# Patient Record
Sex: Female | Born: 1945
Health system: Southern US, Community
[De-identification: ages and names within clinical notes are randomized; demographics above are authoritative.]

## PROBLEM LIST (undated history)

## (undated) DIAGNOSIS — I700 Atherosclerosis of aorta: Secondary | ICD-10-CM

## (undated) DIAGNOSIS — M810 Age-related osteoporosis without current pathological fracture: Secondary | ICD-10-CM

## (undated) DIAGNOSIS — J439 Emphysema, unspecified: Secondary | ICD-10-CM

## (undated) DIAGNOSIS — T7840XA Allergy, unspecified, initial encounter: Secondary | ICD-10-CM

## (undated) DIAGNOSIS — Z8619 Personal history of other infectious and parasitic diseases: Secondary | ICD-10-CM

## (undated) HISTORY — DX: Emphysema, unspecified: J43.9

## (undated) HISTORY — PX: AUGMENTATION MAMMAPLASTY: SUR837

## (undated) HISTORY — DX: Age-related osteoporosis without current pathological fracture: M81.0

## (undated) HISTORY — DX: Allergy, unspecified, initial encounter: T78.40XA

## (undated) HISTORY — PX: BREAST BIOPSY: SHX20

## (undated) HISTORY — PX: ABDOMINAL HYSTERECTOMY: SHX81

## (undated) HISTORY — DX: Personal history of other infectious and parasitic diseases: Z86.19

## (undated) HISTORY — PX: COSMETIC SURGERY: SHX468

## (undated) HISTORY — DX: Atherosclerosis of aorta: I70.0

## (undated) HISTORY — PX: TONSILLECTOMY: SUR1361

---

## 1973-05-10 HISTORY — PX: BREAST ENHANCEMENT SURGERY: SHX7

## 1997-12-20 ENCOUNTER — Emergency Department (HOSPITAL_COMMUNITY): Admission: EM | Admit: 1997-12-20 | Discharge: 1997-12-20 | Payer: Self-pay | Admitting: Emergency Medicine

## 2000-03-08 ENCOUNTER — Other Ambulatory Visit: Admission: RE | Admit: 2000-03-08 | Discharge: 2000-03-08 | Payer: Self-pay | Admitting: Obstetrics and Gynecology

## 2001-03-20 ENCOUNTER — Other Ambulatory Visit: Admission: RE | Admit: 2001-03-20 | Discharge: 2001-03-20 | Payer: Self-pay | Admitting: Obstetrics and Gynecology

## 2001-03-31 ENCOUNTER — Encounter: Admission: RE | Admit: 2001-03-31 | Discharge: 2001-03-31 | Payer: Self-pay | Admitting: Obstetrics and Gynecology

## 2001-03-31 ENCOUNTER — Encounter: Payer: Self-pay | Admitting: Obstetrics and Gynecology

## 2001-09-13 ENCOUNTER — Encounter: Admission: RE | Admit: 2001-09-13 | Discharge: 2001-09-13 | Payer: Self-pay | Admitting: Obstetrics and Gynecology

## 2001-09-13 ENCOUNTER — Encounter: Payer: Self-pay | Admitting: Obstetrics and Gynecology

## 2002-04-09 ENCOUNTER — Other Ambulatory Visit: Admission: RE | Admit: 2002-04-09 | Discharge: 2002-04-09 | Payer: Self-pay | Admitting: Obstetrics and Gynecology

## 2004-06-24 ENCOUNTER — Other Ambulatory Visit: Admission: RE | Admit: 2004-06-24 | Discharge: 2004-06-24 | Payer: Self-pay | Admitting: Obstetrics and Gynecology

## 2005-08-12 ENCOUNTER — Other Ambulatory Visit: Admission: RE | Admit: 2005-08-12 | Discharge: 2005-08-12 | Payer: Self-pay | Admitting: Obstetrics and Gynecology

## 2008-11-16 ENCOUNTER — Emergency Department (HOSPITAL_BASED_OUTPATIENT_CLINIC_OR_DEPARTMENT_OTHER): Admission: EM | Admit: 2008-11-16 | Discharge: 2008-11-16 | Payer: Self-pay | Admitting: Emergency Medicine

## 2008-12-25 ENCOUNTER — Ambulatory Visit: Payer: Self-pay | Admitting: Internal Medicine

## 2008-12-25 DIAGNOSIS — L259 Unspecified contact dermatitis, unspecified cause: Secondary | ICD-10-CM | POA: Insufficient documentation

## 2008-12-25 DIAGNOSIS — Z87891 Personal history of nicotine dependence: Secondary | ICD-10-CM | POA: Insufficient documentation

## 2008-12-25 DIAGNOSIS — F17200 Nicotine dependence, unspecified, uncomplicated: Secondary | ICD-10-CM | POA: Insufficient documentation

## 2008-12-25 DIAGNOSIS — F3289 Other specified depressive episodes: Secondary | ICD-10-CM | POA: Insufficient documentation

## 2008-12-25 DIAGNOSIS — F329 Major depressive disorder, single episode, unspecified: Secondary | ICD-10-CM | POA: Insufficient documentation

## 2008-12-25 DIAGNOSIS — M25429 Effusion, unspecified elbow: Secondary | ICD-10-CM | POA: Insufficient documentation

## 2008-12-31 ENCOUNTER — Encounter (INDEPENDENT_AMBULATORY_CARE_PROVIDER_SITE_OTHER): Payer: Self-pay | Admitting: *Deleted

## 2009-01-06 ENCOUNTER — Encounter (INDEPENDENT_AMBULATORY_CARE_PROVIDER_SITE_OTHER): Payer: Self-pay | Admitting: *Deleted

## 2009-01-30 ENCOUNTER — Ambulatory Visit: Payer: Self-pay | Admitting: Internal Medicine

## 2009-01-31 ENCOUNTER — Telehealth (INDEPENDENT_AMBULATORY_CARE_PROVIDER_SITE_OTHER): Payer: Self-pay | Admitting: *Deleted

## 2009-07-09 ENCOUNTER — Ambulatory Visit: Payer: Self-pay | Admitting: Internal Medicine

## 2009-07-10 LAB — CONVERTED CEMR LAB: Hgb A1c MFr Bld: 5.9 % (ref 4.6–6.5)

## 2010-06-07 LAB — CONVERTED CEMR LAB
ALT: 20 units/L (ref 0–35)
AST: 19 units/L (ref 0–37)
Albumin: 4 g/dL (ref 3.5–5.2)
Alkaline Phosphatase: 95 units/L (ref 39–117)
BUN: 13 mg/dL (ref 6–23)
Basophils Absolute: 0 10*3/uL (ref 0.0–0.1)
Basophils Relative: 0 % (ref 0.0–3.0)
Bilirubin Urine: NEGATIVE
Bilirubin, Direct: 0 mg/dL (ref 0.0–0.3)
Blood in Urine, dipstick: NEGATIVE
CO2: 32 meq/L (ref 19–32)
Calcium: 9.4 mg/dL (ref 8.4–10.5)
Chloride: 106 meq/L (ref 96–112)
Cholesterol: 201 mg/dL — ABNORMAL HIGH (ref 0–200)
Creatinine, Ser: 0.7 mg/dL (ref 0.4–1.2)
Direct LDL: 109.5 mg/dL
Eosinophils Absolute: 0 10*3/uL (ref 0.0–0.7)
Eosinophils Relative: 0.3 % (ref 0.0–5.0)
GFR calc non Af Amer: 90.18 mL/min (ref >60)
Glucose, Bld: 109 mg/dL — ABNORMAL HIGH (ref 70–99)
Glucose, Urine, Semiquant: NEGATIVE
HCT: 45.9 % (ref 36.0–46.0)
HDL: 72.9 mg/dL (ref >39.00)
Hemoglobin: 15.4 g/dL — ABNORMAL HIGH (ref 12.0–15.0)
Hgb A1c MFr Bld: 6.1 % (ref 4.6–6.5)
Ketones, urine, test strip: NEGATIVE
Lymphocytes Relative: 12.5 % (ref 12.0–46.0)
Lymphs Abs: 1.5 10*3/uL (ref 0.7–4.0)
MCHC: 33.6 g/dL (ref 30.0–36.0)
MCV: 92.1 fL (ref 78.0–100.0)
Monocytes Absolute: 0.3 10*3/uL (ref 0.1–1.0)
Monocytes Relative: 2.8 % — ABNORMAL LOW (ref 3.0–12.0)
Neutro Abs: 10.1 10*3/uL — ABNORMAL HIGH (ref 1.4–7.7)
Neutrophils Relative %: 84.4 % — ABNORMAL HIGH (ref 43.0–77.0)
Nitrite: NEGATIVE
Platelets: 311 10*3/uL (ref 150.0–400.0)
Potassium: 5.1 meq/L (ref 3.5–5.1)
Protein, U semiquant: NEGATIVE
RBC: 4.98 M/uL (ref 3.87–5.11)
RDW: 14 % (ref 11.5–14.6)
Sodium: 143 meq/L (ref 135–145)
Specific Gravity, Urine: <1.005
TSH: 0.87 microintl units/mL (ref 0.35–5.50)
Total Bilirubin: 0.9 mg/dL (ref 0.3–1.2)
Total CHOL/HDL Ratio: 3
Total Protein: 7.6 g/dL (ref 6.0–8.3)
Triglycerides: 68 mg/dL (ref 0.0–149.0)
Urobilinogen, UA: 0.2
VLDL: 13.6 mg/dL (ref 0.0–40.0)
WBC Urine, dipstick: NEGATIVE
WBC: 11.9 10*3/uL — ABNORMAL HIGH (ref 4.5–10.5)
pH: 6

## 2010-06-09 NOTE — Assessment & Plan Note (Signed)
Summary: NEW PT/WANTS A CPX/UHC/KDC   Vital Signs:  Patient profile:   65 year old female Height:      67.25 inches Weight:      179.2 pounds BMI:     27.96 Temp:     97.9 degrees F oral Pulse rate:   72 / minute Resp:     14 per minute BP sitting:   112 / 74  (left arm) Cuff size:   large  Vitals Entered By: Shonna Chock (December 25, 2008 10:56 AM)  Comments REVIEWED MED LIST, PATIENT AGREED DOSE AND INSTRUCTION CORRECT    History of Present Illness: Recovering from Contact Dermatitis .  Preventive Screening-Counseling & Management  Alcohol-Tobacco     Smoking Status: current  Caffeine-Diet-Exercise     Does Patient Exercise: yes  Allergies (verified): No Known Drug Allergies  Past History:  Past Medical History: Contact Dermatitis to Lever 2000 & Latex, complicated by Staph infection (no MRSA) Depression  Past Surgical History: G2 P2; Cosmetic Surgery : Blephoroplasty, Nasal Reconstruction, Mammoplasy; Hysterectomy for fibroids Tonsillectomy  Family History: Father:DECEASED ,CAD, MI @ 70 Mother: DECEASED-UTERINE CANCER AGE 85 Siblings: 5 SISTERS (LIVING) SON: DECEASED AGE 48-MI @ 42 DAUGHTER: LIVING (ASTHMA)  Social History: Occupation:[employer]  Current Smoker:2 ppd Alcohol use-yes:occa Regular exercise-yes: push mowing Smoking Status:  current Does Patient Exercise:  yes  Review of Systems General:  Complains of sleep disorder and sweats; denies chills and fever; Weight  loss of 27# with diet. 2 benadryl at bedtime . Eyes:  Denies blurring, double vision, and vision loss-both eyes. ENT:  Denies difficulty swallowing, hoarseness, and nasal congestion. CV:  Complains of palpitations; denies chest pain or discomfort, leg cramps with exertion, shortness of breath with exertion, swelling of feet, and swelling of hands; Occa irregular beat. Resp:  Complains of cough and sputum productive; denies coughing up blood, shortness of breath, and wheezing; Scant sputum  every am. GI:  Denies abdominal pain, bloody stools, constipation, dark tarry stools, diarrhea, and indigestion; "No colonoscopy ; I just haven't scheduled one" (SOC). GU:  Denies discharge, dysuria, hematuria, and incontinence. MS:  Complains of joint redness and joint swelling; denies joint pain, low back pain, mid back pain, and thoracic pain; L elbow red & swollen X 2-3 weeks. Derm:  Complains of changes in color of skin and rash; Contact Dermatitis X 3 months from soap(Lever 2000 & latex); treated by Dr Dorita Sciara office. Neuro:  Denies headaches, numbness, and tingling. Psych:  Complains of anxiety and panic attacks; denies easily angered, easily tearful, and irritability. Endo:  Complains of heat intolerance; denies cold intolerance, excessive hunger, excessive thirst, and excessive urination. Heme:  Denies abnormal bruising and bleeding. Allergy:  Denies itching eyes, seasonal allergies, and sneezing.  Physical Exam  General:  in no acute distress; alert,appropriate and cooperative throughout examination Head:  Normocephalic and atraumatic without obvious abnormalities. No apparent alopecia . Eyes:  No corneal or conjunctival inflammation noted. Perrla. Funduscopic exam benign, without hemorrhages, exudates or papilledema.  Ears:  External ear exam shows no significant lesions or deformities.  Otoscopic examination reveals clear canals, tympanic membranes are intact bilaterally without bulging, retraction, inflammation or discharge. Hearing is grossly normal bilaterally. Nose:  External nasal examination shows no deformity or inflammation. Nasal mucosa are pink and moist without lesions or exudates. Mouth:  Oral mucosa and oropharynx without lesions or exudates.  Upper plate ; lower partial Neck:  No deformities, masses, or tenderness noted. Lungs:  Normal respiratory effort, chest expands symmetrically. Lungs  are clear to auscultation, no crackles or wheezes. BS decreased Heart:  Normal  rate and regular rhythm. S1 and S2 normal without gallop, murmur, click, rub. S4 Abdomen:  Bowel sounds positive,abdomen soft and non-tender without masses, organomegaly or hernias noted. Genitalia:  Carol Curtis,NP( Dr Arelia Sneddon) Msk:  No deformity or scoliosis noted of thoracic or lumbar spine.   Pulses:  R and L carotid,radial,dorsalis pedis and posterior tibial pulses are full and equal bilaterally Extremities:  No clubbing, cyanosis. Small effusion L elbow Neurologic:  alert & oriented X3 and DTRs symmetrical and normal.   Skin:  Erythema & hyperpigmentation Cervical Nodes:  No lymphadenopathy noted Axillary Nodes:  No palpable lymphadenopathy Psych:  memory intact for recent and remote, normally interactive, good eye contact, not anxious appearing, and not depressed appearing.     Impression & Recommendations:  Problem # 1:  HEALTH MAINTENANCE EXAM (ICD-V70.0)  Orders: UA Dipstick w/o Micro (manual) (91478) EKG w/ Interpretation (93000) Venipuncture (29562) TLB-Lipid Panel (80061-LIPID) TLB-BMP (Basic Metabolic Panel-BMET) (80048-METABOL) TLB-CBC Platelet - w/Differential (85025-CBCD) TLB-Hepatic/Liver Function Pnl (80076-HEPATIC) TLB-TSH (Thyroid Stimulating Hormone) (84443-TSH) T-2 View CXR (71020TC)  Problem # 2:  CONTACT DERMATITIS (ICD-692.9)  as per Dr Terri Piedra Her updated medication list for this problem includes:    Prednisone 5 Mg Tabs (Prednisone) .Marland Kitchen... 2 by mouth once daily (5 days left)    Triamcinolone Acetonide 0.1 % Crea (Triamcinolone acetonide) .Marland Kitchen... 1/2 with vasline and apply to affected area  Orders: Venipuncture (13086)  Problem # 3:  EFFUSION OF UPPER ARM JOINT (ICD-719.02)  Orders: T- * Misc. Laboratory test [phone])  Problem # 4:  CIGARETTE SMOKER (ICD-305.1)  Orders: T-2 View CXR (71020TC)  Problem # 5:  DEPRESSION (ICD-311)  Her updated medication list for this problem includes:    Hydroxyzine Hcl 10 Mg Tabs (Hydroxyzine hcl) .Marland Kitchen... 1 by  mouth at bedtime (itching)    Citalopram Hydrobromide 20 Mg Tabs (Citalopram hydrobromide) .Marland Kitchen... 1 once daily  Complete Medication List: 1)  Prednisone 5 Mg Tabs (Prednisone) .... 2 by mouth once daily (5 days left) 2)  Hydroxyzine Hcl 10 Mg Tabs (Hydroxyzine hcl) .Marland Kitchen.. 1 by mouth at bedtime (itching) 3)  Doxycycline Hyclate 100 Mg Tabs (Doxycycline hyclate) .Marland Kitchen.. 1 by mouth once daily (temp med) 4)  Triamcinolone Acetonide 0.1 % Crea (Triamcinolone acetonide) .... 1/2 with vasline and apply to affected area 5)  Citalopram Hydrobromide 20 Mg Tabs (Citalopram hydrobromide) .Marland Kitchen.. 1 once daily  Patient Instructions: 1)  Please report Warning Signs  related to elbow as discussed  2)  Schedule a colonoscopy as per [facility] as discussed  to help detect colon cancer. Prescriptions: CITALOPRAM HYDROBROMIDE 20 MG TABS (CITALOPRAM HYDROBROMIDE) 1 once daily  #90 x 1   Entered and Authorized by:   Marga Melnick MD   Signed by:   Marga Melnick MD on 12/25/2008   Method used:   Print then Give to Patient   RxID:   [id number]   Laboratory Results   Urine Tests    Routine Urinalysis   Color: lt. yellow Appearance: Clear Glucose: negative   (Normal Range: Negative) Bilirubin: negative   (Normal Range: Negative) Ketone: negative   (Normal Range: Negative) Spec. Gravity: <1.005   (Normal Range: 1.003-1.035) Blood: negative   (Normal Range: Negative) pH: 6.0   (Normal Range: 5.0-8.0) Protein: negative   (Normal Range: Negative) Urobilinogen: 0.2   (Normal Range: 0-1) Nitrite: negative   (Normal Range: Negative) Leukocyte Esterace: negative   (Normal Range: Negative)  Immunization History:  Tetanus/Td Immunization History:    Tetanus/Td:  tdap (11/07/2008)   Appended Document: NEW PT/WANTS A CPX/UHC/KDC After Betadine & alcohol cleaning & local Lidocaine  1% w/o; 1.5 cc dark yellow fluid aspirated from L elbow w/o complication. Sent for C&S.

## 2010-06-09 NOTE — Letter (Signed)
Summary: Results Follow up Letter  [facility] [place]  [address] [place], [place] [address]   Phone: [phone]  Fax: [phone]    01/06/2009 MRN: [id number]  Kelly Henderson [address] [place], [place]  [address]  Dear Ms. Mierzwa,  The following are the results of your recent test(s):  Test         Result    Pap Smear:        Normal _____  Not Normal _____ Comments: ______________________________________________________ Cholesterol: LDL(Bad cholesterol):         Your goal is less than:         HDL (Good cholesterol):       Your goal is more than: Comments:  ______________________________________________________ Mammogram:        Normal _____  Not Normal _____ Comments:  ___________________________________________________________________ Hemoccult:        Normal _____  Not normal _______ Comments:    _____________________________________________________________________ Other Tests: Please see culture results from 12/25/2008    We routinely do not discuss normal results over the telephone.  If you desire a copy of the results, or you have any questions about this information we can discuss them at your next office visit.   Sincerely,

## 2010-06-09 NOTE — Letter (Signed)
Summary: Results Follow up Letter  [facility] [place]  [address] [place], [place] [address]   Phone: [phone]  Fax: [phone]    12/31/2008 MRN: [id number]  Kelly Henderson [address] [place], [place]  [address]  Dear [name],  The following are the results of your recent test(s):  Test         Result    Pap Smear:        Normal _____  Not Normal _____ Comments: ______________________________________________________ Cholesterol: LDL(Bad cholesterol):         Your goal is less than:         HDL (Good cholesterol):       Your goal is more than: Comments:  ______________________________________________________ Mammogram:        Normal _____  Not Normal _____ Comments:  ___________________________________________________________________ Hemoccult:        Normal _____  Not normal _______ Comments:    _____________________________________________________________________ Other Tests: Please see attached labs done on 12/25/2008    We routinely do not discuss normal results over the telephone.  If you desire a copy of the results, or you have any questions about this information we can discuss them at your next office visit.   Sincerely, Chrae Malloy  December 31, 2008 9:27 AM

## 2010-06-09 NOTE — Progress Notes (Signed)
Summary: chest  results  Phone Note Outgoing Call   Summary of Call: left message to call  office......Marland KitchenFelecia Deloach CMA  January 31, 2009 3:13 PM   Chronic Obstructive Pulmonary Disease(emphysema, bronchitis) findings. Smoking cessation would result in slowing progression of COPD(emphysema is normal aging lung process which smoking accelerates & makes more severe). Please see me if you wish to discuss all options. Hopp   Follow-up for Phone Call        pt aware...............Marland KitchenFelecia Deloach CMA  January 31, 2009 3:17 PM

## 2013-04-10 ENCOUNTER — Emergency Department (HOSPITAL_BASED_OUTPATIENT_CLINIC_OR_DEPARTMENT_OTHER)
Admission: EM | Admit: 2013-04-10 | Discharge: 2013-04-10 | Disposition: A | Discharge status: Home / Self Care | Payer: Medicare Other | Attending: Emergency Medicine | Admitting: Emergency Medicine

## 2013-04-10 ENCOUNTER — Encounter (HOSPITAL_BASED_OUTPATIENT_CLINIC_OR_DEPARTMENT_OTHER): Payer: Self-pay | Admitting: Emergency Medicine

## 2013-04-10 DIAGNOSIS — Z79899 Other long term (current) drug therapy: Secondary | ICD-10-CM | POA: Insufficient documentation

## 2013-04-10 DIAGNOSIS — Y929 Unspecified place or not applicable: Secondary | ICD-10-CM | POA: Insufficient documentation

## 2013-04-10 DIAGNOSIS — S90569A Insect bite (nonvenomous), unspecified ankle, initial encounter: Secondary | ICD-10-CM | POA: Insufficient documentation

## 2013-04-10 DIAGNOSIS — Y9389 Activity, other specified: Secondary | ICD-10-CM | POA: Insufficient documentation

## 2013-04-10 DIAGNOSIS — F17200 Nicotine dependence, unspecified, uncomplicated: Secondary | ICD-10-CM | POA: Insufficient documentation

## 2013-04-10 DIAGNOSIS — W57XXXA Bitten or stung by nonvenomous insect and other nonvenomous arthropods, initial encounter: Secondary | ICD-10-CM

## 2013-04-10 MED ORDER — DOXYCYCLINE HYCLATE 100 MG PO CAPS: 100.0000 mg | ORAL_CAPSULE | Freq: Two times a day (BID) | ORAL | Status: DC

## 2013-04-10 NOTE — ED Provider Notes (Signed)
CSN: [id number]     Arrival date & time 04/10/13  1917 History   First MD Initiated Contact with Patient 04/10/13 1954     Chief Complaint  Patient presents with  . Tick Removal   (Consider location/radiation/quality/duration/timing/severity/associated sxs/prior Treatment) HPI Comments: Pt reports she found a tick on right leg.  Unable to remove at home.   Red area around tick  The history is provided by the patient. No language interpreter was used.    History reviewed. No pertinent past medical history. Past Surgical History  Procedure Laterality Date  . Abdominal hysterectomy    . Tonsillectomy     No family history on file. History  Substance Use Topics  . Smoking status: Current Every Day Smoker -- 2.00 packs/day  . Smokeless tobacco: Not on file  . Alcohol Use: No   OB History   Grav Para Term Preterm Abortions TAB SAB Ect Mult Living                 Review of Systems  Skin: Positive for color change.  All other systems reviewed and are negative.    Allergies  Morphine and related  Home Medications   Current Outpatient Rx  Name  Route  Sig  Dispense  Refill  . diphenhydrAMINE (SOMINEX) 25 MG tablet   Oral   Take 50 mg by mouth at bedtime.         Marland Kitchen doxycycline (VIBRAMYCIN) 100 MG capsule   Oral   Take 1 capsule (100 mg total) by mouth 2 (two) times daily.   20 capsule   0    BP 105/89  Pulse 96  Temp(Src) 98.1 F (36.7 C) (Oral)  Resp 20  Ht 5\' 7"  (1.702 m)  Wt 186 lb (84.369 kg)  BMI 29.12 kg/m2  SpO2 98% Physical Exam  Nursing note and vitals reviewed. Constitutional: She is oriented to person, place, and time. She appears well-developed and well-nourished.  Musculoskeletal:  Tick right anterior leg  Neurological: She is alert and oriented to person, place, and time. She has normal reflexes.  Skin: There is erythema.  Psychiatric: She has a normal mood and affect.    ED Course  Procedures (including critical care time) Labs  Review Labs Reviewed - No data to display Imaging Review No results found.  EKG Interpretation   None       MDM   1. Tick bite    I removed tick with forcep,  Small tick broke into pieces,  I used ophthalmoscope to maginfy to look for embedded pieces,  None seen.   Pt has 1/2 dollar size area of redness.   Pt placed on doxycycline.   Pt advised to return if any problems.    Lonia Skinner [place], PA-C 04/10/13 2128

## 2013-04-10 NOTE — ED Notes (Signed)
Found tick on right leg just above knee while showering tonight. Completely embedded head.

## 2013-04-10 NOTE — Discharge Instructions (Signed)
Tick Bite Information  Ticks are insects that attach themselves to the skin and draw blood for food. There are various types of ticks. Common types include wood ticks and deer ticks. Most ticks live in shrubs and grassy areas. Ticks can climb onto your body when you make contact with leaves or grass where the tick is waiting. The most common places on the body for ticks to attach themselves are the scalp, neck, armpits, waist, and groin.  Most tick bites are harmless, but sometimes ticks carry germs that cause diseases. These germs can be spread to a person during the tick's feeding process. The chance of a disease spreading through a tick bite depends on:    The type of tick.   Time of year.    How long the tick is attached.    Geographic location.   HOW CAN YOU PREVENT TICK BITES?  Take these steps to help prevent tick bites when you are outdoors:   Wear protective clothing. Long sleeves and long pants are best.    Wear white clothes so you can see ticks more easily.   Tuck your pant legs into your socks.    If walking on a trail, stay in the middle of the trail to avoid brushing against bushes.   Avoid walking through areas with long grass.   Put insect repellent on all exposed skin and along boot tops, pant legs, and sleeve cuffs.    Check clothing, hair, and skin repeatedly and before going inside.    Brush off any ticks that are not attached.   Take a shower or bath as soon as possible after being outdoors.   WHAT IS THE PROPER WAY TO REMOVE A TICK?  Ticks should be removed as soon as possible to help prevent diseases caused by tick bites.  1. If latex gloves are available, put them on before trying to remove a tick.   2. Using fine-point tweezers, grasp the tick as close to the skin as possible. You may also use curved forceps or a tick removal tool. Grasp the tick as close to its head as possible. Avoid grasping the tick on its body.  3. Pull gently with steady upward pressure until  the tick lets go. Do not twist the tick or jerk it suddenly. This may break off the tick's head or mouth parts.   4. Do not squeeze or crush the tick's body. This could force disease-carrying fluids from the tick into your body.   5. After the tick is removed, wash the bite area and your hands with soap and water or other disinfectant such as alcohol.  6. Apply a small amount of antiseptic cream or ointment to the bite site.   7. Wash and disinfect any instruments that were used.   Do not try to remove a tick by applying a hot match, petroleum jelly, or fingernail polish to the tick. These methods do not work and may increase the chances of disease being spread from the tick bite.   WHEN SHOULD YOU SEEK MEDICAL CARE?  Contact your health care provider if you are unable to remove a tick from your skin or if a part of the tick breaks off and is stuck in the skin.   After a tick bite, you need to be aware of signs and symptoms that could be related to diseases spread by ticks. Contact your health care provider if you develop any of the following in the days or weeks after   the tick bite:   Unexplained fever.   Rash. A circular rash that appears days or weeks after the tick bite may indicate the possibility of Lyme disease. The rash may resemble a target with a bull's-eye and may occur at a different part of your body than the tick bite.   Redness and swelling in the area of the tick bite.    Tender, swollen lymph glands.    Diarrhea.    Weight loss.    Cough.    Fatigue.    Muscle, joint, or bone pain.    Abdominal pain.    Headache.    Lethargy or a change in your level of consciousness.   Difficulty walking or moving your legs.    Numbness in the legs.    Paralysis.   Shortness of breath.    Confusion.    Repeated vomiting.   Document Released: 04/23/2000 Document Revised: 12/27/2012 Document Reviewed: 10/04/2012  [employer] Patient Information 2014 [employer].

## 2013-04-11 NOTE — ED Provider Notes (Signed)
Medical screening examination/treatment/procedure(s) were performed by non-physician practitioner and as supervising physician I was immediately available for consultation/collaboration.  EKG Interpretation   None        Shon Baton, MD 04/11/13 [phone]

## 2015-04-12 ENCOUNTER — Emergency Department (HOSPITAL_COMMUNITY)
Admission: EM | Admit: 2015-04-12 | Discharge: 2015-04-12 | Disposition: A | Discharge status: Home / Self Care | Payer: Medicare (Managed Care) | Source: Home / Self Care | Attending: Family Medicine | Admitting: Family Medicine

## 2015-04-12 ENCOUNTER — Encounter (HOSPITAL_COMMUNITY): Payer: Self-pay | Admitting: Emergency Medicine

## 2015-04-12 DIAGNOSIS — H1033 Unspecified acute conjunctivitis, bilateral: Secondary | ICD-10-CM

## 2015-04-12 MED ORDER — POLYMYXIN B-TRIMETHOPRIM 10000-0.1 UNIT/ML-% OP SOLN: 1.0000 [drp] | OPHTHALMIC | Status: DC

## 2015-04-12 NOTE — ED Notes (Signed)
Reports both eyes red, watery.  Started yesterday and today includes both eyes

## 2015-04-12 NOTE — ED Provider Notes (Signed)
CSN: [id number]     Arrival date & time 04/12/15  1303 History   First MD Initiated Contact with Patient 04/12/15 1401     Chief Complaint  Patient presents with  . Eye Problem   (Consider location/radiation/quality/duration/timing/severity/associated sxs/prior Treatment) HPI Comments: 69 year old female complaining of bilateral eye redness and itching with watery discharge in [place] day. She states this morning her eyes were added shut. There is minor swelling of the lower eyelids. Currently there is no drainage. No purulence. Denies other URI or allergy symptoms.   No past medical history on file. Past Surgical History  Procedure Laterality Date  . Abdominal hysterectomy    . Tonsillectomy     No family history on file. Social History  Substance Use Topics  . Smoking status: Current Every Day Smoker -- 2.00 packs/day  . Smokeless tobacco: Not on file  . Alcohol Use: No   OB History    No data available     Review of Systems  Constitutional: Negative for fever, chills, activity change and fatigue.  HENT: Negative for congestion, ear pain, rhinorrhea, sneezing and sore throat.   Eyes: Positive for discharge, redness and itching. Negative for photophobia, pain and visual disturbance.  Respiratory: Negative.     Allergies  Morphine and related  Home Medications   Prior to Admission medications   Medication Sig Start Date End Date Taking? Authorizing Provider  diphenhydrAMINE (SOMINEX) 25 MG tablet Take 50 mg by mouth at bedtime.    Historical Provider, MD  doxycycline (VIBRAMYCIN) 100 MG capsule Take 1 capsule (100 mg total) by mouth 2 (two) times daily. 04/10/13   Fransico Meadow, PA-C  trimethoprim-polymyxin b (POLYTRIM) ophthalmic solution Place 1 drop into both eyes every 4 (four) hours. 04/12/15   Janne Napoleon, NP   Meds Ordered and Administered this Visit  Medications - No data to display  BP 128/82 mmHg  Pulse 75  Temp(Src) 97.9 F (36.6 C) (Oral)  SpO2 96% No  data found.   Physical Exam  Constitutional: She is oriented to person, place, and time. She appears well-developed and well-nourished.  HENT:  Mouth/Throat: No oropharyngeal exudate.  Minor erythema of the posterior pharynx. No exudates or swelling.  Eyes: EOM are normal. Pupils are equal, round, and reactive to light.  Bilateral lower conjunctival erythema and minor swelling. No swelling of the upper lids. No other. Orbital edema. Sclera mildly injected. No purulent drainage or other drainage noted at this time.  Neck: Normal range of motion. Neck supple.  Cardiovascular: Normal rate.   Pulmonary/Chest: Effort normal. No respiratory distress.  Lymphadenopathy:    She has no cervical adenopathy.  Neurological: She is alert and oriented to person, place, and time. She exhibits normal muscle tone.  Skin: Skin is warm and dry.  Psychiatric: She has a normal mood and affect.  Nursing note and vitals reviewed.   ED Course  Procedures (including critical care time)  Labs Review Labs Reviewed - No data to display  Imaging Review No results found.   Visual Acuity Review  Right Eye Distance:   Left Eye Distance:   Bilateral Distance:    Right Eye Near:   Left Eye Near:    Bilateral Near:         MDM   1. Conjunctivitis, acute, bilateral    Likely viral Use Zaditor eye drops twice a day as needed Only use the antibacterial eye drops for worsening, white draining pus, increased swelling .     Shanon Brow  Donnell Beauchamp, NP 04/12/15 1414

## 2015-04-12 NOTE — Discharge Instructions (Signed)
Bacterial Conjunctivitis Likely viral Use Zaditor eye drops twice a day as needed Only use the antibacterial eye drops for worsening, white draining pus, increased swelling . Bacterial conjunctivitis, commonly called pink eye, is an inflammation of the clear membrane that covers the white part of the eye (conjunctiva). The inflammation can also happen on the underside of the eyelids. The blood vessels in the conjunctiva become inflamed, causing the eye to become red or pink. Bacterial conjunctivitis may spread easily from one eye to another and from person to person (contagious).  CAUSES  Bacterial conjunctivitis is caused by bacteria. The bacteria may come from your own skin, your upper respiratory tract, or from someone else with bacterial conjunctivitis. SYMPTOMS  The normally white color of the eye or the underside of the eyelid is usually pink or red. The pink eye is usually associated with irritation, tearing, and some sensitivity to light. Bacterial conjunctivitis is often associated with a thick, yellowish discharge from the eye. The discharge may turn into a crust on the eyelids overnight, which causes your eyelids to stick together. If a discharge is present, there may also be some blurred vision in the affected eye. DIAGNOSIS  Bacterial conjunctivitis is diagnosed by your caregiver through an eye exam and the symptoms that you report. Your caregiver looks for changes in the surface tissues of your eyes, which may point to the specific type of conjunctivitis. A sample of any discharge may be collected on a cotton-tip swab if you have a severe case of conjunctivitis, if your cornea is affected, or if you keep getting repeat infections that do not respond to treatment. The sample will be sent to a lab to see if the inflammation is caused by a bacterial infection and to see if the infection will respond to antibiotic medicines. TREATMENT  1. Bacterial conjunctivitis is treated with antibiotics.  Antibiotic eyedrops are most often used. However, antibiotic ointments are also available. Antibiotics pills are sometimes used. Artificial tears or eye washes may ease discomfort. HOME CARE INSTRUCTIONS  1. To ease discomfort, apply a cool, clean washcloth to your eye for 10-20 minutes, 3-4 times a day. 2. Gently wipe away any drainage from your eye with a warm, wet washcloth or a cotton ball. 3. Wash your hands often with soap and water. Use paper towels to dry your hands. 4. Do not share towels or washcloths. This may spread the infection. 5. Change or wash your pillowcase every day. 6. You should not use eye makeup until the infection is gone. 7. Do not operate machinery or drive if your vision is blurred. 8. Stop using contact lenses. Ask your caregiver how to sterilize or replace your contacts before using them again. This depends on the type of contact lenses that you use. 9. When applying medicine to the infected eye, do not touch the edge of your eyelid with the eyedrop bottle or ointment tube. SEEK IMMEDIATE MEDICAL CARE IF:   Your infection has not improved within 3 days after beginning treatment.  You had yellow discharge from your eye and it returns.  You have increased eye pain.  Your eye redness is spreading.  Your vision becomes blurred.  You have a fever or persistent symptoms for more than 2-3 days.  You have a fever and your symptoms suddenly get worse.  You have facial pain, redness, or swelling. MAKE SURE YOU:   Understand these instructions.  Will watch your condition.  Will get help right away if you are not doing  well or get worse.   This information is not intended to replace advice given to you by your health care provider. Make sure you discuss any questions you have with your health care provider.   Document Released: 04/26/2005 Document Revised: 05/17/2014 Document Reviewed: 09/27/2011 Elsevier Interactive Patient Education 2016 [employer].  How to Use Eye Drops and Eye Ointments HOW TO APPLY EYE DROPS Follow these steps when applying eye drops: 2. Wash your hands. 3. Tilt your head back. 4. Put a finger under your eye and use it to gently pull your lower lid downward. Keep that finger in place. 5. Using your other hand, hold the dropper between your thumb and index finger. 6. Position the dropper just over the edge of the lower lid. Hold it as close to your eye as you can without touching the dropper to your eye. 7. Steady your hand. One way to do this is to lean your index finger against your brow. 8. Look up. 9. Slowly and gently squeeze one drop of medicine into your eye. 10. Close your eye. 11. Place a finger between your lower eyelid and your nose. Press gently for 2 minutes. This increases the amount of time that the medicine is exposed to the eye. It also reduces side effects that can develop if the drop gets into the bloodstream through the nose. HOW TO APPLY EYE OINTMENTS Follow these steps when applying eye ointments: 10. Wash your hands. 11. Put a finger under your eye and use it to gently pull your lower lid downward. Keep that finger in place. 12. Using your other hand, place the tip of the tube between your thumb and index finger with the remaining fingers braced against your cheek or nose. 13. Hold the tube just over the edge of your lower lid without touching the tube to your lid or eyeball. 14. Look up. 15. Line the inner part of your lower lid with ointment. 16. Gently pull up on your upper lid and look down. This will force the ointment to spread over the surface of the eye. 17. Release the upper lid. 18. If you can, close your eyes for 1-2 minutes. Do not rub your eyes. If you applied the ointment correctly, your vision will be blurry for a few minutes. This is normal. ADDITIONAL INFORMATION  Make sure to use the eye drops or ointment as told by your health care provider.  If you have been told  to use both eye drops and an eye ointment, apply the eye drops first, then wait 3-4 minutes before you apply the ointment.  Try not to touch the tip of the dropper or tube to your eye. A dropper or tube that has touched the eye can become contaminated.   This information is not intended to replace advice given to you by your health care provider. Make sure you discuss any questions you have with your health care provider.   Document Released: 08/02/2000 Document Revised: 09/10/2014 Document Reviewed: 04/22/2014 Elsevier Interactive Patient Education [employer].

## 2016-08-19 ENCOUNTER — Encounter: Payer: Self-pay | Admitting: Family Medicine

## 2016-08-19 ENCOUNTER — Other Ambulatory Visit (INDEPENDENT_AMBULATORY_CARE_PROVIDER_SITE_OTHER): Payer: Medicare HMO

## 2016-08-19 ENCOUNTER — Ambulatory Visit (INDEPENDENT_AMBULATORY_CARE_PROVIDER_SITE_OTHER): Payer: Medicare HMO | Admitting: Family Medicine

## 2016-08-19 VITALS — BP 120/80 | HR 85 | Temp 97.8°F | Ht 66.25 in | Wt 180.4 lb

## 2016-08-19 DIAGNOSIS — R7309 Other abnormal glucose: Secondary | ICD-10-CM | POA: Diagnosis not present

## 2016-08-19 DIAGNOSIS — Z1159 Encounter for screening for other viral diseases: Secondary | ICD-10-CM

## 2016-08-19 DIAGNOSIS — E2839 Other primary ovarian failure: Secondary | ICD-10-CM | POA: Diagnosis not present

## 2016-08-19 DIAGNOSIS — Z23 Encounter for immunization: Secondary | ICD-10-CM

## 2016-08-19 DIAGNOSIS — Z0001 Encounter for general adult medical examination with abnormal findings: Secondary | ICD-10-CM

## 2016-08-19 DIAGNOSIS — Z122 Encounter for screening for malignant neoplasm of respiratory organs: Secondary | ICD-10-CM

## 2016-08-19 DIAGNOSIS — Z1239 Encounter for other screening for malignant neoplasm of breast: Secondary | ICD-10-CM

## 2016-08-19 DIAGNOSIS — Z1211 Encounter for screening for malignant neoplasm of colon: Secondary | ICD-10-CM

## 2016-08-19 DIAGNOSIS — Z0000 Encounter for general adult medical examination without abnormal findings: Secondary | ICD-10-CM

## 2016-08-19 DIAGNOSIS — L299 Pruritus, unspecified: Secondary | ICD-10-CM

## 2016-08-19 LAB — COMPREHENSIVE METABOLIC PANEL
ALT: 20 U/L (ref 0–35)
AST: 17 U/L (ref 0–37)
Albumin: 4.1 g/dL (ref 3.5–5.2)
Alkaline Phosphatase: 90 U/L (ref 39–117)
BUN: 13 mg/dL (ref 6–23)
CO2: 32 mEq/L (ref 19–32)
Calcium: 9.6 mg/dL (ref 8.4–10.5)
Chloride: 101 mEq/L (ref 96–112)
Creatinine, Ser: 0.74 mg/dL (ref 0.40–1.20)
GFR: 82.6 mL/min (ref >60.00)
Glucose, Bld: 103 mg/dL — ABNORMAL HIGH (ref 70–99)
Potassium: 4.7 meq/L (ref 3.5–5.1)
Sodium: 138 meq/L (ref 135–145)
Total Bilirubin: 0.5 mg/dL (ref 0.2–1.2)
Total Protein: 7.4 g/dL (ref 6.0–8.3)

## 2016-08-19 LAB — LIPID PANEL
Cholesterol: 210 mg/dL — ABNORMAL HIGH (ref 0–200)
HDL: 65.6 mg/dL (ref >39.00)
LDL Cholesterol: 125 mg/dL — ABNORMAL HIGH (ref 0–99)
NonHDL: 144.81
Total CHOL/HDL Ratio: 3
Triglycerides: 97 mg/dL (ref 0.0–149.0)
VLDL: 19.4 mg/dL (ref 0.0–40.0)

## 2016-08-19 LAB — CBC
HCT: 44.7 % (ref 36.0–46.0)
Hemoglobin: 14.6 g/dL (ref 12.0–15.0)
MCHC: 32.6 g/dL (ref 30.0–36.0)
MCV: 89.6 fl (ref 78.0–100.0)
Platelets: 233 10*3/uL (ref 150.0–400.0)
RBC: 4.99 Mil/uL (ref 3.87–5.11)
RDW: 14.3 % (ref 11.5–15.5)
WBC: 5.4 10*3/uL (ref 4.0–10.5)

## 2016-08-19 LAB — HEMOGLOBIN A1C: Hgb A1c MFr Bld: 6 % (ref 4.6–6.5)

## 2016-08-19 NOTE — Patient Instructions (Addendum)
Aim to do some physical exertion for 150 minutes per week. This is typically divided into 5 days per week, 30 minutes per day. The activity should be enough to get your heart rate up. Anything is better than nothing if you have time constraints.  Try taking OTC Zyrtec 10 mg daily for itching.

## 2016-08-19 NOTE — Progress Notes (Signed)
Pre visit review using our clinic review tool, if applicable. No additional management support is needed unless otherwise documented below in the visit note. 

## 2016-08-19 NOTE — Addendum Note (Signed)
Addended by: Harl Bowie on: 08/19/2016 11:24 AM   Modules accepted: Orders

## 2016-08-19 NOTE — Progress Notes (Signed)
Chief Complaint  Patient presents with  . Establish Care    pt requesting CPE-pt had only coffee this am     Well Woman Kelly Henderson is here for a complete physical.   Her last physical was >1 year ago.  Current diet: in general, a "healthy" diet  . Current exercise: Active at work, no dedicated exercise- going to join the [employer]. Weight is stable and she denies daytime fatigue. No LMP recorded. Patient has had a hysterectomy..  Seatbelt? Yes   Health Maintenance Colonoscopy- No Shingrix- No  Lung cancer screening- No DEXA- No Mammogram- 2000 Tetanus- Yes - 11/2008 Pneumonia- No Hep C- Yes   Patient also has a many year history of dryness over the rectal area and in between her thighs. She's tried remedies such as lotions, witch hazel, and other various topicals without relief. She notices it most after she takes a shower before bed. It will wake her up at night.  Past Medical History:  Diagnosis Date  . History of chicken pox   . History of shingles     Past Surgical History:  Procedure Laterality Date  . ABDOMINAL HYSTERECTOMY    . BREAST BIOPSY     Patient has had 3 biopsy  . BREAST ENHANCEMENT SURGERY  1975  . TONSILLECTOMY     Medications  Current Outpatient Prescriptions on File Prior to Visit  Medication Sig Dispense Refill  . diphenhydrAMINE (SOMINEX) 25 MG tablet Take 50 mg by mouth at bedtime.     Allergies Allergies  Allergen Reactions  . Morphine And Related Swelling    Review of Systems: Constitutional:  no unexpected change in weight, no weakness, no unexplained fevers, sweats, or chills Eye:  no recent significant change in vision Ear/Nose/Mouth/Throat:  Ears:  no tinnitus or vertigo and no recent change in hearing, Nose/Mouth/Throat:  no complaints of nasal congestion or discharge, no sore throat and no recent change in voice or hoarseness Cardiovascular:  no exercise intolerance, no chest pain, no palpitations Respiratory:  no chronic  cough, sputum, or hemoptysis and no shortness of breath Gastrointestinal:  no abdominal pain, no change in bowel habits, no significant change in appetite, no nausea, vomiting, diarrhea, or constipation and no black or bloody stool GU:  Female: negative for dysuria, frequency, and incontinence, Normal menses; no abnormal bleeding, pelvic pain, or discharge Musculoskeletal/Extremities:  no pain, redness, or swelling of the joints Integumentary (Skin/Breast):  +itching as noted in HPI, otherwise no abnormal skin lesions reported, no new breast lumps or masses Neurologic:  no chronic headaches, no numbness, tingling, or tremor Psychiatric:  no anxiety, no depression Endocrine:  denies fatigue, weight changes, heat/cold intolerance, bowel or skin changes, or cardiovascular system symptoms Hematologic/Lymphatic:  no abnormal bleeding, no HIV risk factors, no night sweats, no swollen nodes, no weight loss Allergic/Immunologic:  no history of food or environmental allergies  Exam BP 120/80 (BP Location: Left Arm, Patient Position: Sitting, Cuff Size: Normal)   Pulse 85   Temp 97.8 F (36.6 C) (Oral)   Ht 5' 6.25" (1.683 m)   Wt 180 lb 6.4 oz (81.8 kg)   SpO2 95%   BMI 28.90 kg/m  General:  well developed, well nourished, in no apparent distress Skin:  no significant moles, warts, or growths Head:  no masses, lesions, or tenderness Eyes:  pupils equal and round, sclera anicteric without injection Ears:  canals without lesions, TMs shiny without retraction, no obvious effusion, no erythema Nose:  nares patent, septum  midline, mucosa normal, and no drainage or sinus tenderness Throat/Pharynx:  lips and gingiva without lesion; tongue and uvula midline; non-inflamed pharynx; no exudates or postnasal drainage Neck: neck supple without adenopathy, thyromegaly, or masses Breasts:  inspection negative, no nipple discharge or bleeding, no masses or nodularity palpable Thorax:  nontender Lungs:  clear  to auscultation, breath sounds equal bilaterally, no respiratory distress Cardio:  regular rate and rhythm without murmurs, heart sounds without clicks or rubs, point of maximal impulse normal; no lifts, heaves, or thrills Abdomen:  abdomen soft, nontender; bowel sounds normal; no masses or organomegaly Genital: Not done Musculoskeletal:  symmetrical muscle groups noted without atrophy or deformity Extremities:  no clubbing, cyanosis, or edema, no deformities, no skin discoloration Neuro:  gait normal; deep tendon reflexes normal and symmetric Psych: well oriented with normal range of affect and appropriate judgment/insight  Assessment and Plan  Well adult exam - Plan: Comprehensive metabolic panel, Lipid panel  Encounter for screening for lung cancer - Plan: CT CHEST LUNG CANCER SCREENING LOW DOSE WO CONTRAST  Need for hepatitis C screening test  Screen for colon cancer - Plan: Ambulatory referral to Gastroenterology  Estrogen deficiency - Plan: DG Bone Density  Screening for malignant neoplasm of breast - Plan: MM DIGITAL SCREENING BILATERAL  Pruritus - Plan: CBC   Well 71 y.o. female. Have a lot to catch up on for health maintenance. Counseled on diet and exercise. She has done well to stop smoking. Zyrtec for itching, will revisit in 4 weeks. Other orders as above. Follow up in 4 weeks. The patient voiced understanding and agreement to the plan.  Herrin, DO 08/19/16 10:48 AM

## 2016-08-31 ENCOUNTER — Ambulatory Visit (HOSPITAL_BASED_OUTPATIENT_CLINIC_OR_DEPARTMENT_OTHER)
Admission: RE | Admit: 2016-08-31 | Discharge: 2016-08-31 | Disposition: A | Discharge status: Home / Self Care | Payer: Medicare HMO | Source: Ambulatory Visit | Attending: Family Medicine | Admitting: Family Medicine

## 2016-08-31 ENCOUNTER — Other Ambulatory Visit: Payer: Self-pay | Admitting: Family Medicine

## 2016-08-31 ENCOUNTER — Encounter (HOSPITAL_BASED_OUTPATIENT_CLINIC_OR_DEPARTMENT_OTHER): Payer: Self-pay

## 2016-08-31 DIAGNOSIS — Z136 Encounter for screening for cardiovascular disorders: Secondary | ICD-10-CM | POA: Diagnosis not present

## 2016-08-31 DIAGNOSIS — Z13820 Encounter for screening for osteoporosis: Secondary | ICD-10-CM | POA: Insufficient documentation

## 2016-08-31 DIAGNOSIS — Z1239 Encounter for other screening for malignant neoplasm of breast: Secondary | ICD-10-CM

## 2016-08-31 DIAGNOSIS — Z1231 Encounter for screening mammogram for malignant neoplasm of breast: Secondary | ICD-10-CM | POA: Insufficient documentation

## 2016-08-31 DIAGNOSIS — M810 Age-related osteoporosis without current pathological fracture: Secondary | ICD-10-CM | POA: Diagnosis not present

## 2016-08-31 DIAGNOSIS — Z122 Encounter for screening for malignant neoplasm of respiratory organs: Secondary | ICD-10-CM | POA: Diagnosis not present

## 2016-08-31 DIAGNOSIS — E2839 Other primary ovarian failure: Secondary | ICD-10-CM

## 2016-08-31 DIAGNOSIS — Z87891 Personal history of nicotine dependence: Secondary | ICD-10-CM | POA: Diagnosis not present

## 2016-09-15 NOTE — Progress Notes (Signed)
Pre visit review using our clinic review tool, if applicable. No additional management support is needed unless otherwise documented below in the visit note. 

## 2016-09-15 NOTE — Progress Notes (Addendum)
Subjective:   Kelly Henderson is a 71 y.o. female who presents for an Initial Medicare Annual Wellness Visit.  Review of Systems    No ROS.  Medicare Wellness Visit. Cardiac Risk Factors include: advanced age (>42men, >91 women) Sleep patterns: Takes benadryl to sleep. Sleeps well usually. Currently separated from husband and has trouble "turning her mind off sometimes". Home Safety/Smoke Alarms:  Feels safe in home. Smoke alarms in place.  Living environment; residence and Firearm Safety: Lives alone with dog. Currently living separate from husband, but hopes that changes soon. Guns safely stored. Seat Belt Safety/Bike Helmet: Wears seat belt.   Counseling:   Eye Exam- Wears glasses. Dr.Allen yearly. Dental- Dr.Sanders every 6 months.  Female:   Pap- Hysterectomy    Mammo- Last 08/31/16: BI-RADS CATEGORY  1:  Negative. Dexa scan-  Last 08/31/16: osteoporosis.      CCS-Pt declines scheduling today, but states she will soon.     Objective:    Today's Vitals   09/16/16 0940  BP: 110/82  Pulse: 74  Temp: 97.9 F (36.6 C)  TempSrc: Oral  SpO2: 94%  Weight: 184 lb 6.4 oz (83.6 kg)  Height: 5' 6.25" (1.683 m)   Body mass index is 29.54 kg/m.   Current Medications (verified) Outpatient Encounter Prescriptions as of 09/16/2016  Medication Sig  . diphenhydrAMINE (BENADRYL) 25 MG tablet Take 2 tablet by mouth at bedtime.  Marland Kitchen alendronate (FOSAMAX) 70 MG tablet Take 1 tablet (70 mg total) by mouth once a week. Take with a full glass of water on an empty stomach.  . [DISCONTINUED] diphenhydrAMINE (SOMINEX) 25 MG tablet Take 50 mg by mouth at bedtime.   No facility-administered encounter medications on file as of 09/16/2016.     Allergies (verified) Morphine and related   History: Past Medical History:  Diagnosis Date  . History of chicken pox   . History of shingles   . Osteoporosis    Past Surgical History:  Procedure Laterality Date  . ABDOMINAL HYSTERECTOMY    .  AUGMENTATION MAMMAPLASTY    . BREAST BIOPSY     Patient has had 3 biopsy  . BREAST ENHANCEMENT SURGERY  1975  . TONSILLECTOMY     Family History  Problem Relation Age of Onset  . Cancer Mother        Uterus  . Alcohol abuse Father   . Heart disease Father   . Heart attack Son    Social History   Occupational History  . Not on file.   Social History Main Topics  . Smoking status: Former Smoker    Packs/day: 2.00    Years: 50.00    Types: Cigarettes    Quit date: 07/01/2016  . Smokeless tobacco: Never Used  . Alcohol use No  . Drug use: No  . Sexual activity: Yes    Birth control/ protection: Surgical    Tobacco Counseling Counseling given: Not Answered   Activities of Daily Living In your present state of health, do you have any difficulty performing the following activities: 09/16/2016 08/19/2016  Hearing? N N  Vision? N N  Difficulty concentrating or making decisions? N N  Walking or climbing stairs? N N  Dressing or bathing? N N  Doing errands, shopping? N N  Preparing Food and eating ? N -  Using the Toilet? N -  In the past six months, have you accidently leaked urine? N -  Do you have problems with loss of bowel control? N -  Managing your Medications? N -  Managing your Finances? N -  Housekeeping or managing your Housekeeping? N -  Some recent data might be hidden    Immunizations and Health Maintenance Immunization History  Administered Date(s) Administered  . Pneumococcal Conjugate-13 08/19/2016  . Td 11/07/2008  . Zoster Recombinat (Shingrix) 08/19/2016   Health Maintenance Due  Topic Date Due  . COLONOSCOPY  03/09/1997    Patient Care Team: Shelda Pal, DO as PCP - General (Family Medicine)  Indicate any recent Medical Services you may have received from other than [facility] providers in the past year (date may be approximate).     Assessment:   This is a routine wellness examination for [facility]. Physical assessment deferred to  PCP.   Hearing/Vision screen  Visual Acuity Screening   Right eye Left eye Both eyes  Without correction: 20/20 20/20 20/20   With correction:     Hearing Screening Comments: Able to hear conversational tones w/o difficulty. No issues reported.   Passes whisper test.  Dietary issues and exercise activities discussed: Exercise limited by: None identified Diet (meal preparation, eat out, water intake, caffeinated beverages, dairy products, fruits and vegetables): in general, a "healthy" diet    Goals      Patient Stated   . Begin exercising at [facility] (pt-stated)          Pt states she will begin this month going twice each week on her days off.      Depression Screen PHQ 2/9 Scores 09/16/2016 08/19/2016  PHQ - 2 Score 0 0  PHQ- 9 Score - 1    Fall Risk Fall Risk  09/16/2016 08/19/2016  Falls in the past year? No No    Cognitive Function: Ad8 score reviewed for issues:  Issues making decisions:no  Less interest in hobbies / activities:no  Repeats questions, stories (family complaining):no  Trouble using ordinary gadgets (microwave, computer, phone):no  Forgets the month or year: no  Mismanaging finances: no  Remembering appts:no  Daily problems with thinking and/or memory:no Ad8 score is=0          Screening Tests Health Maintenance  Topic Date Due  . COLONOSCOPY  03/09/1997  . INFLUENZA VACCINE  12/08/2016  . PNA vac Low Risk Adult (2 of 2 - PPSV23) 08/19/2017  . MAMMOGRAM  09/01/2018  . TETANUS/TDAP  11/08/2018  . DEXA SCAN  Completed  . Hepatitis C Screening  Completed      Plan:     Follow up with PCP as directed.   Continue to eat heart healthy diet (full of fruits, vegetables, whole grains, lean protein, water--limit salt, fat, and sugar intake) and increase physical activity as tolerated.  Bring a copy of your advance directives to your next office visit.  I have personally reviewed and noted the following in the patient's chart:    . Medical and social history . Use of alcohol, tobacco or illicit drugs  . Current medications and supplements . Functional ability and status . Nutritional status . Physical activity . Advanced directives . List of other physicians . Hospitalizations, surgeries, and ER visits in previous 12 months . Vitals . Screenings to include cognitive, depression, and falls . Referrals and appointments  In addition, I have reviewed and discussed with patient certain preventive protocols, quality metrics, and best practice recommendations. A written personalized care plan for preventive services as well as general preventive health recommendations were provided to patient.     Shela Nevin, [place]   09/16/2016  Noted. Agree with above.  Wiggins, DO 09/21/16 7:24 PM

## 2016-09-16 ENCOUNTER — Encounter: Payer: Self-pay | Admitting: Family Medicine

## 2016-09-16 ENCOUNTER — Ambulatory Visit (INDEPENDENT_AMBULATORY_CARE_PROVIDER_SITE_OTHER): Payer: Medicare HMO | Admitting: Family Medicine

## 2016-09-16 VITALS — BP 110/82 | HR 74 | Temp 97.9°F | Ht 66.25 in | Wt 184.4 lb

## 2016-09-16 DIAGNOSIS — R7303 Prediabetes: Secondary | ICD-10-CM | POA: Diagnosis not present

## 2016-09-16 DIAGNOSIS — Z0000 Encounter for general adult medical examination without abnormal findings: Secondary | ICD-10-CM | POA: Diagnosis not present

## 2016-09-16 DIAGNOSIS — M810 Age-related osteoporosis without current pathological fracture: Secondary | ICD-10-CM | POA: Diagnosis not present

## 2016-09-16 MED ORDER — ALENDRONATE SODIUM 70 MG PO TABS: 70.0000 mg | ORAL_TABLET | ORAL | 3 refills | Status: DC

## 2016-09-16 NOTE — Patient Instructions (Addendum)
Dietary calcium (1200 mg/d) and vitamin D (800 IU daily).   Healthy Eating Plan Many factors influence your heart health, including eating and exercise habits. Heart (coronary) risk increases with abnormal blood fat (lipid) levels. Heart-healthy meal planning includes limiting unhealthy fats, increasing healthy fats, and making other small dietary changes. This includes maintaining a healthy body weight to help keep lipid levels within a normal range.  WHAT IS MY PLAN?  Your health care provider recommends that you:  Drink a glass of water before meals to help with satiety.  Eat slowly.  An alternative to the water is to add Metamucil. This will help with satiety as well. It does contain calories, unlike water.  WHAT TYPES OF FAT SHOULD I CHOOSE?  Choose healthy fats more often. Choose monounsaturated and polyunsaturated fats, such as olive oil and canola oil, flaxseeds, walnuts, almonds, and seeds.  Eat more omega-3 fats. Good choices include salmon, mackerel, sardines, tuna, flaxseed oil, and ground flaxseeds. Aim to eat fish at least two times each week.  Avoid foods with partially hydrogenated oils in them. These contain trans fats. Examples of foods that contain trans fats are stick margarine, some tub margarines, cookies, crackers, and other baked goods. If you are going to avoid a fat, this is the one to avoid!  WHAT GENERAL GUIDELINES DO I NEED TO FOLLOW?  Check food labels carefully to identify foods with trans fats. Avoid these types of options when possible.  Fill one half of your plate with vegetables and green salads. Eat 4-5 servings of vegetables per day. A serving of vegetables equals 1 cup of raw leafy vegetables,  cup of raw or cooked cut-up vegetables, or  cup of vegetable juice.  Fill one fourth of your plate with whole grains. Look for the word "whole" as the first word in the ingredient list.  Fill one fourth of your plate with lean protein foods.  Eat 4-5  servings of fruit per day. A serving of fruit equals one medium whole fruit,  cup of dried fruit,  cup of fresh, frozen, or canned fruit. Try to avoid fruits in cups/syrups as the sugar content can be high.  Eat more foods that contain soluble fiber. Examples of foods that contain this type of fiber are apples, broccoli, carrots, beans, peas, and barley. Aim to get 20-30 g of fiber per day.  Eat more home-cooked food and less restaurant, buffet, and fast food.  Limit or avoid alcohol.  Limit foods that are high in starch and sugar.  Avoid fried foods when able.  Cook foods by using methods other than frying. Baking, boiling, grilling, and broiling are all great options. Other fat-reducing suggestions include: ? Removing the skin from poultry. ? Removing all visible fats from meats. ? Skimming the fat off of stews, soups, and gravies before serving them. ? Steaming vegetables in water or broth.  Lose weight if you are overweight. Losing just 5-10% of your initial body weight can help your overall health and prevent diseases such as diabetes and heart disease.  Increase your consumption of nuts, legumes, and seeds to 4-5 servings per week. One serving of dried beans or legumes equals  cup after being cooked, one serving of nuts equals 1 ounces, and one serving of seeds equals  ounce or 1 tablespoon.  WHAT ARE GOOD FOODS CAN I EAT? Grains Grainy breads (try to find bread that is 3 g of fiber per slice or greater), oatmeal, light popcorn. Whole-grain cereals. Rice and  pasta, including brown rice and those that are made with whole wheat. Edamame pasta is a great alternative to grain pasta. It has a higher protein content. Try to avoid significant consumption of white bread, sugary cereals, or pastries/baked goods.  Vegetables All vegetables. Cooked white potatoes do not count as vegetables.  Fruits All fruits, but limit pineapple and bananas as these fruits have a higher sugar  content.  Meats and Other Protein Sources Lean, well-trimmed beef, veal, pork, and lamb. Chicken and [place] without skin. All fish and shellfish. Wild duck, rabbit, pheasant, and venison. Egg whites or low-cholesterol egg substitutes. Dried beans, peas, lentils, and tofu.Seeds and most nuts.  Dairy Low-fat or nonfat cheeses, including ricotta, string, and mozzarella. Skim or 1% milk that is liquid, powdered, or evaporated. Buttermilk that is made with low-fat milk. Nonfat or low-fat yogurt. Soy/Almond milk are good alternatives if you cannot handle dairy.  Beverages Water is the best for you. Sports drinks with less sugar are more desirable unless you are a highly active athlete.  Sweets and Desserts Sherbets and fruit ices. Honey, jam, marmalade, jelly, and syrups. Dark chocolate.  Eat all sweets and desserts in moderation.  Fats and Oils Nonhydrogenated (trans-free) margarines. Vegetable oils, including soybean, sesame, sunflower, olive, peanut, safflower, corn, canola, and cottonseed. Salad dressings or mayonnaise that are made with a vegetable oil. Limit added fats and oils that you use for cooking, baking, salads, and as spreads.  Other Cocoa powder. Coffee and tea. Most condiments.  The items listed above may not be a complete list of recommended foods or beverages. Contact your dietitian for more options.   Ms. Volcy , Thank you for taking time to come for your Medicare Wellness Visit. I appreciate your ongoing commitment to your health goals. Please review the following plan we discussed and let me know if I can assist you in the future.   These are the goals we discussed: Goals      Patient Stated   . Begin exercising at [facility] (pt-stated)          Pt states she will begin this month going twice each week on her days off.       This is a list of the screening recommended for you and due dates:  Health Maintenance  Topic Date Due  . Colon Cancer Screening  10/17/2016*   . Flu Shot  12/08/2016  . Pneumonia vaccines (2 of 2 - PPSV23) 08/19/2017  . Mammogram  09/01/2018  . Tetanus Vaccine  11/08/2018  . DEXA scan (bone density measurement)  Completed  .  Hepatitis C: One time screening is recommended by Center for Disease Control  (CDC) for  adults born from [birth year] through 1964.   Completed  *Topic was postponed. The date shown is not the original due date.   Health Maintenance, Female Adopting a healthy lifestyle and getting preventive care can go a long way to promote health and wellness. Talk with your health care provider about what schedule of regular examinations is right for you. This is a good chance for you to check in with your provider about disease prevention and staying healthy. In between checkups, there are plenty of things you can do on your own. Experts have done a lot of research about which lifestyle changes and preventive measures are most likely to keep you healthy. Ask your health care provider for more information. Weight and diet Eat a healthy diet  Be sure to include plenty of vegetables, fruits,  low-fat dairy products, and lean protein.  Do not eat a lot of foods high in solid fats, added sugars, or salt.  Get regular exercise. This is one of the most important things you can do for your health.  Most adults should exercise for at least 150 minutes each week. The exercise should increase your heart rate and make you sweat (moderate-intensity exercise).  Most adults should also do strengthening exercises at least twice a week. This is in addition to the moderate-intensity exercise. Maintain a healthy weight  Body mass index (BMI) is a measurement that can be used to identify possible weight problems. It estimates body fat based on height and weight. Your health care provider can help determine your BMI and help you achieve or maintain a healthy weight.  For females 63 years of age and older:  A BMI below 18.5 is considered  underweight.  A BMI of 18.5 to 24.9 is normal.  A BMI of 25 to 29.9 is considered overweight.  A BMI of 30 and above is considered obese. Watch levels of cholesterol and blood lipids  You should start having your blood tested for lipids and cholesterol at 70 years of age, then have this test every 5 years.  You may need to have your cholesterol levels checked more often if:  Your lipid or cholesterol levels are high.  You are older than 70 years of age.  You are at high risk for heart disease. Cancer screening Lung Cancer  Lung cancer screening is recommended for adults 77-33 years old who are at high risk for lung cancer because of a history of smoking.  A yearly low-dose CT scan of the lungs is recommended for people who:  Currently smoke.  Have quit within the past 15 years.  Have at least a 30-pack-year history of smoking. A pack year is smoking an average of one pack of cigarettes a day for 1 year.  Yearly screening should continue until it has been 15 years since you quit.  Yearly screening should stop if you develop a health problem that would prevent you from having lung cancer treatment. Breast Cancer  Practice breast self-awareness. This means understanding how your breasts normally appear and feel.  It also means doing regular breast self-exams. Let your health care provider know about any changes, no matter how small.  If you are in your 20s or 30s, you should have a clinical breast exam (CBE) by a health care provider every 1-3 years as part of a regular health exam.  If you are 67 or older, have a CBE every year. Also consider having a breast X-ray (mammogram) every year.  If you have a family history of breast cancer, talk to your health care provider about genetic screening.  If you are at high risk for breast cancer, talk to your health care provider about having an MRI and a mammogram every year.  Breast cancer gene (BRCA) assessment is recommended  for women who have family members with BRCA-related cancers. BRCA-related cancers include:  Breast.  Ovarian.  Tubal.  Peritoneal cancers.  Results of the assessment will determine the need for genetic counseling and BRCA1 and BRCA2 testing. Cervical Cancer  Your health care provider may recommend that you be screened regularly for cancer of the pelvic organs (ovaries, uterus, and vagina). This screening involves a pelvic examination, including checking for microscopic changes to the surface of your cervix (Pap test). You may be encouraged to have this screening done every 3  years, beginning at age 7.  For women ages 91-65, health care providers may recommend pelvic exams and Pap testing every 3 years, or they may recommend the Pap and pelvic exam, combined with testing for human papilloma virus (HPV), every 5 years. Some types of HPV increase your risk of cervical cancer. Testing for HPV may also be done on women of any age with unclear Pap test results.  Other health care providers may not recommend any screening for nonpregnant women who are considered low risk for pelvic cancer and who do not have symptoms. Ask your health care provider if a screening pelvic exam is right for you.  If you have had past treatment for cervical cancer or a condition that could lead to cancer, you need Pap tests and screening for cancer for at least 20 years after your treatment. If Pap tests have been discontinued, your risk factors (such as having a new sexual partner) need to be reassessed to determine if screening should resume. Some women have medical problems that increase the chance of getting cervical cancer. In these cases, your health care provider may recommend more frequent screening and Pap tests. Colorectal Cancer  This type of cancer can be detected and often prevented.  Routine colorectal cancer screening usually begins at 70 years of age and continues through 70 years of age.  Your health  care provider may recommend screening at an earlier age if you have risk factors for colon cancer.  Your health care provider may also recommend using home test kits to check for hidden blood in the stool.  A small camera at the end of a tube can be used to examine your colon directly (sigmoidoscopy or colonoscopy). This is done to check for the earliest forms of colorectal cancer.  Routine screening usually begins at age 33.  Direct examination of the colon should be repeated every 5-10 years through 70 years of age. However, you may need to be screened more often if early forms of precancerous polyps or small growths are found. Skin Cancer  Check your skin from head to toe regularly.  Tell your health care provider about any new moles or changes in moles, especially if there is a change in a mole's shape or color.  Also tell your health care provider if you have a mole that is larger than the size of a pencil eraser.  Always use sunscreen. Apply sunscreen liberally and repeatedly throughout the day.  Protect yourself by wearing long sleeves, pants, a wide-brimmed hat, and sunglasses whenever you are outside. Heart disease, diabetes, and high blood pressure  High blood pressure causes heart disease and increases the risk of stroke. High blood pressure is more likely to develop in:  People who have blood pressure in the high end of the normal range (130-139/85-89 mm Hg).  People who are overweight or obese.  People who are African American.  If you are 12-57 years of age, have your blood pressure checked every 3-5 years. If you are 55 years of age or older, have your blood pressure checked every year. You should have your blood pressure measured twice-once when you are at a hospital or clinic, and once when you are not at a hospital or clinic. Record the average of the two measurements. To check your blood pressure when you are not at a hospital or clinic, you can use:  An automated  blood pressure machine at a pharmacy.  A home blood pressure monitor.  If you are  between 68 years and 3 years old, ask your health care provider if you should take aspirin to prevent strokes.  Have regular diabetes screenings. This involves taking a blood sample to check your fasting blood sugar level.  If you are at a normal weight and have a low risk for diabetes, have this test once every three years after 70 years of age.  If you are overweight and have a high risk for diabetes, consider being tested at a younger age or more often. Preventing infection Hepatitis B  If you have a higher risk for hepatitis B, you should be screened for this virus. You are considered at high risk for hepatitis B if:  You were born in a country where hepatitis B is common. Ask your health care provider which countries are considered high risk.  Your parents were born in a high-risk country, and you have not been immunized against hepatitis B (hepatitis B vaccine).  You have HIV or AIDS.  You use needles to inject street drugs.  You live with someone who has hepatitis B.  You have had sex with someone who has hepatitis B.  You get hemodialysis treatment.  You take certain medicines for conditions, including cancer, organ transplantation, and autoimmune conditions. Hepatitis C  Blood testing is recommended for:  Everyone born from [birth year] through 1964.  Anyone with known risk factors for hepatitis C. Sexually transmitted infections (STIs)  You should be screened for sexually transmitted infections (STIs) including gonorrhea and chlamydia if:  You are sexually active and are younger than 70 years of age.  You are older than 70 years of age and your health care provider tells you that you are at risk for this type of infection.  Your sexual activity has changed since you were last screened and you are at an increased risk for chlamydia or gonorrhea. Ask your health care provider if you are at  risk.  If you do not have HIV, but are at risk, it may be recommended that you take a prescription medicine daily to prevent HIV infection. This is called pre-exposure prophylaxis (PrEP). You are considered at risk if:  You are sexually active and do not regularly use condoms or know the HIV status of your partner(s).  You take drugs by injection.  You are sexually active with a partner who has HIV. Talk with your health care provider about whether you are at high risk of being infected with HIV. If you choose to begin PrEP, you should first be tested for HIV. You should then be tested every 3 months for as long as you are taking PrEP. Pregnancy  If you are premenopausal and you may become pregnant, ask your health care provider about preconception counseling.  If you may become pregnant, take 400 to 800 micrograms (mcg) of folic acid every day.  If you want to prevent pregnancy, talk to your health care provider about birth control (contraception). Osteoporosis and menopause  Osteoporosis is a disease in which the bones lose minerals and strength with aging. This can result in serious bone fractures. Your risk for osteoporosis can be identified using a bone density scan.  If you are 70 years of age or older, or if you are at risk for osteoporosis and fractures, ask your health care provider if you should be screened.  Ask your health care provider whether you should take a calcium or vitamin D supplement to lower your risk for osteoporosis.  Menopause may have certain physical symptoms  and risks.  Hormone replacement therapy may reduce some of these symptoms and risks. Talk to your health care provider about whether hormone replacement therapy is right for you. Follow these instructions at home:  Schedule regular health, dental, and eye exams.  Stay current with your immunizations.  Do not use any tobacco products including cigarettes, chewing tobacco, or electronic  cigarettes.  If you are pregnant, do not drink alcohol.  If you are breastfeeding, limit how much and how often you drink alcohol.  Limit alcohol intake to no more than 1 drink per day for nonpregnant women. One drink equals 12 ounces of beer, 5 ounces of wine, or 1 ounces of hard liquor.  Do not use street drugs.  Do not share needles.  Ask your health care provider for help if you need support or information about quitting drugs.  Tell your health care provider if you often feel depressed.  Tell your health care provider if you have ever been abused or do not feel safe at home. This information is not intended to replace advice given to you by your health care provider. Make sure you discuss any questions you have with your health care provider. Document Released: 11/09/2010 Document Revised: 10/02/2015 Document Reviewed: 01/28/2015 Elsevier Interactive Patient Education  2017 [employer].

## 2016-09-16 NOTE — Progress Notes (Signed)
Chief Complaint  Patient presents with  . Follow-up    4 weeks on itching-pt states she is better and she stopped the Zrytec  . Medicare Wellness    with RN    Subjective: Patient is a 71 y.o. female here for itching f/u.  Patient was started on Zyrtec recommended daily moisturization at her last visit. She reports her symptoms have resolved since changing her showerhead and soap brand. She is no longer taking Zyrtec.  The patient was found to have prediabetes under recent labs. She admits that she does not eat a healthy diet and is not exercising routinely. 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  The patient was also found the osteoporotic on recent bone density scan. She does not exercise routinely and does not take any Vit D or Ca supplementation.   ROS: Heart: Denies chest pain  Skin: No itching  Family History  Problem Relation Age of Onset  . Cancer Mother        Uterus  . Alcohol abuse Father   . Heart disease Father   . Heart attack Son    Past Medical History:  Diagnosis Date  . History of chicken pox   . History of shingles   . Osteoporosis    Allergies  Allergen Reactions  . Morphine And Related Swelling    Current Outpatient Prescriptions:  .  diphenhydrAMINE (BENADRYL) 25 MG tablet, Take 2 tablet by mouth at bedtime., Disp: , Rfl:  .  alendronate (FOSAMAX) 70 MG tablet, Take 1 tablet (70 mg total) by mouth once a week. Take with a full glass of water on an empty stomach., Disp: 12 tablet, Rfl: 3  Objective: BP 110/82 (BP Location: Left Arm, Patient Position: Sitting, Cuff Size: Large)   Pulse 74   Temp 97.9 F (36.6 C) (Oral)   Ht 5' 6.25" (1.683 m)   Wt 184 lb 6.4 oz (83.6 kg)   SpO2 94%   BMI 29.54 kg/m  General: Awake, appears stated age Lungs: No accessory muscle use Psych: Age appropriate judgment and insight, normal affect and mood  Assessment and Plan: Osteoporosis without current pathological  fracture, unspecified osteoporosis type - Plan: alendronate (FOSAMAX) 70 MG tablet  Prediabetes - Plan: HgB A1c  Encounter for Medicare annual wellness exam  Orders as above.  Vit D 800 IU's daily and Ca 1200 mg daily. Wt bearing exercise. F/u in 3 mo for lab check, if doing well will schedule appt 6 mo from today, 3 mo from check. If not, will schedule soon after labs. The patient voiced understanding and agreement to the plan.  Greater than 25 minutes were spent face to face with the patient with greater than 50% of this time spent counseling on diet/exercise, osteoporosis monitoring, treatment, prognosis, Vit D/Ca supplementation, and pharmacologic options for prediabetes.   [place], DO 09/16/16  11:59 AM

## 2016-09-20 ENCOUNTER — Encounter: Payer: Self-pay | Admitting: Family Medicine

## 2016-09-28 DIAGNOSIS — Z0000 Encounter for general adult medical examination without abnormal findings: Secondary | ICD-10-CM | POA: Diagnosis not present

## 2016-09-28 DIAGNOSIS — E663 Overweight: Secondary | ICD-10-CM | POA: Diagnosis not present

## 2016-09-28 DIAGNOSIS — Z79899 Other long term (current) drug therapy: Secondary | ICD-10-CM | POA: Diagnosis not present

## 2016-09-28 DIAGNOSIS — Z6829 Body mass index (BMI) 29.0-29.9, adult: Secondary | ICD-10-CM | POA: Diagnosis not present

## 2016-09-28 DIAGNOSIS — G4700 Insomnia, unspecified: Secondary | ICD-10-CM | POA: Diagnosis not present

## 2016-09-28 DIAGNOSIS — K08409 Partial loss of teeth, unspecified cause, unspecified class: Secondary | ICD-10-CM | POA: Diagnosis not present

## 2016-09-28 DIAGNOSIS — Z87891 Personal history of nicotine dependence: Secondary | ICD-10-CM | POA: Diagnosis not present

## 2016-09-28 DIAGNOSIS — Z7983 Long term (current) use of bisphosphonates: Secondary | ICD-10-CM | POA: Diagnosis not present

## 2016-09-28 DIAGNOSIS — M810 Age-related osteoporosis without current pathological fracture: Secondary | ICD-10-CM | POA: Diagnosis not present

## 2016-09-28 DIAGNOSIS — R233 Spontaneous ecchymoses: Secondary | ICD-10-CM | POA: Diagnosis not present

## 2016-11-16 ENCOUNTER — Telehealth: Payer: Self-pay | Admitting: *Deleted

## 2016-11-16 NOTE — Telephone Encounter (Signed)
Called and spoke with the pt and informed her that we received a message from [place] stating:The patient was not available by phone and/or has not returned our calls.  Pt stated that she has had a lot going on but she can call and take care of this now.  She stated that they may have been calling during the day while she was at work and she does not have a answering machine.   Pt asked for the number to [facility] and stated that she will give them a call to schedule an appt.  She also schedule a lab appt to come in for a repeat HgbA1C.//AB/CMA

## 2016-11-17 ENCOUNTER — Other Ambulatory Visit (INDEPENDENT_AMBULATORY_CARE_PROVIDER_SITE_OTHER): Payer: Medicare HMO

## 2016-11-17 DIAGNOSIS — R7303 Prediabetes: Secondary | ICD-10-CM | POA: Diagnosis not present

## 2016-11-17 LAB — HEMOGLOBIN A1C: Hgb A1c MFr Bld: 5.8 % (ref 4.6–6.5)

## 2017-01-07 ENCOUNTER — Telehealth: Payer: Self-pay

## 2017-01-07 NOTE — Telephone Encounter (Signed)
Called patient to schedule 2nd shingrix vaccine.  No answer.  Left a message for call back.    Schedulers--when patient calls back, please schedule nurse visit for 2nd shingrix before October 3rd. Thanks.

## 2017-03-03 ENCOUNTER — Ambulatory Visit (INDEPENDENT_AMBULATORY_CARE_PROVIDER_SITE_OTHER): Payer: Medicare HMO

## 2017-03-03 ENCOUNTER — Ambulatory Visit: Payer: Medicare HMO | Admitting: *Deleted

## 2017-03-03 DIAGNOSIS — Z23 Encounter for immunization: Secondary | ICD-10-CM | POA: Diagnosis not present

## 2017-07-01 ENCOUNTER — Emergency Department (HOSPITAL_BASED_OUTPATIENT_CLINIC_OR_DEPARTMENT_OTHER)
Admission: EM | Admit: 2017-07-01 | Discharge: 2017-07-01 | Disposition: A | Discharge status: Home / Self Care | Payer: Medicare HMO | Attending: Emergency Medicine | Admitting: Emergency Medicine

## 2017-07-01 ENCOUNTER — Encounter (HOSPITAL_BASED_OUTPATIENT_CLINIC_OR_DEPARTMENT_OTHER): Payer: Self-pay | Admitting: *Deleted

## 2017-07-01 ENCOUNTER — Other Ambulatory Visit: Payer: Self-pay

## 2017-07-01 DIAGNOSIS — Z87891 Personal history of nicotine dependence: Secondary | ICD-10-CM | POA: Insufficient documentation

## 2017-07-01 DIAGNOSIS — Z79899 Other long term (current) drug therapy: Secondary | ICD-10-CM | POA: Insufficient documentation

## 2017-07-01 DIAGNOSIS — N3090 Cystitis, unspecified without hematuria: Secondary | ICD-10-CM | POA: Insufficient documentation

## 2017-07-01 DIAGNOSIS — R300 Dysuria: Secondary | ICD-10-CM | POA: Diagnosis not present

## 2017-07-01 LAB — URINALYSIS, ROUTINE W REFLEX MICROSCOPIC
Bilirubin Urine: NEGATIVE
Glucose, UA: NEGATIVE mg/dL
Ketones, ur: NEGATIVE mg/dL
Nitrite: NEGATIVE
Protein, ur: 30 mg/dL — AB
Specific Gravity, Urine: 1.025 (ref 1.005–1.030)
pH: 6 (ref 5.0–8.0)

## 2017-07-01 LAB — URINALYSIS, MICROSCOPIC (REFLEX)

## 2017-07-01 MED ORDER — CEPHALEXIN 500 MG PO CAPS: 500.0000 mg | ORAL_CAPSULE | Freq: Two times a day (BID) | ORAL | 0 refills | Status: DC

## 2017-07-01 MED ORDER — CEPHALEXIN 250 MG PO CAPS
500.0000 mg | ORAL_CAPSULE | Freq: Once | ORAL | Status: AC
  Administered 2017-07-01: 500 mg via ORAL
  Filled 2017-07-01: qty 2

## 2017-07-01 NOTE — ED Triage Notes (Signed)
Dysuria x 2 weeks 

## 2017-07-01 NOTE — ED Provider Notes (Signed)
[facility] DEPART[facility] Provider Note   CSN: [id number] Arrival date & time: 07/01/17  1931     History   Chief Complaint Chief Complaint  Patient presents with  . Dysuria    HPI Kelly Henderson is a 72 y.o. female.  Patient with no significant past medical history, remote history of urinary tract infection, influenza 1-2 weeks ago --presents with approximately 2-week history of dysuria, increased frequency and urgency that reminds her previous UTIs.  Patient does not have any fevers or vomiting since she got over the flu.  No abdominal pain.  No vaginal bleeding or discharge.  She denies any treatments prior to arrival.  She has not had a history of diverticulitis.  Onset of symptoms acute.  Course is constant.      Past Medical History:  Diagnosis Date  . History of chicken pox   . History of shingles   . Osteoporosis     Patient Active Problem List   Diagnosis Date Noted  . CIGARETTE SMOKER 12/25/2008  . DEPRESSION 12/25/2008  . CONTACT DERMATITIS 12/25/2008  . EFFUSION OF UPPER ARM JOINT 12/25/2008    Past Surgical History:  Procedure Laterality Date  . ABDOMINAL HYSTERECTOMY    . AUGMENTATION MAMMAPLASTY    . BREAST BIOPSY     Patient has had 3 biopsy  . BREAST ENHANCEMENT SURGERY  1975  . TONSILLECTOMY      OB History    No data available       Home Medications    Prior to Admission medications   Medication Sig Start Date End Date Taking? Authorizing Provider  alendronate (FOSAMAX) 70 MG tablet Take 1 tablet (70 mg total) by mouth once a week. Take with a full glass of water on an empty stomach. 09/16/16   Shelda Pal, DO  cephALEXin (KEFLEX) 500 MG capsule Take 1 capsule (500 mg total) by mouth 2 (two) times daily. 07/01/17   Carlisle Cater, PA-C  diphenhydrAMINE (BENADRYL) 25 MG tablet Take 2 tablet by mouth at bedtime.    [provider]    Family History Family History  Problem Relation Age of Onset    . Cancer Mother        Uterus  . Alcohol abuse Father   . Heart disease Father   . Heart attack Son     Social History Social History   Tobacco Use  . Smoking status: Former Smoker    Packs/day: 2.00    Years: 50.00    Pack years: 100.00    Types: Cigarettes    Last attempt to quit: 07/01/2016    Years since quitting: 1.0  . Smokeless tobacco: Never Used  Substance Use Topics  . Alcohol use: No  . Drug use: No     Allergies   Morphine and related   Review of Systems Review of Systems  Constitutional: Negative for chills and fever.  HENT: Negative for rhinorrhea and sore throat.   Eyes: Negative for redness.  Respiratory: Negative for cough.   Cardiovascular: Negative for chest pain.  Gastrointestinal: Negative for abdominal pain, diarrhea, nausea and vomiting.  Genitourinary: Positive for dysuria, frequency and urgency. Negative for hematuria, vaginal bleeding and vaginal discharge.  Musculoskeletal: Negative for myalgias.  Skin: Negative for rash.  Neurological: Negative for headaches.     Physical Exam Updated Vital Signs BP 140/90   Pulse 82   Temp 98.2 F (36.8 C) (Oral)   Resp 20   Ht 5\' 7"  (1.702  m)   Wt 86.2 kg (190 lb)   SpO2 93%   BMI 29.76 kg/m   Physical Exam  Constitutional: She appears well-developed and well-nourished.  HENT:  Head: Normocephalic and atraumatic.  Eyes: Conjunctivae are normal.  Neck: Normal range of motion. Neck supple.  Pulmonary/Chest: No respiratory distress.  Abdominal: Soft. She exhibits no distension and no mass. There is no tenderness. There is no rebound and no guarding.  Neurological: She is alert.  Skin: Skin is warm and dry.  Psychiatric: She has a normal mood and affect.  Nursing note and vitals reviewed.    ED Treatments / Results  Labs (all labs ordered are listed, but only abnormal results are displayed) Labs Reviewed  URINALYSIS, ROUTINE W REFLEX MICROSCOPIC - Abnormal; Notable for the  following components:      Result Value   APPearance CLOUDY (*)    Hgb urine dipstick SMALL (*)    Protein, ur 30 (*)    Leukocytes, UA SMALL (*)    All other components within normal limits  URINALYSIS, MICROSCOPIC (REFLEX) - Abnormal; Notable for the following components:   Bacteria, UA FEW (*)    Squamous Epithelial / LPF 0-5 (*)    All other components within normal limits    EKG  EKG Interpretation None       Radiology No results found.  Procedures Procedures (including critical care time)  Medications Ordered in ED Medications  cephALEXin (KEFLEX) capsule 500 mg (500 mg Oral Given 07/01/17 2128)     Initial Impression / Assessment and Plan / ED Course  I have reviewed the triage vital signs and the nursing notes.  Pertinent labs & imaging results that were available during my care of the patient were reviewed by me and considered in my medical decision making (see chart for details).     Patient seen and examined.   Vital signs reviewed and are as follows: BP 140/90   Pulse 82   Temp 98.2 F (36.8 C) (Oral)   Resp 20   Ht 5\' 7"  (1.702 m)   Wt 86.2 kg (190 lb)   SpO2 93%   BMI 29.76 kg/m   Clinical signs and symptoms consistent with cystitis.  Will start on Keflex.  Patient encouraged to follow-up with PCP for recheck.  Encouraged return with fevers, vomiting, flank pain, no abdominal pain or other concerns.  Patient verbalizes understanding and agrees with plan.  Final Clinical Impressions(s) / ED Diagnoses   Final diagnoses:  Cystitis   Patient with clinical signs and symptoms consistent with cystitis.  UA reviewed by myself showing white blood cells 6-30, clean catch.  Will treat.  Patient is nontender in the abdomen.  Do not suspect other intra-abdominal etiology at this time.  ED Discharge Orders        Ordered    cephALEXin (KEFLEX) 500 MG capsule  2 times daily     07/01/17 2122       Carlisle Cater, PA-C 07/01/17 2142    Margette Fast, MD 07/02/17 1121

## 2017-07-01 NOTE — Discharge Instructions (Signed)
Please read and follow all provided instructions.  Your diagnoses today include:  1. Cystitis     Tests performed today include:  Urine test - suggests that you have an infection in your bladder  Vital signs. See below for your results today.   Medications prescribed:   Keflex (cephalexin) - antibiotic  You have been prescribed an antibiotic medicine: take the entire course of medicine even if you are feeling better. Stopping early can cause the antibiotic not to work.  Home care instructions:  Follow any educational materials contained in this packet.  Follow-up instructions: Please follow-up with your primary care provider in 3 days if symptoms are not resolved for further evaluation of your symptoms.  Return instructions:   Please return to the Emergency Department if you experience worsening symptoms.   Return with fever, worsening pain, persistent vomiting, worsening pain in your back.   Please return if you have any other emergent concerns.  Additional Information:  Your vital signs today were: BP 140/90    Pulse 82    Temp 98.2 F (36.8 C) (Oral)    Resp 20    Ht 5\' 7"  (1.702 m)    Wt 86.2 kg (190 lb)    SpO2 93%    BMI 29.76 kg/m  If your blood pressure (BP) was elevated above 135/85 this visit, please have this repeated by your doctor within one month. --------------

## 2017-09-15 NOTE — Progress Notes (Signed)
Subjective:   Kelly Henderson is a 72 y.o. female who presents for Medicare Annual (Subsequent) preventive examination. Works at least 40 hrs per week.   Review of Systems: No ROS.  Medicare Wellness Visit. Additional risk factors are reflected in the social history.  Cardiac Risk Factors include: advanced age (>98men, >35 women) Sleep patterns: Takes benadryl. Sleeps 8 hrs. Wakes a few times to urinate. Home Safety/Smoke Alarms: Feels safe in home. Smoke alarms in place.  Living environment; residence and Firearm Safety: 2 story home. granddaughter lives with her now. Still seeing husband although they are separated.    Female:     Mammo- ordered    Dexa scan- utd       CCS- pt is going to check with insurance about cologuard and call us if she wants [place] to order    Objective:     Vitals: BP 110/72 (BP Location: Left Arm, Patient Position: Sitting, Cuff Size: Normal) Comment: All vitals done by R.Ewing CMA  Pulse 78   Temp 97.9 F (36.6 C)   Ht 5\' 7"  (1.702 m)   Wt 203 lb (92.1 kg)   SpO2 93%   BMI 31.79 kg/m   Body mass index is 31.79 kg/m.  Advanced Directives 09/19/2017 09/16/2016  Does Patient Have a Medical Advance Directive? Yes Yes  Type of Paramedic of [place];Living will [place];Living will  Does patient want to make changes to medical advance directive? No - Patient declined -  Copy of [place] in Chart? No - copy requested No - copy requested    Tobacco Social History   Tobacco Use  Smoking Status Former Smoker  . Packs/day: 2.00  . Years: 50.00  . Pack years: 100.00  . Types: Cigarettes  . Last attempt to quit: 07/01/2016  . Years since quitting: 1.2  Smokeless Tobacco Never Used     Counseling given: Not Answered   Clinical Intake: Pain : No/denies pain     Past Medical History:  Diagnosis Date  . History of chicken pox   . History of shingles   . Osteoporosis    Past  Surgical History:  Procedure Laterality Date  . ABDOMINAL HYSTERECTOMY    . AUGMENTATION MAMMAPLASTY    . BREAST BIOPSY     Patient has had 3 biopsy  . BREAST ENHANCEMENT SURGERY  1975  . TONSILLECTOMY     Family History  Problem Relation Age of Onset  . Cancer Mother        Uterus  . Alcohol abuse Father   . Heart disease Father   . Heart attack Son    Social History   Socioeconomic History  . Marital status: Married    Spouse name: Not on file  . Number of children: Not on file  . Years of education: Not on file  . Highest education level: Not on file  Occupational History  . Not on file  Social Needs  . Financial resource strain: Not on file  . Food insecurity:    Worry: Not on file    Inability: Not on file  . Transportation needs:    Medical: Not on file    Non-medical: Not on file  Tobacco Use  . Smoking status: Former Smoker    Packs/day: 2.00    Years: 50.00    Pack years: 100.00    Types: Cigarettes    Last attempt to quit: 07/01/2016    Years since quitting:  1.2  . Smokeless tobacco: Never Used  Substance and Sexual Activity  . Alcohol use: No  . Drug use: No  . Sexual activity: Yes    Birth control/protection: Surgical  Lifestyle  . Physical activity:    Days per week: Not on file    Minutes per session: Not on file  . Stress: Not on file  Relationships  . Social connections:    Talks on phone: Not on file    Gets together: Not on file    Attends religious service: Not on file    Active member of club or organization: Not on file    Attends meetings of clubs or organizations: Not on file    Relationship status: Not on file  Other Topics Concern  . Not on file  Social History Narrative  . Not on file    Outpatient Encounter Medications as of 09/19/2017  Medication Sig  . alendronate (FOSAMAX) 70 MG tablet Take 1 tablet (70 mg total) by mouth once a week. Take with a full glass of water on an empty stomach.  . diphenhydrAMINE (BENADRYL) 25  MG tablet Take 2 tablet by mouth at bedtime.  . [DISCONTINUED] cephALEXin (KEFLEX) 500 MG capsule Take 1 capsule (500 mg total) by mouth 2 (two) times daily.   No facility-administered encounter medications on file as of 09/19/2017.     Activities of Daily Living In your present state of health, do you have any difficulty performing the following activities: 09/19/2017  Hearing? N  Vision? N  Comment wears glasses.  Difficulty concentrating or making decisions? N  Walking or climbing stairs? N  Dressing or bathing? N  Doing errands, shopping? N  Preparing Food and eating ? N  Using the Toilet? N  In the past six months, have you accidently leaked urine? N  Do you have problems with loss of bowel control? N  Managing your Medications? N  Managing your Finances? N  Housekeeping or managing your Housekeeping? N  Some recent data might be hidden    Patient Care Team: Shelda Pal, DO as PCP - General (Family Medicine)    Assessment:   This is a routine wellness examination for [employer]. Physical assessment deferred to PCP.  Exercise Activities and Dietary recommendations Current Exercise Habits: Structured exercise class, Type of exercise: stretching;calisthenics, Time (Minutes): 60, Frequency (Times/Week): 2, Weekly Exercise (Minutes/Week): 120, Intensity: Mild, Exercise limited by: None identified Diet (meal preparation, eat out, water intake, caffeinated beverages, dairy products, fruits and vegetables): well balanced   Goals    . Maintain current health and not start smoking again.       Fall Risk Fall Risk  09/19/2017 09/16/2016 08/19/2016  Falls in the past year? No No No    Depression Screen PHQ 2/9 Scores 09/19/2017 09/16/2016 08/19/2016  PHQ - 2 Score 0 0 0  PHQ- 9 Score - - 1     Cognitive Function Ad8 score reviewed for issues:  Issues making decisions:no  Less interest in hobbies / activities:no  Repeats questions, stories (family  complaining):  Trouble using ordinary gadgets (microwave, computer, phone):no  Forgets the month or year: no  Mismanaging finances: no  Remembering appts:no  Daily problems with thinking and/or memory:no Ad8 score is=0        Immunization History  Administered Date(s) Administered  . Pneumococcal Conjugate-13 08/19/2016  . Td 11/07/2008  . Zoster Recombinat (Shingrix) 08/19/2016, 03/03/2017   Screening Tests Health Maintenance  Topic Date Due  . COLONOSCOPY  03/09/1997  . PNA vac Low Risk Adult (2 of 2 - PPSV23) 08/19/2017  . INFLUENZA VACCINE  12/08/2017  . MAMMOGRAM  09/01/2018  . TETANUS/TDAP  11/08/2018  . DEXA SCAN  Completed  . Hepatitis C Screening  Completed      Plan:   Follow [place] with PCP as directed.  Continue to eat heart healthy diet (full of fruits, vegetables, whole grains, lean protein, water--limit salt, fat, and sugar intake) and increase physical activity as tolerated.  Continue doing brain stimulating activities (puzzles, reading, adult coloring books, staying active) to keep memory sharp.   Bring a copy of your living will and/or healthcare power of attorney to your next office visit.  Call us regarding colon cancer screening.  I have personally reviewed and noted the following in the patient's chart:   . Medical and social history . Use of alcohol, tobacco or illicit drugs  . Current medications and supplements . Functional ability and status . Nutritional status . Physical activity . Advanced directives . List of other physicians . Hospitalizations, surgeries, and ER visits in previous 12 months . Vitals . Screenings to include cognitive, depression, and falls . Referrals and appointments  In addition, I have reviewed and discussed with patient certain preventive protocols, quality metrics, and best practice recommendations. A written personalized care plan for preventive services as well as general preventive health recommendations  were provided to patient.     Shela Nevin, [place]  09/19/2017

## 2017-09-19 ENCOUNTER — Ambulatory Visit (INDEPENDENT_AMBULATORY_CARE_PROVIDER_SITE_OTHER): Payer: Medicare HMO | Admitting: Family Medicine

## 2017-09-19 ENCOUNTER — Encounter: Payer: Self-pay | Admitting: Family Medicine

## 2017-09-19 ENCOUNTER — Encounter: Payer: Self-pay | Admitting: *Deleted

## 2017-09-19 ENCOUNTER — Ambulatory Visit (INDEPENDENT_AMBULATORY_CARE_PROVIDER_SITE_OTHER): Payer: Medicare HMO | Admitting: *Deleted

## 2017-09-19 VITALS — BP 110/72 | HR 78 | Temp 97.9°F | Ht 67.0 in | Wt 203.0 lb

## 2017-09-19 VITALS — BP 110/72 | HR 78 | Temp 97.9°F | Ht 67.0 in | Wt 203.5 lb

## 2017-09-19 DIAGNOSIS — M810 Age-related osteoporosis without current pathological fracture: Secondary | ICD-10-CM | POA: Insufficient documentation

## 2017-09-19 DIAGNOSIS — Z5320 Procedure and treatment not carried out because of patient's decision for unspecified reasons: Secondary | ICD-10-CM

## 2017-09-19 DIAGNOSIS — L989 Disorder of the skin and subcutaneous tissue, unspecified: Secondary | ICD-10-CM

## 2017-09-19 DIAGNOSIS — Z0000 Encounter for general adult medical examination without abnormal findings: Secondary | ICD-10-CM | POA: Diagnosis not present

## 2017-09-19 DIAGNOSIS — Z1239 Encounter for other screening for malignant neoplasm of breast: Secondary | ICD-10-CM

## 2017-09-19 NOTE — Patient Instructions (Signed)
Follow [place] with PCP as directed.  Continue to eat heart healthy diet (full of fruits, vegetables, whole grains, lean protein, water--limit salt, fat, and sugar intake) and increase physical activity as tolerated.  Continue doing brain stimulating activities (puzzles, reading, adult coloring books, staying active) to keep memory sharp.   Bring a copy of your living will and/or healthcare power of attorney to your next office visit.  Call us regarding colon cancer screening.   Kelly Henderson , Thank you for taking time to come for your Medicare Wellness Visit. I appreciate your ongoing commitment to your health goals. Please review the following plan we discussed and let me know if I can assist you in the future.   These are the goals we discussed: Goals    . Maintain current health and not start smoking again.       This is a list of the screening recommended for you and due dates:  Health Maintenance  Topic Date Due  . Colon Cancer Screening  03/09/1997  . Pneumonia vaccines (2 of 2 - PPSV23) 08/19/2017  . Flu Shot  12/08/2017  . Mammogram  09/01/2018  . Tetanus Vaccine  11/08/2018  . DEXA scan (bone density measurement)  Completed  .  Hepatitis C: One time screening is recommended by Center for Disease Control  (CDC) for  adults born from [birth year] through 1964.   Completed

## 2017-09-19 NOTE — Progress Notes (Signed)
Noted. Agree with above.  [place], DO 09/19/17 11:03 AM

## 2017-09-19 NOTE — Progress Notes (Signed)
Chief Complaint  Patient presents with  . Follow-up    Subjective: Patient is a 72 y.o. female here for f/u osteoporosis.  Currently on Fosamax weekly and reports compliance. No AE's. DEXA was 08/2016. Takes Vit D and Ca supp.   Skin lesion for 10 years that comes and goes. Itches, no new topicals, pain or drainage. Feels that it is getting better.  ROS: Skin: As noted in HPI   Past Medical History:  Diagnosis Date  . History of chicken pox   . History of shingles   . Osteoporosis    Objective: BP 110/72 (BP Location: Left Arm, Patient Position: Sitting, Cuff Size: Normal)   Pulse 78   Temp 97.9 F (36.6 C) (Oral)   Ht 5\' 7"  (1.702 m)   Wt 203 lb 8 oz (92.3 kg)   SpO2 93%   BMI 31.87 kg/m  General: Awake, appears stated age Heart: RRR, no LE edema Lungs: CTAB, no rales, wheezes or rhonchi. No accessory muscle use Skin: See below, no excessive warmth or ttp Psych: Age appropriate judgment and insight, normal affect and mood     L lower ext  Assessment and Plan: Osteoporosis without current pathological fracture, unspecified osteoporosis type - Plan: Comprehensive metabolic panel, Vitamin D (25 hydroxy)  Skin lesion  Breast cancer screening - Plan: MM DIAG BREAST TOMO BILATERAL  Lung cancer screening declined by patient - Plan: CT CHEST LUNG CA SCREEN LOW DOSE W/O CM  Orders as above. Cont Fosamax, ck Ca and Vit D. Cont wt bearing exercise. Photosensitive dermatitis? Does not appear sinister as it self resolves. Offered biopsy vs referral vs watchful waiting. She would like to keep an eye on it. F/u on health maintenance issues as above. F/u in 6 mo for CPE. The patient voiced understanding and agreement to the plan.  [place], DO 09/19/17  10:39 AM

## 2017-09-19 NOTE — Progress Notes (Signed)
Pre visit review using our clinic review tool, if applicable. No additional management support is needed unless otherwise documented below in the visit note. 

## 2017-09-19 NOTE — Patient Instructions (Addendum)
Continue on Fosamax.   Continue Vit D and calcium supplementation.  Stay physically active.  If you change your mind about the skin lesion, let us know.  Let us know if you need anything.

## 2017-09-24 ENCOUNTER — Ambulatory Visit (HOSPITAL_BASED_OUTPATIENT_CLINIC_OR_DEPARTMENT_OTHER)
Admission: RE | Admit: 2017-09-24 | Discharge: 2017-09-24 | Disposition: A | Discharge status: Home / Self Care | Payer: Medicare HMO | Source: Ambulatory Visit | Attending: Family Medicine | Admitting: Family Medicine

## 2017-09-24 ENCOUNTER — Encounter (HOSPITAL_BASED_OUTPATIENT_CLINIC_OR_DEPARTMENT_OTHER): Payer: Self-pay

## 2017-09-24 DIAGNOSIS — Z5320 Procedure and treatment not carried out because of patient's decision for unspecified reasons: Secondary | ICD-10-CM | POA: Insufficient documentation

## 2017-09-24 DIAGNOSIS — Z1231 Encounter for screening mammogram for malignant neoplasm of breast: Secondary | ICD-10-CM | POA: Insufficient documentation

## 2017-09-24 DIAGNOSIS — Z1239 Encounter for other screening for malignant neoplasm of breast: Secondary | ICD-10-CM

## 2017-09-24 DIAGNOSIS — Z87891 Personal history of nicotine dependence: Secondary | ICD-10-CM | POA: Diagnosis not present

## 2017-09-26 ENCOUNTER — Encounter: Payer: Self-pay | Admitting: Family Medicine

## 2017-09-26 DIAGNOSIS — I700 Atherosclerosis of aorta: Secondary | ICD-10-CM | POA: Insufficient documentation

## 2018-01-12 DIAGNOSIS — L309 Dermatitis, unspecified: Secondary | ICD-10-CM | POA: Diagnosis not present

## 2018-09-21 ENCOUNTER — Ambulatory Visit: Payer: Medicare HMO | Admitting: *Deleted

## 2018-11-22 NOTE — Progress Notes (Signed)
Virtual Visit via Video Note  I connected with patient on 11/23/18 at 11:00 AM EDT by audio enabled telemedicine application and verified that I am speaking with the correct person using two identifiers.   THIS ENCOUNTER IS A VIRTUAL VISIT DUE TO COVID-19 - PATIENT WAS NOT SEEN IN THE OFFICE. PATIENT HAS CONSENTED TO VIRTUAL VISIT / TELEMEDICINE VISIT   Location of patient: home  Location of provider: office  I discussed the limitations of evaluation and management by telemedicine and the availability of in person appointments. The patient expressed understanding and agreed to proceed.   Subjective:   Kelly Henderson is a 73 y.o. female who presents for Medicare Annual (Subsequent) preventive examination.  Still working at least 50 hrs per week.   Review of Systems: No ROS.  Medicare Wellness Virtual Visit.  Visual/audio telehealth visit, UTA vital signs.   See social history for additional risk factors. Cardiac Risk Factors include: advanced age (>66men, >54 women) Sleep patterns: no issues Home Safety/Smoke Alarms: Feels safe in home. Smoke alarms in place.  Lives in 2 story home. Granddaughter currently living with her. Still seeing husband although they are separated. Has a 60 month old german sheppard "Trixie".   Female:     Mammo- ordered     Dexa scan- ordered        CCS- ordered    Objective:    Advanced Directives 11/23/2018 09/19/2017 09/16/2016  Does Patient Have a Medical Advance Directive? Yes Yes Yes  Type of Paramedic of [place];Living will [place];Living will [place];Living will  Does patient want to make changes to medical advance directive? No - Patient declined No - Patient declined -  Copy of [place] in Chart? No - copy requested No - copy requested No - copy requested    Tobacco Social History   Tobacco Use  Smoking Status Former Smoker  . Packs/day: 2.00  .  Years: 50.00  . Pack years: 100.00  . Types: Cigarettes  . Quit date: 07/01/2016  . Years since quitting: 2.3  Smokeless Tobacco Never Used     Counseling given: Not Answered   Clinical Intake: Pain : No/denies pain     Past Medical History:  Diagnosis Date  . Aortic atherosclerosis ([place])    CT 2019  . History of chicken pox   . History of shingles   . Osteoporosis    Past Surgical History:  Procedure Laterality Date  . ABDOMINAL HYSTERECTOMY    . AUGMENTATION MAMMAPLASTY Bilateral    Silicone, in front of muscle  . BREAST BIOPSY     Patient has had 3 biopsy  . BREAST ENHANCEMENT SURGERY  1975  . TONSILLECTOMY     Family History  Problem Relation Age of Onset  . Cancer Mother        Uterus  . Alcohol abuse Father   . Heart disease Father   . Heart attack Son    Social History   Socioeconomic History  . Marital status: Married    Spouse name: Not on file  . Number of children: Not on file  . Years of education: Not on file  . Highest education level: Not on file  Occupational History  . Not on file  Social Needs  . Financial resource strain: Not on file  . Food insecurity    Worry: Not on file    Inability: Not on file  . Transportation needs  Medical: Not on file    Non-medical: Not on file  Tobacco Use  . Smoking status: Former Smoker    Packs/day: 2.00    Years: 50.00    Pack years: 100.00    Types: Cigarettes    Quit date: 07/01/2016    Years since quitting: 2.3  . Smokeless tobacco: Never Used  Substance and Sexual Activity  . Alcohol use: No  . Drug use: No  . Sexual activity: Yes    Birth control/protection: Surgical  Lifestyle  . Physical activity    Days per week: Not on file    Minutes per session: Not on file  . Stress: Not on file  Relationships  . Social Herbalist on phone: Not on file    Gets together: Not on file    Attends religious service: Not on file    Active member of club or organization: Not on file     Attends meetings of clubs or organizations: Not on file    Relationship status: Not on file  Other Topics Concern  . Not on file  Social History Narrative  . Not on file    Outpatient Encounter Medications as of 11/23/2018  Medication Sig  . alendronate (FOSAMAX) 70 MG tablet Take 1 tablet (70 mg total) by mouth once a week. Take with a full glass of water on an empty stomach. (Patient not taking: Reported on 11/23/2018)  . diphenhydrAMINE (BENADRYL) 25 MG tablet Take 2 tablet by mouth at bedtime.   No facility-administered encounter medications on file as of 11/23/2018.     Activities of Daily Living In your present state of health, do you have any difficulty performing the following activities: 11/23/2018  Hearing? N  Vision? N  Difficulty concentrating or making decisions? N  Walking or climbing stairs? N  Dressing or bathing? N  Doing errands, shopping? N  Preparing Food and eating ? N  Using the Toilet? N  In the past six months, have you accidently leaked urine? N  Do you have problems with loss of bowel control? N  Managing your Medications? N  Managing your Finances? N  Housekeeping or managing your Housekeeping? N  Some recent data might be hidden    Patient Care Team: Shelda Pal, DO as PCP - General (Family Medicine)    Assessment:   This is a routine wellness examination for [facility]. Physical assessment deferred to PCP.  Exercise Activities and Dietary recommendations Current Exercise Habits: The patient has a physically strenuous job, but has no regular exercise apart from work., Exercise limited by: None identified Diet (meal preparation, eat out, water intake, caffeinated beverages, dairy products, fruits and vegetables): in general, a "healthy" diet  , well balanced   Goals    . Begin exercising at [facility] (pt-stated)     Pt states she will begin this month going twice each week on her days off.    . Maintain current health and not start smoking  again.       Fall Risk Fall Risk  11/23/2018 09/19/2017 09/16/2016 08/19/2016  Falls in the past year? 0 No No No     Depression Screen PHQ 2/9 Scores 11/23/2018 09/19/2017 09/16/2016 08/19/2016  PHQ - 2 Score 0 0 0 0  PHQ- 9 Score - - - 1     Cognitive Function Ad8 score reviewed for issues:  Issues making decisions:no  Less interest in hobbies / activities:no  Repeats questions, stories (family complaining):no  Trouble using  ordinary gadgets (microwave, computer, phone):no  Forgets the month or year: no  Mismanaging finances: no  Remembering appts:no  Daily problems with thinking and/or memory:no Ad8 score is=0         Immunization History  Administered Date(s) Administered  . Pneumococcal Conjugate-13 08/19/2016  . Td 11/07/2008  . Zoster Recombinat (Shingrix) 08/19/2016, 03/03/2017   Screening Tests Health Maintenance  Topic Date Due  . COLONOSCOPY  03/09/1997  . PNA vac Low Risk Adult (2 of 2 - PPSV23) 08/19/2017  . TETANUS/TDAP  11/08/2018  . INFLUENZA VACCINE  12/09/2018  . MAMMOGRAM  09/25/2019  . DEXA SCAN  Completed  . Hepatitis C Screening  Completed      Plan:   See you next year!   Keep up the great work!  I have ordered your mammogram, bone density scan, and colonoscopy. Please schedule.   I have personally reviewed and noted the following in the patient's chart:   . Medical and social history . Use of alcohol, tobacco or illicit drugs  . Current medications and supplements . Functional ability and status . Nutritional status . Physical activity . Advanced directives . List of other physicians . Hospitalizations, surgeries, and ER visits in previous 12 months . Vitals . Screenings to include cognitive, depression, and falls . Referrals and appointments  In addition, I have reviewed and discussed with patient certain preventive protocols, quality metrics, and best practice recommendations. A written personalized care plan for  preventive services as well as general preventive health recommendations were provided to patient.     Shela Nevin, [place]  11/23/2018

## 2018-11-23 ENCOUNTER — Ambulatory Visit (INDEPENDENT_AMBULATORY_CARE_PROVIDER_SITE_OTHER): Payer: Medicare HMO | Admitting: *Deleted

## 2018-11-23 ENCOUNTER — Encounter: Payer: Self-pay | Admitting: *Deleted

## 2018-11-23 ENCOUNTER — Encounter: Payer: Self-pay | Admitting: Gastroenterology

## 2018-11-23 ENCOUNTER — Other Ambulatory Visit: Payer: Self-pay

## 2018-11-23 DIAGNOSIS — Z1231 Encounter for screening mammogram for malignant neoplasm of breast: Secondary | ICD-10-CM | POA: Diagnosis not present

## 2018-11-23 DIAGNOSIS — Z0000 Encounter for general adult medical examination without abnormal findings: Secondary | ICD-10-CM

## 2018-11-23 DIAGNOSIS — Z780 Asymptomatic menopausal state: Secondary | ICD-10-CM | POA: Diagnosis not present

## 2018-11-23 DIAGNOSIS — Z1211 Encounter for screening for malignant neoplasm of colon: Secondary | ICD-10-CM

## 2018-11-23 NOTE — Patient Instructions (Signed)
See you next year!   Keep up the great work!  I have ordered your mammogram, bone density scan, and colonoscopy. Please schedule.   Kelly Henderson , Thank you for taking time to come for your Medicare Wellness Visit. I appreciate your ongoing commitment to your health goals. Please review the following plan we discussed and let me know if I can assist you in the future.   These are the goals we discussed: Goals    . Begin exercising at [facility] (pt-stated)     Pt states she will begin this month going twice each week on her days off.    . Maintain current health and not start smoking again.       This is a list of the screening recommended for you and due dates:  Health Maintenance  Topic Date Due  . Colon Cancer Screening  03/09/1997  . Pneumonia vaccines (2 of 2 - PPSV23) 08/19/2017  . Tetanus Vaccine  11/08/2018  . Flu Shot  12/09/2018  . Mammogram  09/25/2019  . DEXA scan (bone density measurement)  Completed  .  Hepatitis C: One time screening is recommended by Center for Disease Control  (CDC) for  adults born from [birth year] through 1964.   Completed

## 2018-11-28 ENCOUNTER — Other Ambulatory Visit: Payer: Self-pay | Admitting: Family Medicine

## 2018-11-28 DIAGNOSIS — C760 Malignant neoplasm of head, face and neck: Secondary | ICD-10-CM

## 2018-11-28 DIAGNOSIS — Z122 Encounter for screening for malignant neoplasm of respiratory organs: Secondary | ICD-10-CM

## 2018-11-28 DIAGNOSIS — F17200 Nicotine dependence, unspecified, uncomplicated: Secondary | ICD-10-CM

## 2018-11-30 ENCOUNTER — Other Ambulatory Visit: Payer: Self-pay

## 2018-11-30 ENCOUNTER — Ambulatory Visit: Payer: Medicare HMO | Admitting: *Deleted

## 2018-11-30 VITALS — Ht 67.0 in | Wt 175.0 lb

## 2018-11-30 DIAGNOSIS — Z1211 Encounter for screening for malignant neoplasm of colon: Secondary | ICD-10-CM

## 2018-11-30 MED ORDER — NA SULFATE-K SULFATE-MG SULF 17.5-3.13-1.6 GM/177ML PO SOLN: ORAL | 0 refills | Status: DC

## 2018-11-30 NOTE — Progress Notes (Signed)
Patient's pre-visit was done today over the phone with the patient due to COVID-19 pandemic. Name,DOB and address verified. Insurance verified. Packet of Prep instructions mailed to patient including copy of a consent form and pre-procedure patient acknowledgement form-pt is aware. Patient understands to call us back with any questions or concerns.  Patient denies any allergies to eggs or soy. Patient denies any problems with anesthesia/sedation. Patient denies any oxygen use at home. Patient denies taking any diet/weight loss medications or blood thinners. EMMI education assisgned to patient on colonoscopy, this was explained and instructions given to patient. Pt is aware that care partner will wait in the car during procedure; if they feel like they will be too hot to wait in the car; they may wait in the lobby.  We want them to wear a mask (we do not have any that we can provide them), practice social distancing, and we will check their temperatures when they get here.  I did remind patient that their care partner needs to stay in the parking lot the entire time. Pt will wear mask into building.

## 2018-12-11 ENCOUNTER — Ambulatory Visit (HOSPITAL_BASED_OUTPATIENT_CLINIC_OR_DEPARTMENT_OTHER): Payer: Medicare HMO

## 2018-12-13 ENCOUNTER — Telehealth: Payer: Self-pay | Admitting: Gastroenterology

## 2018-12-13 NOTE — Telephone Encounter (Signed)

## 2018-12-14 ENCOUNTER — Ambulatory Visit (AMBULATORY_SURGERY_CENTER): Payer: Medicare HMO | Admitting: Gastroenterology

## 2018-12-14 ENCOUNTER — Other Ambulatory Visit: Payer: Self-pay

## 2018-12-14 ENCOUNTER — Encounter: Payer: Self-pay | Admitting: Gastroenterology

## 2018-12-14 VITALS — BP 120/77 | HR 71 | Temp 98.2°F | Resp 13 | Ht 67.0 in | Wt 175.0 lb

## 2018-12-14 DIAGNOSIS — Z1211 Encounter for screening for malignant neoplasm of colon: Secondary | ICD-10-CM

## 2018-12-14 MED ORDER — SODIUM CHLORIDE 0.9 % IV SOLN: 500.0000 mL | Freq: Once | INTRAVENOUS | Status: DC

## 2018-12-14 NOTE — Progress Notes (Signed)
PT taken to PACU. Monitors in place. VSS. Report given to RN. 

## 2018-12-14 NOTE — Patient Instructions (Signed)
YOU HAD AN ENDOSCOPIC PROCEDURE TODAY AT THE [facility]:   Refer to the procedure report that was given to you for any specific questions about what was found during the examination.  If the procedure report does not answer your questions, please call your gastroenterologist to clarify.  If you requested that your care partner not be given the details of your procedure findings, then the procedure report has been included in a sealed envelope for you to review at your convenience later.  YOU SHOULD EXPECT: Some feelings of bloating in the abdomen. Passage of more gas than usual.  Walking can help get rid of the air that was put into your GI tract during the procedure and reduce the bloating. If you had a lower endoscopy (such as a colonoscopy or flexible sigmoidoscopy) you may notice spotting of blood in your stool or on the toilet paper. If you underwent a bowel prep for your procedure, you may not have a normal bowel movement for a few days.  Please Note:  You might notice some irritation and congestion in your nose or some drainage.  This is from the oxygen used during your procedure.  There is no need for concern and it should clear up in a day or so.  SYMPTOMS TO REPORT IMMEDIATELY:   Following lower endoscopy (colonoscopy or flexible sigmoidoscopy):  Excessive amounts of blood in the stool  Significant tenderness or worsening of abdominal pains  Swelling of the abdomen that is new, acute  Fever of 100F or higher  For urgent or emergent issues, a gastroenterologist can be reached at any hour by calling [phone].   DIET:  We do recommend a small meal at first, but then you may proceed to your regular diet.  Drink plenty of fluids but you should avoid alcoholic beverages for 24 hours.  ACTIVITY:  You should plan to take it easy for the rest of today and you should NOT DRIVE or use heavy machinery until tomorrow (because of the sedation medicines used during the test).     FOLLOW UP: Our staff will call the number listed on your records 48-72 hours following your procedure to check on you and address any questions or concerns that you may have regarding the information given to you following your procedure. If we do not reach you, we will leave a message.  We will attempt to reach you two times.  During this call, we will ask if you have developed any symptoms of COVID 19. If you develop any symptoms (ie: fever, flu-like symptoms, shortness of breath, cough etc.) before then, please call [phone].  If you test positive for Covid 19 in the 2 weeks post procedure, please call and report this information to us.    If any biopsies were taken you will be contacted by phone or by letter within the next 1-3 weeks.  Please call us at [phone] if you have not heard about the biopsies in 3 weeks.    SIGNATURES/CONFIDENTIALITY: You and/or your care partner have signed paperwork which will be entered into your electronic medical record.  These signatures attest to the fact that that the information above on your After Visit Summary has been reviewed and is understood.  Full responsibility of the confidentiality of this discharge information lies with you and/or your care-partner. 

## 2018-12-14 NOTE — Op Note (Signed)
[name] Procedure Date: 12/14/2018 10:38 AM MRN: [id number] Endoscopist: Ladene Artist , MD Age: 73 Referring MD:  Date of Birth: 1946-03-11 Gender: Female Account #: [id number] Procedure:                Colonoscopy Indications:              Screening for colorectal malignant neoplasm Medicines:                Monitored Anesthesia Care Procedure:                Pre-Anesthesia Assessment:                           - Prior to the procedure, a History and Physical                            was performed, and patient medications and                            allergies were reviewed. The patient's tolerance of                            previous anesthesia was also reviewed. The risks                            and benefits of the procedure and the sedation                            options and risks were discussed with the patient.                            All questions were answered, and informed consent                            was obtained. Prior Anticoagulants: The patient has                            taken no previous anticoagulant or antiplatelet                            agents. ASA Grade Assessment: II - A patient with                            mild systemic disease. After reviewing the risks                            and benefits, the patient was deemed in                            satisfactory condition to undergo the procedure.                           After obtaining informed consent, the colonoscope  was passed under direct vision. Throughout the                            procedure, the patient's blood pressure, pulse, and                            oxygen saturations were monitored continuously. The                            Colonoscope was introduced through the anus and                            advanced to the the cecum, identified by                            appendiceal orifice and  ileocecal valve. The                            ileocecal valve, appendiceal orifice, and rectum                            were photographed. The quality of the bowel                            preparation was good. The colonoscopy was performed                            without difficulty. The patient tolerated the                            procedure well. Scope In: 10:50:11 AM Scope Out: 11:07:40 AM Scope Withdrawal Time: 0 hours 10 minutes 39 seconds  Total Procedure Duration: 0 hours 17 minutes 29 seconds  Findings:                 The perianal and digital rectal examinations were                            normal.                           Internal hemorrhoids were found during                            retroflexion. The hemorrhoids were small and Grade                            I (internal hemorrhoids that do not prolapse).                           The exam was otherwise without abnormality on                            direct and retroflexion views. Complications:            No immediate complications. Estimated blood loss:  None. Estimated Blood Loss:     Estimated blood loss: none. Impression:               - Internal hemorrhoids.                           - The examination was otherwise normal on direct                            and retroflexion views.                           - No specimens collected. Recommendation:           - Patient has a contact number available for                            emergencies. The signs and symptoms of potential                            delayed complications were discussed with the                            patient. Return to normal activities tomorrow.                            Written discharge instructions were provided to the                            patient.                           - Resume previous diet.                           - Continue present medications.                           - No  repeat colonoscopy due to age and the absence                            of colonic polyps. Ladene Artist, MD 12/14/2018 11:11:45 AM This report has been signed electronically.

## 2018-12-18 ENCOUNTER — Telehealth: Payer: Self-pay

## 2018-12-18 NOTE — Telephone Encounter (Signed)
  Follow up Call-  Call back number 12/14/2018  Post procedure Call Back phone  # [phone]  Permission to leave phone message Yes  Some recent data might be hidden     Patient questions:  Do you have a fever, pain , or abdominal swelling? No. Pain Score  0 *  Have you tolerated food without any problems? Yes.    Have you been able to return to your normal activities? Yes.    Do you have any questions about your discharge instructions: Diet   No. Medications  No. Follow up visit  No.  Do you have questions or concerns about your Care? No.  Actions: * If pain score is 4 or above: No action needed, pain <4.  1. Have you developed a fever since your procedure? No  2.   Have you had an respiratory symptoms (SOB or cough) since your procedure? No  3.   Have you tested positive for COVID 19 since your procedure No  4.   Have you had any family members/close contacts diagnosed with the COVID 19 since your procedure?  No   If yes to any of these questions please route to Joylene John, RN and Alphonsa Gin, RN.

## 2018-12-21 ENCOUNTER — Other Ambulatory Visit: Payer: Self-pay | Admitting: Family Medicine

## 2018-12-21 DIAGNOSIS — M810 Age-related osteoporosis without current pathological fracture: Secondary | ICD-10-CM

## 2018-12-21 NOTE — Telephone Encounter (Signed)
Medication: alendronate (FOSAMAX) 70 MG tablet [[id number]]   Has the patient contacted their pharmacy? Yes  (Agent: If no, request that the patient contact the pharmacy for the refill.) (Agent: If yes, when and what did the pharmacy advise?)  Preferred Pharmacy (with phone number or street name): [id number] - [place], [place] [address] [phone] (Phone) [phone] (Fax)    Agent: Please be advised that RX refills may take up to 3 business days. We ask that you follow-up with your pharmacy.

## 2018-12-22 MED ORDER — ALENDRONATE SODIUM 70 MG PO TABS: 70.0000 mg | ORAL_TABLET | ORAL | 0 refills | Status: DC

## 2018-12-22 NOTE — Telephone Encounter (Signed)
Med refilled for 30 day supply. Patient needs appointment for further refills.

## 2019-01-01 ENCOUNTER — Ambulatory Visit (HOSPITAL_BASED_OUTPATIENT_CLINIC_OR_DEPARTMENT_OTHER)
Admission: RE | Admit: 2019-01-01 | Discharge: 2019-01-01 | Disposition: A | Discharge status: Home / Self Care | Payer: Medicare HMO | Source: Ambulatory Visit | Attending: Family Medicine | Admitting: Family Medicine

## 2019-01-01 ENCOUNTER — Other Ambulatory Visit: Payer: Self-pay

## 2019-01-01 ENCOUNTER — Encounter (HOSPITAL_BASED_OUTPATIENT_CLINIC_OR_DEPARTMENT_OTHER): Payer: Self-pay

## 2019-01-01 DIAGNOSIS — Z780 Asymptomatic menopausal state: Secondary | ICD-10-CM

## 2019-01-01 DIAGNOSIS — Z122 Encounter for screening for malignant neoplasm of respiratory organs: Secondary | ICD-10-CM | POA: Insufficient documentation

## 2019-01-01 DIAGNOSIS — J432 Centrilobular emphysema: Secondary | ICD-10-CM | POA: Diagnosis not present

## 2019-01-01 DIAGNOSIS — I2510 Atherosclerotic heart disease of native coronary artery without angina pectoris: Secondary | ICD-10-CM | POA: Insufficient documentation

## 2019-01-01 DIAGNOSIS — I700 Atherosclerosis of aorta: Secondary | ICD-10-CM | POA: Diagnosis not present

## 2019-01-01 DIAGNOSIS — Z1231 Encounter for screening mammogram for malignant neoplasm of breast: Secondary | ICD-10-CM | POA: Diagnosis not present

## 2019-01-01 DIAGNOSIS — F17200 Nicotine dependence, unspecified, uncomplicated: Secondary | ICD-10-CM

## 2019-01-01 DIAGNOSIS — Z87891 Personal history of nicotine dependence: Secondary | ICD-10-CM | POA: Diagnosis not present

## 2019-01-01 DIAGNOSIS — M8589 Other specified disorders of bone density and structure, multiple sites: Secondary | ICD-10-CM | POA: Diagnosis not present

## 2019-01-01 DIAGNOSIS — Z9882 Breast implant status: Secondary | ICD-10-CM | POA: Insufficient documentation

## 2019-01-08 ENCOUNTER — Ambulatory Visit (INDEPENDENT_AMBULATORY_CARE_PROVIDER_SITE_OTHER): Payer: Medicare HMO | Admitting: Family Medicine

## 2019-01-08 ENCOUNTER — Other Ambulatory Visit: Payer: Self-pay | Admitting: Family Medicine

## 2019-01-08 ENCOUNTER — Encounter: Payer: Self-pay | Admitting: Family Medicine

## 2019-01-08 ENCOUNTER — Other Ambulatory Visit: Payer: Self-pay

## 2019-01-08 VITALS — BP 120/84 | HR 78 | Temp 97.7°F | Ht 66.0 in | Wt 177.4 lb

## 2019-01-08 DIAGNOSIS — M810 Age-related osteoporosis without current pathological fracture: Secondary | ICD-10-CM

## 2019-01-08 DIAGNOSIS — R7303 Prediabetes: Secondary | ICD-10-CM | POA: Diagnosis not present

## 2019-01-08 DIAGNOSIS — I700 Atherosclerosis of aorta: Secondary | ICD-10-CM | POA: Diagnosis not present

## 2019-01-08 DIAGNOSIS — Z23 Encounter for immunization: Secondary | ICD-10-CM

## 2019-01-08 LAB — COMPREHENSIVE METABOLIC PANEL
ALT: 11 U/L (ref 0–35)
AST: 14 U/L (ref 0–37)
Albumin: 3.9 g/dL (ref 3.5–5.2)
Alkaline Phosphatase: 86 U/L (ref 39–117)
BUN: 15 mg/dL (ref 6–23)
CO2: 29 mEq/L (ref 19–32)
Calcium: 9 mg/dL (ref 8.4–10.5)
Chloride: 105 mEq/L (ref 96–112)
Creatinine, Ser: 0.63 mg/dL (ref 0.40–1.20)
GFR: 92.93 mL/min (ref >60.00)
Glucose, Bld: 93 mg/dL (ref 70–99)
Potassium: 4.2 mEq/L (ref 3.5–5.1)
Sodium: 143 mEq/L (ref 135–145)
Total Bilirubin: 0.5 mg/dL (ref 0.2–1.2)
Total Protein: 6.6 g/dL (ref 6.0–8.3)

## 2019-01-08 LAB — LIPID PANEL
Cholesterol: 221 mg/dL — ABNORMAL HIGH (ref 0–200)
HDL: 64.1 mg/dL (ref >39.00)
LDL Cholesterol: 140 mg/dL — ABNORMAL HIGH (ref 0–99)
NonHDL: 157.35
Total CHOL/HDL Ratio: 3
Triglycerides: 86 mg/dL (ref 0.0–149.0)
VLDL: 17.2 mg/dL (ref 0.0–40.0)

## 2019-01-08 LAB — VITAMIN D 25 HYDROXY (VIT D DEFICIENCY, FRACTURES): VITD: 10 ng/mL — ABNORMAL LOW (ref 30.00–100.00)

## 2019-01-08 LAB — HEMOGLOBIN A1C: Hgb A1c MFr Bld: 5.7 % (ref 4.6–6.5)

## 2019-01-08 MED ORDER — VITAMIN D (ERGOCALCIFEROL) 1.25 MG (50000 UNIT) PO CAPS: 50000.0000 [IU] | ORAL_CAPSULE | ORAL | 0 refills | Status: DC

## 2019-01-08 MED ORDER — ALENDRONATE SODIUM 70 MG PO TABS: 70.0000 mg | ORAL_TABLET | ORAL | 3 refills | Status: DC

## 2019-01-08 NOTE — Addendum Note (Signed)
Addended by: Sharon Seller B on: 01/08/2019 08:41 AM   Modules accepted: Orders

## 2019-01-08 NOTE — Patient Instructions (Addendum)
Consider yoga on YouTube as a replacement for the gym until things open.   Give [place] 2-3 business days to get the results of your labs back.   Take 1200 mg of Ca and 1000 u of Vit D daily.   I recommend getting the flu shot in mid October. This suggestion would change if the CDC comes out with a different recommendation.   Let us know if you need anything.

## 2019-01-08 NOTE — Progress Notes (Signed)
Chief Complaint  Patient presents with  . Results    Bone density    Subjective: Patient is a 73 y.o. female here for f/u osteoporosis.  She was dx'd with osteoprosis 2 years ago . Remembering to take it every week. She is not currently taking Vit D and Ca. Last DEXA scan showed improvement in her bone density. She walks a lot at work. No other routine exercise.  Hx of prediabetes based on A1c. Had lost 25 lbs by cleaning up diet since that time. Feels much better and curioust o see what her sugars are.  Former smoker, just had low dose CT scan of chest that showed aortic atherosclerosis. She is not on a statin. She had chelation therapy and would like to go back on this.    ROS: Heart: Denies chest pain  Lungs: Denies SOB   Past Medical History:  Diagnosis Date  . Aortic atherosclerosis ([place])    CT 2019  . History of chicken pox   . History of shingles   . Osteoporosis     Objective: BP 120/84 (BP Location: Left Arm, Patient Position: Sitting, Cuff Size: Normal)   Pulse 78   Temp 97.7 F (36.5 C) (Temporal)   Ht 5\' 6"  (1.676 m)   Wt 177 lb 6 oz (80.5 kg)   SpO2 93%   BMI 28.63 kg/m  General: Awake, appears stated age HEENT: MMM, EOMi Heart: RRR, no bruits, no LE edema Lungs: CTAB, no rales, wheezes or rhonchi. No accessory muscle use Psych: Age appropriate judgment and insight, normal affect and mood  Assessment and Plan: Osteoporosis without current pathological fracture, unspecified osteoporosis type - Plan: Comprehensive metabolic panel, Vitamin D (25 hydroxy), alendronate (FOSAMAX) 70 MG tablet  Prediabetes - Plan: Hemoglobin A1c  Aortic atherosclerosis (HCC) - Plan: Lipid panel  1- Cont Fosamax. Consider yoga.  2- Counseled on diet and exercise. She is doing well. 3- Offered statin. She declined as she would like to trial 1 year of chelation and see what things are in a year. She was noted that the literature does not support chelation for this issue and  statins are mainstay of therapy. She was warned this could lead to issues such as stroke, heart attack, disability and even death. She voiced awareness of these risks.  F/u in 1 yr.  The patient voiced understanding and agreement to the plan.  [place], DO 01/08/19  8:33 AM

## 2019-01-09 ENCOUNTER — Other Ambulatory Visit: Payer: Self-pay | Admitting: Family Medicine

## 2019-01-09 DIAGNOSIS — E785 Hyperlipidemia, unspecified: Secondary | ICD-10-CM

## 2019-01-09 DIAGNOSIS — E559 Vitamin D deficiency, unspecified: Secondary | ICD-10-CM

## 2019-04-09 ENCOUNTER — Other Ambulatory Visit: Payer: Self-pay

## 2019-04-09 ENCOUNTER — Other Ambulatory Visit (INDEPENDENT_AMBULATORY_CARE_PROVIDER_SITE_OTHER): Payer: Medicare HMO

## 2019-04-09 ENCOUNTER — Other Ambulatory Visit: Payer: Self-pay | Admitting: Family Medicine

## 2019-04-09 DIAGNOSIS — E785 Hyperlipidemia, unspecified: Secondary | ICD-10-CM

## 2019-04-09 DIAGNOSIS — E559 Vitamin D deficiency, unspecified: Secondary | ICD-10-CM | POA: Diagnosis not present

## 2019-04-09 LAB — LIPID PANEL
Cholesterol: 241 mg/dL — ABNORMAL HIGH (ref 0–200)
HDL: 66.9 mg/dL (ref >39.00)
LDL Cholesterol: 158 mg/dL — ABNORMAL HIGH (ref 0–99)
NonHDL: 174.25
Total CHOL/HDL Ratio: 4
Triglycerides: 81 mg/dL (ref 0.0–149.0)
VLDL: 16.2 mg/dL (ref 0.0–40.0)

## 2019-04-09 LAB — VITAMIN D 25 HYDROXY (VIT D DEFICIENCY, FRACTURES): VITD: 23.17 ng/mL — ABNORMAL LOW (ref 30.00–100.00)

## 2019-04-09 MED ORDER — VITAMIN D (ERGOCALCIFEROL) 1.25 MG (50000 UNIT) PO CAPS: 50000.0000 [IU] | ORAL_CAPSULE | ORAL | 0 refills | Status: DC

## 2019-06-25 DIAGNOSIS — L400 Psoriasis vulgaris: Secondary | ICD-10-CM | POA: Diagnosis not present

## 2019-11-28 NOTE — Progress Notes (Signed)
I connected with Kelly Henderson today by telephone and verified that I am speaking with the correct person using two identifiers. Location patient: home Location provider: work Persons participating in the virtual visit: patient, Marine scientist.    I discussed the limitations, risks, security and privacy concerns of performing an evaluation and management service by telephone and the availability of in person appointments. I also discussed with the patient that there may be a patient responsible charge related to this service. The patient expressed understanding and verbally consented to this telephonic visit.    Interactive audio and video telecommunications were attempted between this provider and patient, however failed, due to patient having technical difficulties OR patient did not have access to video capability.  We continued and completed visit with audio only.  Some vital signs may be absent or patient reported.    Subjective:   Kelly Henderson is a 74 y.o. female who presents for Medicare Annual (Subsequent) preventive examination.  Review of Systems    Cardiac Risk Factors include: advanced age (>44men, >1 women);smoking/ tobacco exposure     Objective:    Advanced Directives 11/29/2019 11/23/2018 09/19/2017 09/16/2016  Does Patient Have a Medical Advance Directive? Yes Yes Yes Yes  Type of Paramedic of [place];Living will [place];Living will [place];Living will [place];Living will  Does patient want to make changes to medical advance directive? No - Patient declined No - Patient declined No - Patient declined -  Copy of [place] in Chart? No - copy requested No - copy requested No - copy requested No - copy requested    Current Medications (verified) Outpatient Encounter Medications as of 11/29/2019  Medication Sig  . alendronate (FOSAMAX) 70 MG tablet Take 1 tablet (70 mg total) by  mouth once a week. NEEDS OV FOR FURTHER REFILLS  . Vitamin D, Ergocalciferol, (DRISDOL) 1.25 MG (50000 UT) CAPS capsule Take 1 capsule (50,000 Units total) by mouth every 7 (seven) days.   No facility-administered encounter medications on file as of 11/29/2019.    Allergies (verified) Latex and Morphine and related   History: Past Medical History:  Diagnosis Date  . Aortic atherosclerosis ([place])    CT 2019  . History of chicken pox   . History of shingles   . Osteoporosis    Past Surgical History:  Procedure Laterality Date  . ABDOMINAL HYSTERECTOMY    . AUGMENTATION MAMMAPLASTY Bilateral    Silicone, in front of muscle  . BREAST BIOPSY     Patient has had 3 biopsy  . BREAST ENHANCEMENT SURGERY  1975  . TONSILLECTOMY     Family History  Problem Relation Age of Onset  . Cancer Mother        Uterus  . Alcohol abuse Father   . Heart disease Father   . Heart attack Son   . Colon polyps Neg Hx   . Colon cancer Neg Hx   . Esophageal cancer Neg Hx   . Rectal cancer Neg Hx   . Stomach cancer Neg Hx    Social History   Socioeconomic History  . Marital status: Married    Spouse name: Not on file  . Number of children: Not on file  . Years of education: Not on file  . Highest education level: Not on file  Occupational History  . Not on file  Tobacco Use  . Smoking status: Former Smoker    Packs/day: 2.00  Years: 50.00    Pack years: 100.00    Types: Cigarettes    Quit date: 07/01/2016    Years since quitting: 3.4  . Smokeless tobacco: Never Used  Vaping Use  . Vaping Use: Never used  Substance and Sexual Activity  . Alcohol use: No  . Drug use: No  . Sexual activity: Yes    Birth control/protection: Surgical  Other Topics Concern  . Not on file  Social History Narrative  . Not on file   Social Determinants of Health   Financial Resource Strain: Low Risk   . Difficulty of Paying Living Expenses: Not hard at all  Food Insecurity: No Food Insecurity  .  Worried About Charity fundraiser in the Last Year: Never true  . Ran Out of Food in the Last Year: Never true  Transportation Needs: No Transportation Needs  . Lack of Transportation (Medical): No  . Lack of Transportation (Non-Medical): No  Physical Activity:   . Days of Exercise per Week:   . Minutes of Exercise per Session:   Stress:   . Feeling of Stress :   Social Connections:   . Frequency of Communication with Friends and Family:   . Frequency of Social Gatherings with Friends and Family:   . Attends Religious Services:   . Active Member of Clubs or Organizations:   . Attends Archivist Meetings:   Marland Kitchen Marital Status:     Tobacco Counseling Counseling given: Not Answered   Clinical Intake: Pain : No/denies pain     Activities of Daily Living In your present state of health, do you have any difficulty performing the following activities: 11/29/2019  Hearing? N  Vision? N  Difficulty concentrating or making decisions? N  Walking or climbing stairs? N  Dressing or bathing? N  Doing errands, shopping? N  Preparing Food and eating ? N  Using the Toilet? N  In the past six months, have you accidently leaked urine? N  Do you have problems with loss of bowel control? N  Managing your Medications? N  Managing your Finances? N  Housekeeping or managing your Housekeeping? N  Some recent data might be hidden    Patient Care Team: Shelda Pal, DO as PCP - General (Family Medicine)  Indicate any recent Medical Services you may have received from other than [facility] providers in the past year (date may be approximate).     Assessment:   This is a routine wellness examination for [facility].   Dietary issues and exercise activities discussed: Current Exercise Habits: The patient does not participate in regular exercise at present, Exercise limited by: None identified Diet (meal preparation, eat out, water intake, caffeinated beverages, dairy products,  fruits and vegetables): well balanced      Goals    .  Begin exercising at [facility] (pt-stated)      Pt states she will begin this month going twice each week on her days off.    .  Maintain current health and not start smoking again.      Depression Screen PHQ 2/9 Scores 11/29/2019 11/23/2018 09/19/2017 09/16/2016 08/19/2016  PHQ - 2 Score 0 0 0 0 0  PHQ- 9 Score - - - - 1    Fall Risk Fall Risk  11/29/2019 11/23/2018 09/19/2017 09/16/2016 08/19/2016  Falls in the past year? 0 0 No No No  Number falls in past yr: 0 - - - -  Injury with Fall? 0 - - - -  Follow up Education provided;Falls prevention discussed - - - -   Lives in 2 story home.  Any stairs in or around the home? Yes  If so, are there any without handrails? No  Home free of loose throw rugs in walkways, pet beds, electrical cords, etc? Yes  Adequate lighting in your home to reduce risk of falls? Yes   ASSISTIVE DEVICES UTILIZED TO PREVENT FALLS: none   Cognitive Function: Ad8 score reviewed for issues:  Issues making decisions:no  Less interest in hobbies / activities:no  Repeats questions, stories (family complaining):no  Trouble using ordinary gadgets (microwave, computer, phone):no  Forgets the month or year: no  Mismanaging finances: no  Remembering appts:no  Daily problems with thinking and/or memory:no Ad8 score is=0        Immunizations Immunization History  Administered Date(s) Administered  . Moderna SARS-COVID-2 Vaccination 06/13/2019, 07/11/2019  . Pneumococcal Conjugate-13 08/19/2016  . Pneumococcal Polysaccharide-23 01/08/2019  . Td 11/07/2008  . Zoster Recombinat (Shingrix) 08/19/2016, 03/03/2017    Pneumococcal vaccine status: Up to date Covid-19 vaccine status: Completed vaccines  Qualifies for Shingles Vaccine? Yes    Shingrix Completed?: Yes  Screening Tests Health Maintenance  Topic Date Due  . TETANUS/TDAP  11/08/2018  . INFLUENZA VACCINE  12/09/2019  . MAMMOGRAM  12/31/2020   . DEXA SCAN  Completed  . COVID-19 Vaccine  Completed  . Hepatitis C Screening  Completed  . PNA vac Low Risk Adult  Completed    Health Maintenance  Health Maintenance Due  Topic Date Due  . TETANUS/TDAP  11/08/2018    Colorectal cancer screening: Completed 12/14/18. Repeat every (no recall recommended) years Mammogram status: Completed 01/01/19. Repeat every year Bone Density status: Completed 01/01/19. Results reflect: Bone density results: OSTEOPENIA. Repeat every 2 years.  Lung Cancer Screening: Done 01/01/19  Additional Screening:  Vision Screening: Recommended annual ophthalmology exams for early detection of glaucoma and other disorders of the eye.  Dental Screening: Recommended annual dental exams for proper oral hygiene  Community Resource Referral / Chronic Care Management: CRR required this visit?  No   CCM required this visit?  No      Plan:    See you next year!  Continue to eat heart healthy diet (full of fruits, vegetables, whole grains, lean protein, water--limit salt, fat, and sugar intake) and increase physical activity as tolerated.  Continue doing brain stimulating activities (puzzles, reading, adult coloring books, staying active) to keep memory sharp.   Bring a copy of your living will and/or healthcare power of attorney to your next office visit.   I have personally reviewed and noted the following in the patient's chart:   . Medical and social history . Use of alcohol, tobacco or illicit drugs  . Current medications and supplements . Functional ability and status . Nutritional status . Physical activity . Advanced directives . List of other physicians . Hospitalizations, surgeries, and ER visits in previous 12 months . Vitals . Screenings to include cognitive, depression, and falls . Referrals and appointments  In addition, I have reviewed and discussed with patient certain preventive protocols, quality metrics, and best practice  recommendations. A written personalized care plan for preventive services as well as general preventive health recommendations were provided to patient.   Due to this being a telephonic visit, the after visit summary with patients personalized plan was offered to patient via mail or my-chart.Patient would like to access on my-chart.   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 Has a german sheppard and a cat.

## 2019-11-29 ENCOUNTER — Ambulatory Visit (INDEPENDENT_AMBULATORY_CARE_PROVIDER_SITE_OTHER): Payer: Medicare HMO | Admitting: *Deleted

## 2019-11-29 ENCOUNTER — Encounter: Payer: Self-pay | Admitting: *Deleted

## 2019-11-29 ENCOUNTER — Other Ambulatory Visit: Payer: Self-pay

## 2019-11-29 DIAGNOSIS — Z0000 Encounter for general adult medical examination without abnormal findings: Secondary | ICD-10-CM

## 2019-11-29 NOTE — Patient Instructions (Signed)
See you next year!  Continue to eat heart healthy diet (full of fruits, vegetables, whole grains, lean protein, water--limit salt, fat, and sugar intake) and increase physical activity as tolerated.  Continue doing brain stimulating activities (puzzles, reading, adult coloring books, staying active) to keep memory sharp.   Bring a copy of your living will and/or healthcare power of attorney to your next office visit.   Ms. Kelly Henderson , Thank you for taking time to come for your Medicare Wellness Visit. I appreciate your ongoing commitment to your health goals. Please review the following plan we discussed and let me know if I can assist you in the future.   These are the goals we discussed: Goals    .  Begin exercising at [facility] (pt-stated)      Pt states she will begin this month going twice each week on her days off.    .  Maintain current health and not start smoking again.       This is a list of the screening recommended for you and due dates:  Health Maintenance  Topic Date Due  . Tetanus Vaccine  11/08/2018  . Flu Shot  12/09/2019  . Mammogram  12/31/2020  . DEXA scan (bone density measurement)  Completed  . COVID-19 Vaccine  Completed  .  Hepatitis C: One time screening is recommended by Center for Disease Control  (CDC) for  adults born from [birth year] through 1964.   Completed  . Pneumonia vaccines  Completed    Preventive Care 73 Years and Older, Female Preventive care refers to lifestyle choices and visits with your health care provider that can promote health and wellness. This includes:  A yearly physical exam. This is also called an annual well check.  Regular dental and eye exams.  Immunizations.  Screening for certain conditions.  Healthy lifestyle choices, such as diet and exercise. What can I expect for my preventive care visit? Physical exam Your health care provider will check:  Height and weight. These may be used to calculate body mass index (BMI), which is  a measurement that tells if you are at a healthy weight.  Heart rate and blood pressure.  Your skin for abnormal spots. Counseling Your health care provider may ask you questions about:  Alcohol, tobacco, and drug use.  Emotional well-being.  Home and relationship well-being.  Sexual activity.  Eating habits.  History of falls.  Memory and ability to understand (cognition).  Work and work Statistician.  Pregnancy and menstrual history. What immunizations do I need?  Influenza (flu) vaccine  This is recommended every year. Tetanus, diphtheria, and pertussis (Tdap) vaccine  You may need a Td booster every 10 years. Varicella (chickenpox) vaccine  You may need this vaccine if you have not already been vaccinated. Zoster (shingles) vaccine  You may need this after age 73. Pneumococcal conjugate (PCV13) vaccine  One dose is recommended after age 73. Pneumococcal polysaccharide (PPSV23) vaccine  One dose is recommended after age 73. Measles, mumps, and rubella (MMR) vaccine  You may need at least one dose of MMR if you were born in 1957 or later. You may also need a second dose. Meningococcal conjugate (MenACWY) vaccine  You may need this if you have certain conditions. Hepatitis A vaccine  You may need this if you have certain conditions or if you travel or work in places where you may be exposed to hepatitis A. Hepatitis B vaccine  You may need this if you have certain conditions or  if you travel or work in places where you may be exposed to hepatitis B. Haemophilus influenzae type b (Hib) vaccine  You may need this if you have certain conditions. You may receive vaccines as individual doses or as more than one vaccine together in one shot (combination vaccines). Talk with your health care provider about the risks and benefits of combination vaccines. What tests do I need? Blood tests  Lipid and cholesterol levels. These may be checked every 5 years, or more  frequently depending on your overall health.  Hepatitis C test.  Hepatitis B test. Screening  Lung cancer screening. You may have this screening every year starting at age 73 if you have a 30-pack-year history of smoking and currently smoke or have quit within the past 15 years.  Colorectal cancer screening. All adults should have this screening starting at age 73 and continuing until age 60. Your health care provider may recommend screening at age 64 if you are at increased risk. You will have tests every 1-10 years, depending on your results and the type of screening test.  Diabetes screening. This is done by checking your blood sugar (glucose) after you have not eaten for a while (fasting). You may have this done every 1-3 years.  Mammogram. This may be done every 1-2 years. Talk with your health care provider about how often you should have regular mammograms.  BRCA-related cancer screening. This may be done if you have a family history of breast, ovarian, tubal, or peritoneal cancers. Other tests  Sexually transmitted disease (STD) testing.  Bone density scan. This is done to screen for osteoporosis. You may have this done starting at age 17. Follow these instructions at home: Eating and drinking  Eat a diet that includes fresh fruits and vegetables, whole grains, lean protein, and low-fat dairy products. Limit your intake of foods with high amounts of sugar, saturated fats, and salt.  Take vitamin and mineral supplements as recommended by your health care provider.  Do not drink alcohol if your health care provider tells you not to drink.  If you drink alcohol: ? Limit how much you have to 0-1 drink a day. ? Be aware of how much alcohol is in your drink. In the [place] one drink equals one 12 oz bottle of beer (355 mL), one 5 oz glass of wine (148 mL), or one 1 oz glass of hard liquor (44 mL). Lifestyle  Take daily care of your teeth and gums.  Stay active. Exercise for at  least 30 minutes on 5 or more days each week.  Do not use any products that contain nicotine or tobacco, such as cigarettes, e-cigarettes, and chewing tobacco. If you need help quitting, ask your health care provider.  If you are sexually active, practice safe sex. Use a condom or other form of protection in order to prevent STIs (sexually transmitted infections).  Talk with your health care provider about taking a low-dose aspirin or statin. What's next?  Go to your health care provider once a year for a well check visit.  Ask your health care provider how often you should have your eyes and teeth checked.  Stay up to date on all vaccines. This information is not intended to replace advice given to you by your health care provider. Make sure you discuss any questions you have with your health care provider. Document Revised: 04/20/2018 Document Reviewed: 04/20/2018 Elsevier Patient Education  2020 [employer].

## 2019-12-19 ENCOUNTER — Other Ambulatory Visit: Payer: Self-pay | Admitting: Family Medicine

## 2019-12-19 DIAGNOSIS — Z122 Encounter for screening for malignant neoplasm of respiratory organs: Secondary | ICD-10-CM

## 2019-12-19 NOTE — Progress Notes (Signed)
CT

## 2020-01-09 ENCOUNTER — Other Ambulatory Visit: Payer: Self-pay

## 2020-01-09 ENCOUNTER — Encounter: Payer: Medicare HMO | Admitting: Family Medicine

## 2020-01-09 ENCOUNTER — Ambulatory Visit (HOSPITAL_BASED_OUTPATIENT_CLINIC_OR_DEPARTMENT_OTHER)
Admission: RE | Admit: 2020-01-09 | Discharge: 2020-01-09 | Disposition: A | Discharge status: Home / Self Care | Payer: Medicare HMO | Source: Ambulatory Visit | Attending: Family Medicine | Admitting: Family Medicine

## 2020-01-09 DIAGNOSIS — Z122 Encounter for screening for malignant neoplasm of respiratory organs: Secondary | ICD-10-CM | POA: Insufficient documentation

## 2020-01-09 DIAGNOSIS — I2510 Atherosclerotic heart disease of native coronary artery without angina pectoris: Secondary | ICD-10-CM | POA: Insufficient documentation

## 2020-01-09 DIAGNOSIS — Z87891 Personal history of nicotine dependence: Secondary | ICD-10-CM | POA: Diagnosis not present

## 2020-01-09 DIAGNOSIS — J439 Emphysema, unspecified: Secondary | ICD-10-CM | POA: Diagnosis not present

## 2020-01-09 DIAGNOSIS — I700 Atherosclerosis of aorta: Secondary | ICD-10-CM | POA: Insufficient documentation

## 2020-01-22 ENCOUNTER — Encounter: Payer: Self-pay | Admitting: Family Medicine

## 2020-01-22 ENCOUNTER — Other Ambulatory Visit: Payer: Self-pay

## 2020-01-22 ENCOUNTER — Telehealth (INDEPENDENT_AMBULATORY_CARE_PROVIDER_SITE_OTHER): Payer: Medicare HMO | Admitting: Family Medicine

## 2020-01-22 DIAGNOSIS — J439 Emphysema, unspecified: Secondary | ICD-10-CM | POA: Insufficient documentation

## 2020-01-22 DIAGNOSIS — I700 Atherosclerosis of aorta: Secondary | ICD-10-CM

## 2020-01-22 MED ORDER — ALBUTEROL SULFATE HFA 108 (90 BASE) MCG/ACT IN AERS: 2.0000 | INHALATION_SPRAY | Freq: Four times a day (QID) | RESPIRATORY_TRACT | 0 refills | Status: DC | PRN

## 2020-01-22 MED ORDER — ROSUVASTATIN CALCIUM 20 MG PO TABS: 20.0000 mg | ORAL_TABLET | Freq: Every day | ORAL | 3 refills | Status: DC

## 2020-01-22 NOTE — Progress Notes (Signed)
Chief Complaint  Patient presents with  . Results    Subjective: Patient is a 74 y.o. female here for CT f/u. Due to COVID-19 pandemic, we are interacting via telephone. I verified patient's ID using 2 identifiers. Patient agreed to proceed with visit via this method. Patient is at home, I am at office. Patient and I are present for visit.   Patient had a repeat CT chest screening for lung cancer earlier in the month.  It showed emphysema, coronary artery calcification, and aortic atherosclerosis.  I brought this to her attention 1 year ago but she decided she would rather try chelation therapy rather than go on a statin.  She did not do this but wishes to do it now.  She is not having any chest pain or shortness of breath out of the ordinary.  She understands that foregoing the statin in pursuit of chelation therapy which is not proven to lower risk of heart attack/stroke could result in such maladies or even death.  Past Medical History:  Diagnosis Date  . Aortic atherosclerosis ([place])    CT 2019  . History of chicken pox   . History of shingles   . Osteoporosis     Objective: No conversational dyspnea Age appropriate judgment and insight Nml affect and mood  Assessment and Plan: Aortic atherosclerosis ([facility]) - Plan: Lipid panel, Hepatic function panel, rosuvastatin (CRESTOR) 20 MG tablet  Pulmonary emphysema, unspecified emphysema type ([place]) - Plan: albuterol (VENTOLIN HFA) 108 (90 Base) MCG/ACT inhaler  1. Start statin. Ck labs in 6 weeks. Counseled on diet/exercise. Stop smoking. 2. SABA prn.  F/u as originally schedule.  Total time: 15 min The patient voiced understanding to the plan and the benefits that statins have been proven to provide.  She states she would rather pursue chelation therapy and see what the scan shows when she has a repeat CT next year.  She was warned this increases her risk of heart attack, stroke, peripheral arterial disease, and even death.  [place], DO 01/22/20  10:19 AM

## 2020-07-11 ENCOUNTER — Telehealth: Payer: Self-pay | Admitting: Family Medicine

## 2020-07-11 NOTE — Telephone Encounter (Signed)
LVM AWV appt change to 12/04/20 w/Martha Dorothyann Peng.  Please verify appt date.

## 2020-10-25 IMAGING — MG DIGITAL SCREENING BILATERAL MAMMOGRAM WITH IMPLANTS, CAD AND TOM
9 of 12 series · 9 of 28 positions shown · non-contrast
Comparison: Previous exam(s).

CLINICAL DATA: Screening.

EXAM:
DIGITAL SCREENING BILATERAL MAMMOGRAM WITH IMPLANTS, CAD AND TOMO
The patient has retroglandular implants. Standard and implant
displaced views were performed.

[R CC]
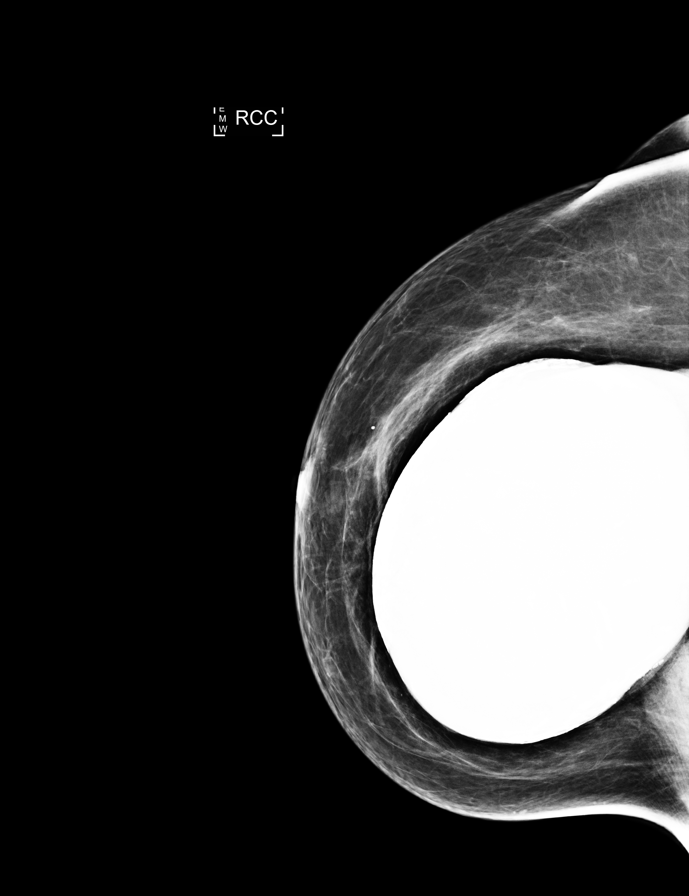

[R MLO]
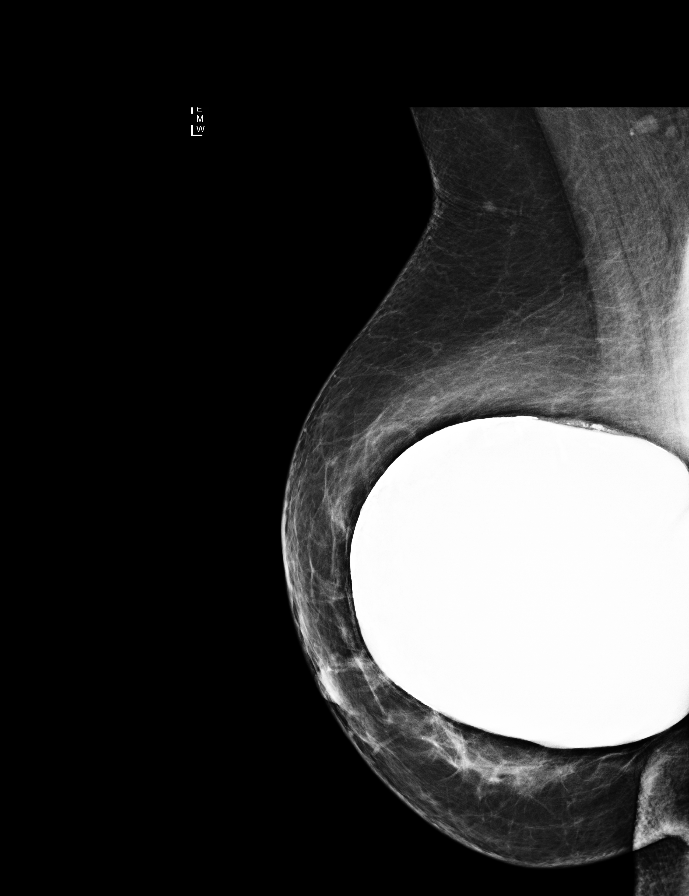

[L CC]
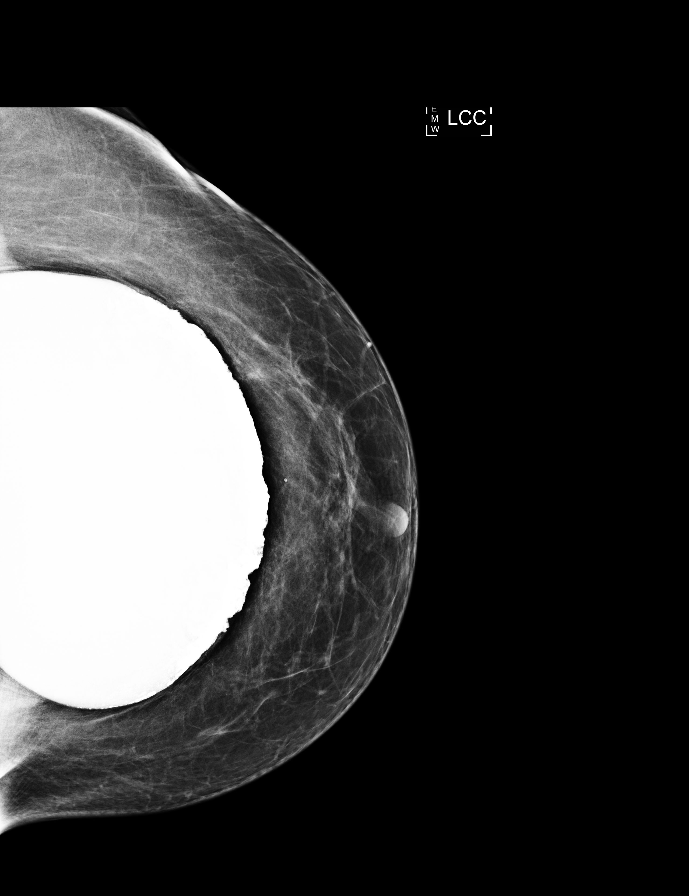

[L MLO]
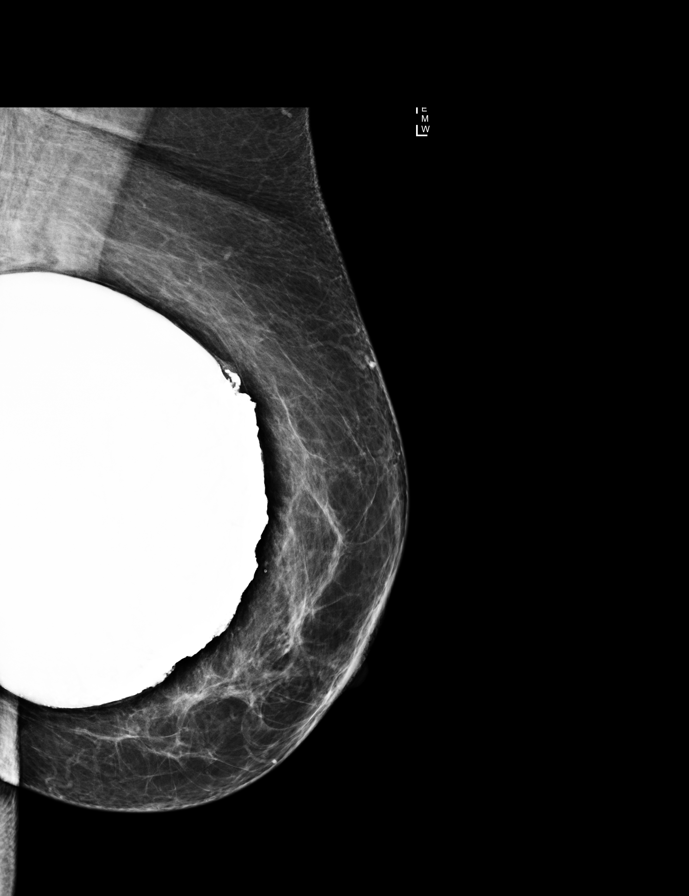

[R MLO synth-2D]
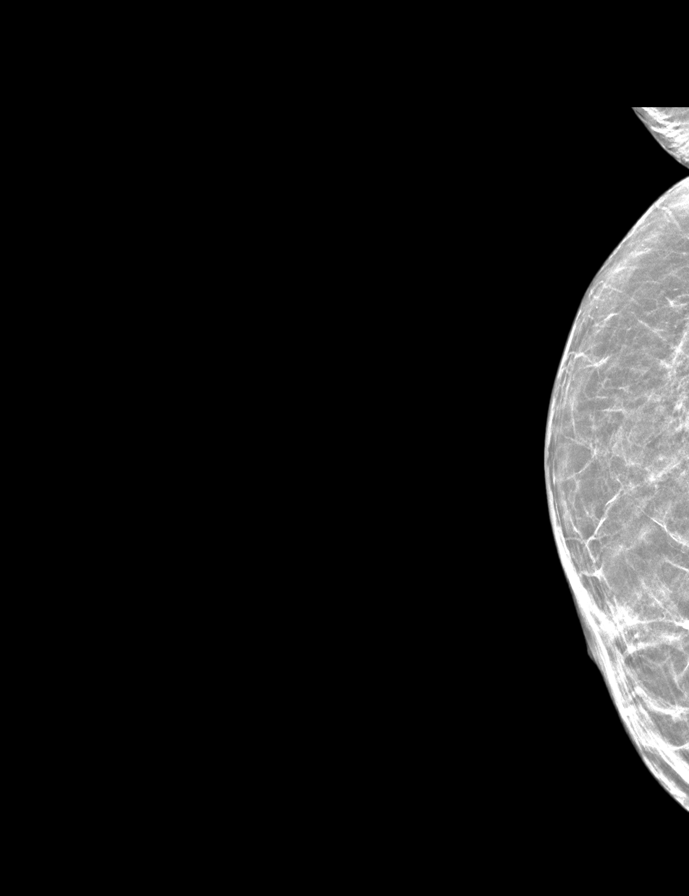

[L CC synth-2D]
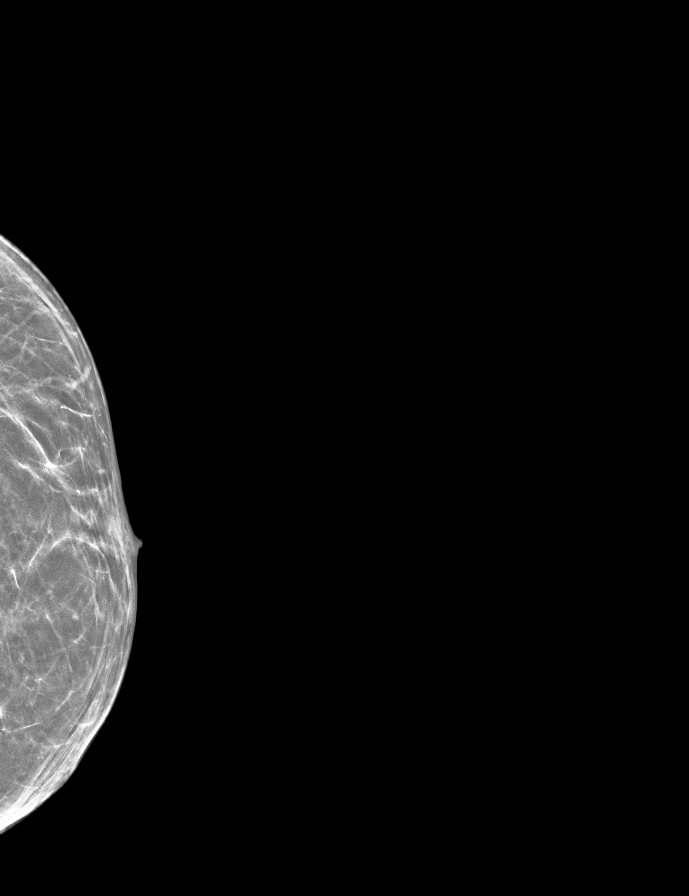

[R CC synth-2D]
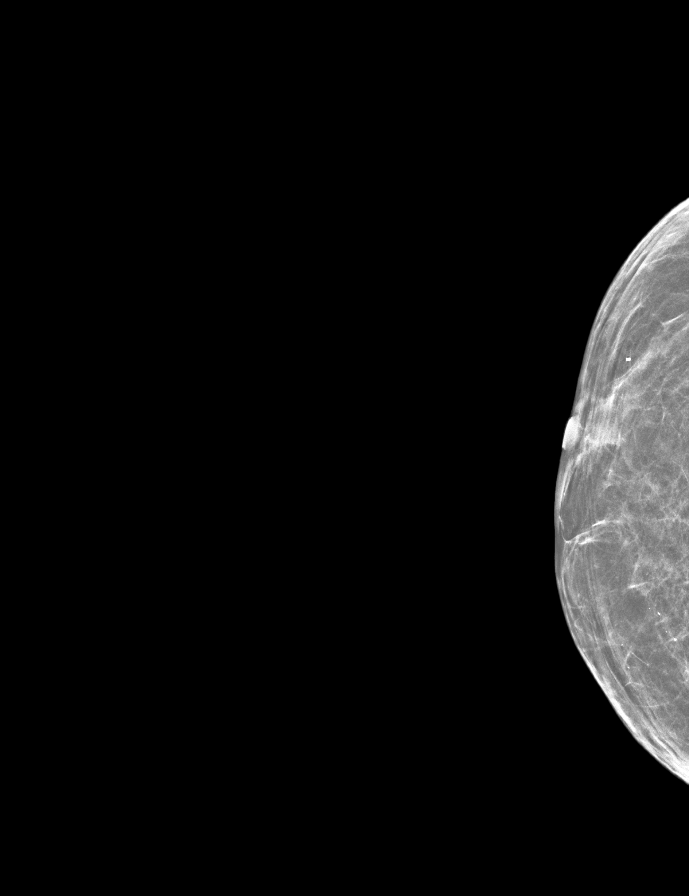

[L MLO synth-2D]
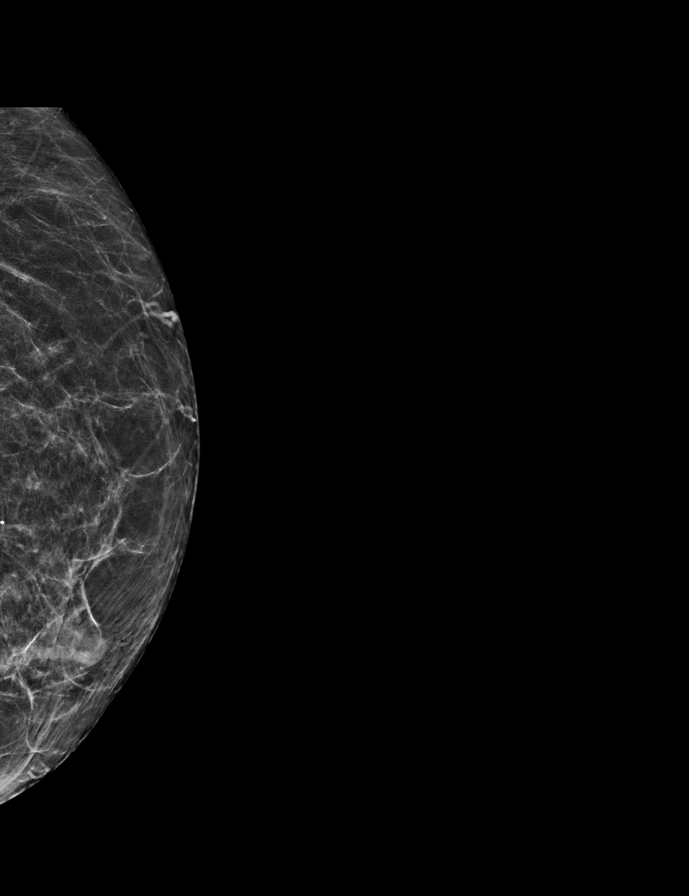

[L CCID BREAST TOMOSYNTHESIS IMAGE tomo · tomo slice 19/38.0]
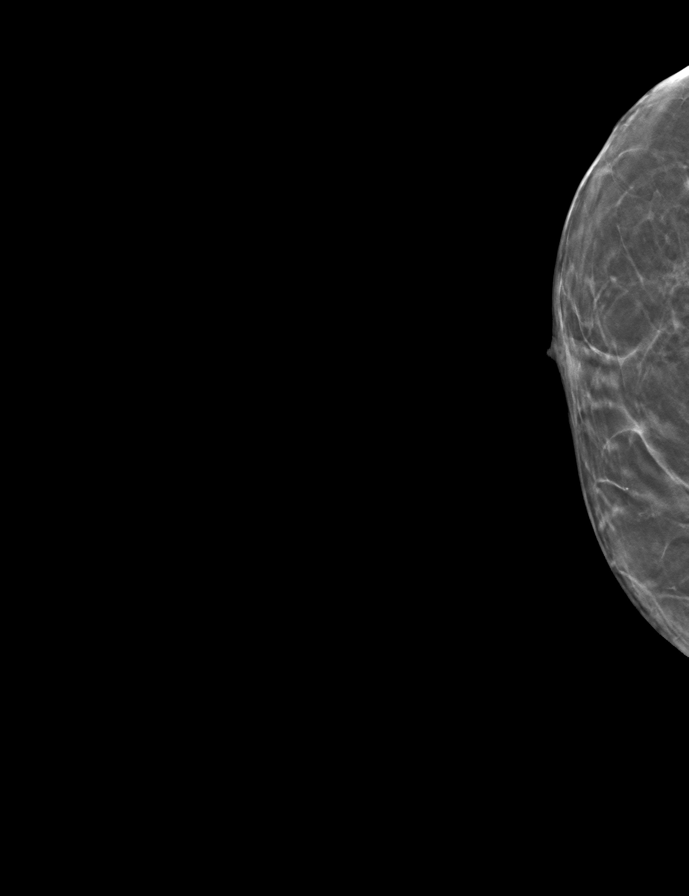

[9 of 28 positions shown; findings below may reference images not displayed]

ACR Breast Density Category b: There are scattered areas of
fibroglandular density.
FINDINGS: There are no findings suspicious for malignancy. Images were
processed with CAD.
IMPRESSION: No mammographic evidence of malignancy. A result letter of this
screening mammogram will be mailed directly to the patient.

RECOMMENDATION:
Screening mammogram in one year. (Code:VZ-A-PDF)

BI-RADS CATEGORY  1:  Negative.

## 2020-12-01 ENCOUNTER — Ambulatory Visit: Payer: Medicare HMO | Admitting: *Deleted

## 2020-12-04 ENCOUNTER — Ambulatory Visit (INDEPENDENT_AMBULATORY_CARE_PROVIDER_SITE_OTHER): Payer: Medicare HMO

## 2020-12-04 ENCOUNTER — Other Ambulatory Visit: Payer: Self-pay

## 2020-12-04 VITALS — BP 142/86 | HR 77 | Temp 98.1°F | Resp 16 | Ht 66.0 in | Wt 194.4 lb

## 2020-12-04 DIAGNOSIS — Z1231 Encounter for screening mammogram for malignant neoplasm of breast: Secondary | ICD-10-CM | POA: Diagnosis not present

## 2020-12-04 DIAGNOSIS — Z122 Encounter for screening for malignant neoplasm of respiratory organs: Secondary | ICD-10-CM | POA: Diagnosis not present

## 2020-12-04 DIAGNOSIS — Z780 Asymptomatic menopausal state: Secondary | ICD-10-CM

## 2020-12-04 DIAGNOSIS — Z0000 Encounter for general adult medical examination without abnormal findings: Secondary | ICD-10-CM | POA: Diagnosis not present

## 2020-12-04 NOTE — Progress Notes (Signed)
Subjective:   LUUL TORELLI is a 75 y.o. female who presents for Medicare Annual (Subsequent) preventive examination.  Review of Systems     Cardiac Risk Factors include: advanced age (>45mn, >>64women);sedentary lifestyle;obesity (BMI >30kg/m2)     Objective:    Today's Vitals   12/04/20 1054  BP: (!) 142/86  Pulse: 77  Resp: 16  Temp: 98.1 F (36.7 C)  TempSrc: Temporal  SpO2: 93%  Weight: 194 lb 6.4 oz (88.2 kg)  Height: '5\' 6"'$  (1.676 m)   Body mass index is 31.38 kg/m.  Advanced Directives 12/04/2020 11/29/2019 11/23/2018 09/19/2017 09/16/2016  Does Patient Have a Medical Advance Directive? Yes Yes Yes Yes Yes  Type of AParamedicof AMintoLiving will HRut[place]Living will H[place]Living will HLovelandLiving will HMorrisLiving will  Does patient want to make changes to medical advance directive? - No - Patient declined No - Patient declined No - Patient declined -  Copy of HGlen Ellynin Chart? No - copy requested No - copy requested No - copy requested No - copy requested No - copy requested    Current Medications (verified) Outpatient Encounter Medications as of 12/04/2020  Medication Sig   albuterol (VENTOLIN HFA) 108 (90 Base) MCG/ACT inhaler Inhale 2 puffs into the lungs every 6 (six) hours as needed for wheezing or shortness of breath. (Patient not taking: Reported on 12/04/2020)   alendronate (FOSAMAX) 70 MG tablet Take 1 tablet (70 mg total) by mouth once a week. NEEDS OV FOR FURTHER REFILLS (Patient not taking: Reported on 12/04/2020)   rosuvastatin (CRESTOR) 20 MG tablet Take 1 tablet (20 mg total) by mouth daily. (Patient not taking: Reported on 12/04/2020)   Vitamin D, Ergocalciferol, (DRISDOL) 1.25 MG (50000 UT) CAPS capsule Take 1 capsule (50,000 Units total) by mouth every 7 (seven) days. (Patient not taking: Reported on 12/04/2020)   No  facility-administered encounter medications on file as of 12/04/2020.    Allergies (verified) Latex and Morphine and related   History: Past Medical History:  Diagnosis Date   [place]    [place]   History of chicken pox    History of shingles    Osteoporosis    Past Surgical History:  Procedure Laterality Date   ABDOMINAL HYSTERECTOMY     AUGMENTATION MAMMAPLASTY Bilateral    Silicone, in front of muscle   BREAST BIOPSY     Patient has had 3 biopsy   BREAST ENHANCEMENT SURGERY  1975   TONSILLECTOMY     Family History  Problem Relation Age of Onset   Cancer Mother        Uterus   Alcohol abuse Father    Heart disease Father    Heart attack Son    Colon polyps Neg Hx    Colon cancer Neg Hx    Esophageal cancer Neg Hx    Rectal cancer Neg Hx    Stomach cancer Neg Hx    Social History   Socioeconomic History   Marital status: Married    Spouse name: Not on file   Number of children: Not on file   Years of education: Not on file   Highest education level: Not on file  Occupational History   Not on file  Tobacco Use   Smoking status: Former    Packs/day: 2.00    Years: 50.00    Pack years: 100.00    Types:  Cigarettes    Quit date: 07/01/2016    Years since quitting: 4.4   Smokeless tobacco: Never  Vaping Use   Vaping Use: Never used  Substance and Sexual Activity   Alcohol use: No   Drug use: No   Sexual activity: Yes    Birth control/protection: Surgical  Other Topics Concern   Not on file  Social History Narrative   Not on file   Social Determinants of Health   Financial Resource Strain: Low Risk    Difficulty of Paying Living Expenses: Not hard at all  Food Insecurity: No Food Insecurity   Worried About Charity fundraiser in the Last Year: Never true   [place] in the Last Year: Never true  Transportation Needs: No Transportation Needs   Lack of Transportation (Medical): No   Lack of Transportation (Non-Medical): No   Physical Activity: Inactive   Days of Exercise per Week: 0 days   Minutes of Exercise per Session: 0 min  Stress: No Stress Concern Present   Feeling of Stress : Not at all  Social Connections: Moderately Integrated   Frequency of Communication with Friends and Family: More than three times a week   Frequency of Social Gatherings with Friends and Family: More than three times a week   Attends Religious Services: More than 4 times per year   Active Member of [employer] or Organizations: No   Attends Music therapist: Never   Marital Status: Married    Tobacco Counseling Counseling given: Not Answered   Clinical Intake:  Pre-visit preparation completed: Yes  Pain : No/denies pain     Nutritional Status: BMI > 30  Obese Nutritional Risks: None Diabetes: No  How often do you need to have someone help you when you read instructions, pamphlets, or other written materials from your doctor or pharmacy?: 1 - Never  Diabetic?No  Interpreter Needed?: No  Information entered by :: Caroleen Hamman LPN   Activities of Daily Living In your present state of health, do you have any difficulty performing the following activities: 12/04/2020  Hearing? N  Vision? N  Difficulty concentrating or making decisions? N  Walking or climbing stairs? N  Dressing or bathing? N  Doing errands, shopping? N  Preparing Food and eating ? N  Using the Toilet? N  In the past six months, have you accidently leaked urine? N  Do you have problems with loss of bowel control? N  Managing your Medications? N  Managing your Finances? N  Housekeeping or managing your Housekeeping? N  Some recent data might be hidden    Patient Care Team: Shelda Pal, DO as PCP - General (Family Medicine)  Indicate any recent Medical Services you may have received from other than [facility] providers in the past year (date may be approximate).     Assessment:   This is a routine wellness examination  for [facility].  Hearing/Vision screen Hearing Screening - Comments:: No issues Vision Screening - Comments:: Reading glasses Last eye exam-5 years ago  Dietary issues and exercise activities discussed: Current Exercise Habits: The patient does not participate in regular exercise at present, Exercise limited by: None identified   Goals Addressed               This Visit's Progress     Patient Stated     Begin exercising at [facility] (pt-stated)   Not on track     Other     Maintain current health  and not start smoking again.   On track     Patient Stated        Drink more water       Depression Screen PHQ 2/9 Scores 12/04/2020 11/29/2019 11/23/2018 09/19/2017 09/16/2016 08/19/2016  PHQ - 2 Score 0 0 0 0 0 0  PHQ- 9 Score - - - - - 1    Fall Risk Fall Risk  12/04/2020 11/29/2019 11/23/2018 09/19/2017 09/16/2016  Falls in the past year? 0 0 0 No No  Number falls in past yr: 0 0 - - -  Injury with Fall? 0 0 - - -  Follow up Falls prevention discussed Education provided;Falls prevention discussed - - -    FALL RISK PREVENTION PERTAINING TO THE HOME:  Any stairs in or around the home? Yes  If so, are there any without handrails? No  Home free of loose throw rugs in walkways, pet beds, electrical cords, etc? Yes  Adequate lighting in your home to reduce risk of falls? Yes   ASSISTIVE DEVICES UTILIZED TO PREVENT FALLS:  Life alert? No  Use of a cane, walker or w/c? No  Grab bars in the bathroom? No  Shower chair or bench in shower? No  Elevated toilet seat or a handicapped toilet? No   TIMED UP AND GO:  Was the test performed? Yes .  Length of time to ambulate 10 feet: 10 sec.   Gait steady and fast without use of assistive device  Cognitive Function:Normal cognitive status assessed by direct observation by this Nurse Health Advisor. No abnormalities found.          Immunizations Immunization History  Administered Date(s) Administered   [employer]  Vaccination 06/13/2019, 07/11/2019   Pneumococcal Conjugate-13 08/19/2016   Pneumococcal Polysaccharide-23 01/08/2019   Td 11/07/2008   Zoster Recombinat (Shingrix) 08/19/2016, 03/03/2017    TDAP status: Due, Education has been provided regarding the importance of this vaccine. Advised may receive this vaccine at local pharmacy or Health Dept. Aware to provide a copy of the vaccination record if obtained from local pharmacy or Health Dept. Verbalized acceptance and understanding.  Flu Vaccine status: Up to date  Pneumococcal vaccine status: Up to date  Covid-19 vaccine status: Information provided on how to obtain vaccines. Booster due  Qualifies for Shingles Vaccine? No   Zostavax completed No   Shingrix Completed?: Yes  Screening Tests Health Maintenance  Topic Date Due   TETANUS/TDAP  11/08/2018   COVID-19 Vaccine (3 - Moderna risk series) 08/08/2019   INFLUENZA VACCINE  12/08/2020   MAMMOGRAM  12/31/2020   DEXA SCAN  Completed   Hepatitis C Screening  Completed   PNA vac Low Risk Adult  Completed   Zoster Vaccines- Shingrix  Completed   HPV VACCINES  Aged Out    Health Maintenance  Health Maintenance Due  Topic Date Due   TETANUS/TDAP  11/08/2018   COVID-19 Vaccine (3 - Moderna risk series) 08/08/2019    Colorectal cancer screening: No longer required.   Mammogram status: Ordered today. Pt provided with contact info and advised to call to schedule appt.   Bone Density status: Ordered today. Pt provided with contact info and advised to call to schedule appt.  Lung Cancer Screening: (Low Dose CT Chest recommended if Age 37-80 years, 30 pack-year currently smoking OR have quit w/in 15years.) does not qualify.   Lung Cancer Screening : Completed 01/09/2020-Ordered today to be completed after 01/08/21  Additional Screening:  Hepatitis C Screening: does qualify;  Discuss with PCP  Vision Screening: Recommended annual ophthalmology exams for early detection of glaucoma  and other disorders of the eye. Is the patient up to date with their annual eye exam?  No  Who is the provider or what is the name of the office in which the patient attends annual eye exams? unknown Patient advised to make an appt  Dental Screening: Recommended annual dental exams for proper oral hygiene  Community Resource Referral / Chronic Care Management: CRR required this visit?  No   CCM required this visit?  No      Plan:     I have personally reviewed and noted the following in the patient's chart:   Medical and social history Use of alcohol, tobacco or illicit drugs  Current medications and supplements including opioid prescriptions.  Functional ability and status Nutritional status Physical activity Advanced directives List of other physicians Hospitalizations, surgeries, and ER visits in previous 12 months Vitals Screenings to include cognitive, depression, and falls Referrals and appointments  In addition, I have reviewed and discussed with patient certain preventive protocols, quality metrics, and best practice recommendations. A written personalized care plan for preventive services as well as general preventive health recommendations were provided to patient.   Patient to access avs on mychart  Marta Antu, [place]   [phone]  Nurse Health Advisor  Nurse Notes: None

## 2020-12-04 NOTE — Patient Instructions (Signed)
Ms. Dorrance , Thank you for taking time to come for your Medicare Wellness Visit. I appreciate your ongoing commitment to your health goals. Please review the following plan we discussed and let me know if I can assist you in the future.   Screening recommendations/referrals: Colonoscopy: No longer required Mammogram: Ordered today. Someone will call to schedule. Bone Density: Ordered today. Someone will call to schedule. Lung Cancer Screening:Ordered today. Someone will call to schedule. Recommended yearly ophthalmology/optometry visit for glaucoma screening and checkup Recommended yearly dental visit for hygiene and checkup  Vaccinations: Influenza vaccine: Due 01/2021 Pneumococcal vaccine: Up to date Tdap vaccine: Discuss with pharmacy Shingles vaccine: Completed vaccines   Covid-19:Booster due  Advanced directives: Please bring a copy for your chart  Conditions/risks identified: See problem list  Next appointment: Follow up in one year for your annual wellness visit 12/10/2021 @ 9:40   Preventive Care 65 Years and Older, Female Preventive care refers to lifestyle choices and visits with your health care provider that can promote health and wellness. What does preventive care include? A yearly physical exam. This is also called an annual well check. Dental exams once or twice a year. Routine eye exams. Ask your health care provider how often you should have your eyes checked. Personal lifestyle choices, including: Daily care of your teeth and gums. Regular physical activity. Eating a healthy diet. Avoiding tobacco and drug use. Limiting alcohol use. Practicing safe sex. Taking low-dose aspirin every day. Taking vitamin and mineral supplements as recommended by your health care provider. What happens during an annual well check? The services and screenings done by your health care provider during your annual well check will depend on your age, overall health, lifestyle risk  factors, and family history of disease. Counseling  Your health care provider may ask you questions about your: Alcohol use. Tobacco use. Drug use. Emotional well-being. Home and relationship well-being. Sexual activity. Eating habits. History of falls. Memory and ability to understand (cognition). Work and work Statistician. Reproductive health. Screening  You may have the following tests or measurements: Height, weight, and BMI. Blood pressure. Lipid and cholesterol levels. These may be checked every 5 years, or more frequently if you are over 57 years old. Skin check. Lung cancer screening. You may have this screening every year starting at age 30 if you have a 30-pack-year history of smoking and currently smoke or have quit within the past 15 years. Fecal occult blood test (FOBT) of the stool. You may have this test every year starting at age 39. Flexible sigmoidoscopy or colonoscopy. You may have a sigmoidoscopy every 5 years or a colonoscopy every 10 years starting at age 74. Hepatitis C blood test. Hepatitis B blood test. Sexually transmitted disease (STD) testing. Diabetes screening. This is done by checking your blood sugar (glucose) after you have not eaten for a while (fasting). You may have this done every 1-3 years. Bone density scan. This is done to screen for osteoporosis. You may have this done starting at age 13. Mammogram. This may be done every 1-2 years. Talk to your health care provider about how often you should have regular mammograms. Talk with your health care provider about your test results, treatment options, and if necessary, the need for more tests. Vaccines  Your health care provider may recommend certain vaccines, such as: Influenza vaccine. This is recommended every year. Tetanus, diphtheria, and acellular pertussis (Tdap, Td) vaccine. You may need a Td booster every 10 years. Zoster vaccine. You may  need this after age 2. Pneumococcal 13-valent  conjugate (PCV13) vaccine. One dose is recommended after age 48. Pneumococcal polysaccharide (PPSV23) vaccine. One dose is recommended after age 55. Talk to your health care provider about which screenings and vaccines you need and how often you need them. This information is not intended to replace advice given to you by your health care provider. Make sure you discuss any questions you have with your health care provider. Document Released: 05/23/2015 Document Revised: 01/14/2016 Document Reviewed: 02/25/2015 Elsevier Interactive Patient Education  2017 [place] Prevention in the Home Falls can cause injuries. They can happen to people of all ages. There are many things you can do to make your home safe and to help prevent falls. What can I do on the outside of my home? Regularly fix the edges of walkways and driveways and fix any cracks. Remove anything that might make you trip as you walk through a door, such as a raised step or threshold. Trim any bushes or trees on the path to your home. Use bright outdoor lighting. Clear any walking paths of anything that might make someone trip, such as rocks or tools. Regularly check to see if handrails are loose or broken. Make sure that both sides of any steps have handrails. Any raised decks and porches should have guardrails on the edges. Have any leaves, snow, or ice cleared regularly. Use sand or salt on walking paths during winter. Clean up any spills in your garage right away. This includes oil or grease spills. What can I do in the bathroom? Use night lights. Install grab bars by the toilet and in the tub and shower. Do not use towel bars as grab bars. Use non-skid mats or decals in the tub or shower. If you need to sit down in the shower, use a plastic, non-slip stool. Keep the floor dry. Clean up any water that spills on the floor as soon as it happens. Remove soap buildup in the tub or shower regularly. Attach bath mats  securely with double-sided non-slip rug tape. Do not have throw rugs and other things on the floor that can make you trip. What can I do in the bedroom? Use night lights. Make sure that you have a light by your bed that is easy to reach. Do not use any sheets or blankets that are too big for your bed. They should not hang down onto the floor. Have a firm chair that has side arms. You can use this for support while you get dressed. Do not have throw rugs and other things on the floor that can make you trip. What can I do in the kitchen? Clean up any spills right away. Avoid walking on wet floors. Keep items that you use a lot in easy-to-reach places. If you need to reach something above you, use a strong step stool that has a grab bar. Keep electrical cords out of the way. Do not use floor polish or wax that makes floors slippery. If you must use wax, use non-skid floor wax. Do not have throw rugs and other things on the floor that can make you trip. What can I do with my stairs? Do not leave any items on the stairs. Make sure that there are handrails on both sides of the stairs and use them. Fix handrails that are broken or loose. Make sure that handrails are as long as the stairways. Check any carpeting to make sure that it is firmly attached  to the stairs. Fix any carpet that is loose or worn. Avoid having throw rugs at the top or bottom of the stairs. If you do have throw rugs, attach them to the floor with carpet tape. Make sure that you have a light switch at the top of the stairs and the bottom of the stairs. If you do not have them, ask someone to add them for you. What else can I do to help prevent falls? Wear shoes that: Do not have high heels. Have rubber bottoms. Are comfortable and fit you well. Are closed at the toe. Do not wear sandals. If you use a stepladder: Make sure that it is fully opened. Do not climb a closed stepladder. Make sure that both sides of the stepladder  are locked into place. Ask someone to hold it for you, if possible. Clearly mark and make sure that you can see: Any grab bars or handrails. First and last steps. Where the edge of each step is. Use tools that help you move around (mobility aids) if they are needed. These include: Canes. Walkers. Scooters. Crutches. Turn on the lights when you go into a dark area. Replace any light bulbs as soon as they burn out. Set up your furniture so you have a clear path. Avoid moving your furniture around. If any of your floors are uneven, fix them. If there are any pets around you, be aware of where they are. Review your medicines with your doctor. Some medicines can make you feel dizzy. This can increase your chance of falling. Ask your doctor what other things that you can do to help prevent falls. This information is not intended to replace advice given to you by your health care provider. Make sure you discuss any questions you have with your health care provider. Document Released: 02/20/2009 Document Revised: 10/02/2015 Document Reviewed: 05/31/2014 Elsevier Interactive Patient Education  2017 [employer].

## 2021-02-03 ENCOUNTER — Encounter (HOSPITAL_BASED_OUTPATIENT_CLINIC_OR_DEPARTMENT_OTHER): Payer: Self-pay

## 2021-02-03 ENCOUNTER — Other Ambulatory Visit: Payer: Self-pay

## 2021-02-03 ENCOUNTER — Ambulatory Visit (HOSPITAL_BASED_OUTPATIENT_CLINIC_OR_DEPARTMENT_OTHER)
Admission: RE | Admit: 2021-02-03 | Discharge: 2021-02-03 | Disposition: A | Discharge status: Home / Self Care | Payer: Medicare HMO | Source: Ambulatory Visit | Attending: Family Medicine | Admitting: Family Medicine

## 2021-02-03 DIAGNOSIS — Z1231 Encounter for screening mammogram for malignant neoplasm of breast: Secondary | ICD-10-CM | POA: Insufficient documentation

## 2021-02-03 DIAGNOSIS — Z87891 Personal history of nicotine dependence: Secondary | ICD-10-CM | POA: Diagnosis not present

## 2021-02-03 DIAGNOSIS — Z780 Asymptomatic menopausal state: Secondary | ICD-10-CM | POA: Insufficient documentation

## 2021-02-03 DIAGNOSIS — Z122 Encounter for screening for malignant neoplasm of respiratory organs: Secondary | ICD-10-CM | POA: Insufficient documentation

## 2021-02-03 DIAGNOSIS — M810 Age-related osteoporosis without current pathological fracture: Secondary | ICD-10-CM | POA: Diagnosis not present

## 2021-02-04 ENCOUNTER — Encounter: Payer: Self-pay | Admitting: Family Medicine

## 2021-03-09 ENCOUNTER — Other Ambulatory Visit: Payer: Self-pay | Admitting: Family Medicine

## 2021-03-09 ENCOUNTER — Other Ambulatory Visit: Payer: Self-pay

## 2021-03-09 ENCOUNTER — Encounter: Payer: Self-pay | Admitting: Family Medicine

## 2021-03-09 ENCOUNTER — Ambulatory Visit (INDEPENDENT_AMBULATORY_CARE_PROVIDER_SITE_OTHER): Payer: Medicare HMO | Admitting: Family Medicine

## 2021-03-09 VITALS — BP 120/80 | HR 81 | Temp 98.1°F | Ht 67.0 in | Wt 195.1 lb

## 2021-03-09 DIAGNOSIS — J439 Emphysema, unspecified: Secondary | ICD-10-CM | POA: Diagnosis not present

## 2021-03-09 DIAGNOSIS — I700 Atherosclerosis of aorta: Secondary | ICD-10-CM | POA: Diagnosis not present

## 2021-03-09 DIAGNOSIS — M810 Age-related osteoporosis without current pathological fracture: Secondary | ICD-10-CM

## 2021-03-09 LAB — LIPID PANEL
Cholesterol: 225 mg/dL — ABNORMAL HIGH (ref 0–200)
HDL: 70 mg/dL (ref >39.00)
LDL Cholesterol: 140 mg/dL — ABNORMAL HIGH (ref 0–99)
NonHDL: 154.56
Total CHOL/HDL Ratio: 3
Triglycerides: 72 mg/dL (ref 0.0–149.0)
VLDL: 14.4 mg/dL (ref 0.0–40.0)

## 2021-03-09 LAB — COMPREHENSIVE METABOLIC PANEL
ALT: 12 U/L (ref 0–35)
AST: 13 U/L (ref 0–37)
Albumin: 4 g/dL (ref 3.5–5.2)
Alkaline Phosphatase: 85 U/L (ref 39–117)
BUN: 19 mg/dL (ref 6–23)
CO2: 34 mEq/L — ABNORMAL HIGH (ref 19–32)
Calcium: 9.1 mg/dL (ref 8.4–10.5)
Chloride: 104 mEq/L (ref 96–112)
Creatinine, Ser: 0.79 mg/dL (ref 0.40–1.20)
GFR: 73.91 mL/min (ref >60.00)
Glucose, Bld: 92 mg/dL (ref 70–99)
Potassium: 5 mEq/L (ref 3.5–5.1)
Sodium: 143 mEq/L (ref 135–145)
Total Bilirubin: 0.5 mg/dL (ref 0.2–1.2)
Total Protein: 6.7 g/dL (ref 6.0–8.3)

## 2021-03-09 LAB — VITAMIN D 25 HYDROXY (VIT D DEFICIENCY, FRACTURES): VITD: 15.5 ng/mL — ABNORMAL LOW (ref 30.00–100.00)

## 2021-03-09 MED ORDER — VITAMIN D (ERGOCALCIFEROL) 1.25 MG (50000 UNIT) PO CAPS: 50000.0000 [IU] | ORAL_CAPSULE | ORAL | 0 refills | Status: DC

## 2021-03-09 MED ORDER — ALBUTEROL SULFATE HFA 108 (90 BASE) MCG/ACT IN AERS: 2.0000 | INHALATION_SPRAY | Freq: Four times a day (QID) | RESPIRATORY_TRACT | 0 refills | Status: DC | PRN

## 2021-03-09 MED ORDER — ROSUVASTATIN CALCIUM 20 MG PO TABS: 20.0000 mg | ORAL_TABLET | Freq: Every day | ORAL | 3 refills | Status: DC

## 2021-03-09 NOTE — Patient Instructions (Signed)
Give [place] 2-3 business days to get the results of your labs back.   Keep the diet clean and stay active.  Take 1200 mg of calcium daily and at least 1000 units of vitamin D3 daily.   Let us know if you need anything.

## 2021-03-09 NOTE — Progress Notes (Signed)
Chief Complaint  Patient presents with   Results    Subjective: Patient is a 75 y.o. female here for f/u.  Osteoporosis-patient had a bone density scan around 1 month ago that showed osteoporosis, progressing from osteopenia.  She has declined all previous prescription medications but has taken prescription strength vitamin D in the past.  She does not take any supplements currently and is not currently doing any weightbearing exercise.  No history of fragility fractures.  Aortic atherosclerosis-proven on imaging, she used to take Crestor 20 mg daily.  Diet is fair, no exercise.  No chest pain or new shortness of breath unrelated to emphysema.  She is willing to take the Crestor daily now.  Emphysema-also proven on imaging.  She does not take any maintenance inhalers.  She has an albuterol inhaler but is unsure when to use it.  She notices physical exertion seems to flare her shortness of breath the most.  No wheezing or cough.  Past Medical History:  Diagnosis Date   Aortic atherosclerosis ([place])    CT 2019   Emphysema of lung ([place])    History of chicken pox    History of shingles    Osteoporosis     Objective: BP 120/80   Pulse 81   Temp 98.1 F (36.7 C) (Oral)   Ht 5\' 7"  (1.702 m)   Wt 195 lb 2 oz (88.5 kg)   SpO2 93%   BMI 30.56 kg/m  General: Awake, appears stated age Heart: RRR, no lower extremity edema Lungs: CTAB, no rales, wheezes or rhonchi. No accessory muscle use Psych: Age appropriate judgment and insight, normal affect and mood  Assessment and Plan: Age-related osteoporosis without current pathological fracture - Plan: VITAMIN D 25 Hydroxy (Vit-D Deficiency, Fractures)  Aortic atherosclerosis (HCC) - Plan: rosuvastatin (CRESTOR) 20 MG tablet, Comprehensive metabolic panel, Lipid panel  Pulmonary emphysema, unspecified emphysema type ([place]) - Plan: albuterol (VENTOLIN HFA) 108 (90 Base) MCG/ACT inhaler  Chronic, not ideally controlled.  Check vitamin D, if  normal will start Fosamax 70 mg weekly.  Counseled on exercise. Chronic, unstable.  Restart Crestor 20 mg daily, check labs today.  Counseled on diet. Use albuterol as needed, particularly before exertion which seems to flare her breathing the most. Follow-up as needed or in 6 months for a physical. The patient voiced understanding and agreement to the plan.  Burns, DO 03/09/21  9:24 AM

## 2021-03-10 ENCOUNTER — Other Ambulatory Visit: Payer: Self-pay | Admitting: Family Medicine

## 2021-03-10 DIAGNOSIS — E785 Hyperlipidemia, unspecified: Secondary | ICD-10-CM

## 2021-03-10 DIAGNOSIS — E559 Vitamin D deficiency, unspecified: Secondary | ICD-10-CM

## 2021-04-17 ENCOUNTER — Telehealth: Payer: Self-pay | Admitting: Family Medicine

## 2021-04-17 NOTE — Telephone Encounter (Signed)
I called the patient and she has a cough and congestion. No fever/or sore throat. Thinks just an old fashion cold. Did inform she would need an appointment to have medication prescribed. She said if she worsens over the weekend would go to the UC. Appreciated the quick call back.

## 2021-04-17 NOTE — Telephone Encounter (Signed)
Pt. Called in and stated that she believes she has a chest cold and she has a bad cough. I offered to get her scheduled for an appointment for next week or suggested a local urgent care due to her requesting antibiotics. She declined and wanted me to get a message to Dr.Wendling/Robin to see if they could talk to her about her symptoms and go from there.

## 2021-04-21 ENCOUNTER — Other Ambulatory Visit: Payer: Medicare HMO

## 2021-04-21 ENCOUNTER — Other Ambulatory Visit (INDEPENDENT_AMBULATORY_CARE_PROVIDER_SITE_OTHER): Payer: Medicare HMO

## 2021-04-21 DIAGNOSIS — E559 Vitamin D deficiency, unspecified: Secondary | ICD-10-CM | POA: Diagnosis not present

## 2021-04-21 DIAGNOSIS — E785 Hyperlipidemia, unspecified: Secondary | ICD-10-CM

## 2021-04-21 LAB — LIPID PANEL
Cholesterol: 133 mg/dL (ref 0–200)
HDL: 56.4 mg/dL (ref >39.00)
LDL Cholesterol: 59 mg/dL (ref 0–99)
NonHDL: 77.06
Total CHOL/HDL Ratio: 2
Triglycerides: 88 mg/dL (ref 0.0–149.0)
VLDL: 17.6 mg/dL (ref 0.0–40.0)

## 2021-04-21 LAB — HEPATIC FUNCTION PANEL
ALT: 19 U/L (ref 0–35)
AST: 18 U/L (ref 0–37)
Albumin: 4.1 g/dL (ref 3.5–5.2)
Alkaline Phosphatase: 94 U/L (ref 39–117)
Bilirubin, Direct: 0.1 mg/dL (ref 0.0–0.3)
Total Bilirubin: 0.7 mg/dL (ref 0.2–1.2)
Total Protein: 7 g/dL (ref 6.0–8.3)

## 2021-04-21 LAB — VITAMIN D 25 HYDROXY (VIT D DEFICIENCY, FRACTURES): VITD: 25.84 ng/mL — ABNORMAL LOW (ref 30.00–100.00)

## 2021-04-25 ENCOUNTER — Other Ambulatory Visit: Payer: Self-pay | Admitting: Family Medicine

## 2021-04-25 DIAGNOSIS — J439 Emphysema, unspecified: Secondary | ICD-10-CM

## 2021-07-12 ENCOUNTER — Other Ambulatory Visit: Payer: Self-pay | Admitting: Family Medicine

## 2021-07-12 DIAGNOSIS — J439 Emphysema, unspecified: Secondary | ICD-10-CM

## 2021-08-07 ENCOUNTER — Other Ambulatory Visit: Payer: Self-pay | Admitting: Family Medicine

## 2021-09-10 DIAGNOSIS — L578 Other skin changes due to chronic exposure to nonionizing radiation: Secondary | ICD-10-CM | POA: Diagnosis not present

## 2021-09-10 DIAGNOSIS — L570 Actinic keratosis: Secondary | ICD-10-CM | POA: Diagnosis not present

## 2021-09-10 DIAGNOSIS — L821 Other seborrheic keratosis: Secondary | ICD-10-CM | POA: Diagnosis not present

## 2021-09-10 DIAGNOSIS — I8310 Varicose veins of unspecified lower extremity with inflammation: Secondary | ICD-10-CM | POA: Diagnosis not present

## 2021-09-14 ENCOUNTER — Other Ambulatory Visit: Payer: Self-pay | Admitting: Family Medicine

## 2021-09-14 DIAGNOSIS — J439 Emphysema, unspecified: Secondary | ICD-10-CM

## 2021-10-08 DIAGNOSIS — I8310 Varicose veins of unspecified lower extremity with inflammation: Secondary | ICD-10-CM | POA: Diagnosis not present

## 2021-10-15 ENCOUNTER — Telehealth: Payer: Self-pay | Admitting: Family Medicine

## 2021-10-15 NOTE — Telephone Encounter (Signed)
Nurse Assessment Nurse: Altamease Oiler, RN, Adriana Date/Time (Eastern Time): 10/15/2021 11:32:59 AM Confirm and document reason for call. If symptomatic, describe symptoms. ---pt states she had a near fall on sunday. unsure if she hit her left side, rib cage. on wed noticed achy feeling to left side of chest. below breast, mostly hurts with movement. Does the patient have any new or worsening symptoms? ---Yes Will a triage be completed? ---Yes Related visit to physician within the last 2 weeks? ---No Does the PT have any chronic conditions? (i.e. diabetes, asthma, this includes High risk factors for pregnancy, etc.) ---Yes List chronic conditions. ---high cholesterol Is this a behavioral health or substance abuse call? ---No Guidelines Guideline Title Affirmed Question Affirmed Notes Nurse Date/Time Eilene Ghazi Time) Chest Pain Taking a deep breath makes pain worse Altamease Oiler, RN, Adriana 10/15/2021 11:37:22 AM Disp. Time Eilene Ghazi Time) Disposition Final User 10/15/2021 11:31:19 AM Send to Urgent Queue Uvaldo Rising 10/15/2021 11:42:56 AM Go to ED Now (or PCP triage) Yes Altamease Oiler, RN, Adriana PLEASE NOTE: All timestamps contained within this report are represented as [place] Standard Time. CONFIDENTIALTY NOTICE: This fax transmission is intended only for the addressee. It contains information that is legally privileged, confidential or otherwise protected from use or disclosure. If you are not the intended recipient, you are strictly prohibited from reviewing, disclosing, copying using or disseminating any of this information or taking any action in reliance on or regarding this information. If you have received this fax in error, please notify us immediately by telephone so that we can arrange for its return to [place]. Phone: [phone], Toll-Free: [phone], Fax: [phone] Page: 2 of 2 Call Id: [id number] Caller Disagree/Comply Disagree Caller Understands Yes PreDisposition Call Doctor Care Advice  Given Per Guideline GO TO ED NOW (OR PCP TRIAGE): CARE ADVICE given per Chest Pain (Adult) guideline. * IF NO PCP (PRIMARY CARE PROVIDER) SECOND-LEVEL TRIAGE: You need to be seen within the next hour. Go to the [facility] at _____________ [place] as soon as you can. Comments User: Kizzie Fantasia, RN Date/Time Eilene Ghazi Time): 10/15/2021 11:47:48 AM called backline, no appts. informed pt. states she will deal with it. i re iterated that ER is not necessary at this time Referrals GO TO FACILITY REFUSED

## 2021-10-15 NOTE — Telephone Encounter (Signed)
Pt called and requested an appt for left side pain. She stated over the weekend she almost fell, but ended up catching herself. Ever since then she has noticed her left side getting increasingly more sore when turning over in bed or taking deep breaths. Scheduled tomorrow and transferred to triage.

## 2021-10-15 NOTE — Telephone Encounter (Signed)
FYI

## 2021-10-15 NOTE — Telephone Encounter (Signed)
Received call from triage nurse- disposition is urgent care- however Pt declined. Pt requesting an order for chest x-ray, after reviewing chart informed triage nurse that Pt is scheduled w/ PCP tomorrow-can discuss need for x-ray at that time or she will have to go to urgent care as we have no visits available at our office today.

## 2021-10-16 ENCOUNTER — Ambulatory Visit (INDEPENDENT_AMBULATORY_CARE_PROVIDER_SITE_OTHER): Payer: No Typology Code available for payment source | Admitting: Family Medicine

## 2021-10-16 ENCOUNTER — Encounter: Payer: Self-pay | Admitting: Family Medicine

## 2021-10-16 VITALS — BP 120/82 | HR 87 | Temp 98.1°F | Ht 67.0 in | Wt 203.4 lb

## 2021-10-16 DIAGNOSIS — J439 Emphysema, unspecified: Secondary | ICD-10-CM | POA: Diagnosis not present

## 2021-10-16 DIAGNOSIS — R109 Unspecified abdominal pain: Secondary | ICD-10-CM | POA: Diagnosis not present

## 2021-10-16 DIAGNOSIS — J302 Other seasonal allergic rhinitis: Secondary | ICD-10-CM | POA: Insufficient documentation

## 2021-10-16 MED ORDER — FLUTICASONE PROPIONATE 50 MCG/ACT NA SUSP: 2.0000 | Freq: Every day | NASAL | 6 refills | Status: DC

## 2021-10-16 MED ORDER — ALBUTEROL SULFATE HFA 108 (90 BASE) MCG/ACT IN AERS: INHALATION_SPRAY | RESPIRATORY_TRACT | 2 refills | Status: DC

## 2021-10-16 MED ORDER — MONTELUKAST SODIUM 10 MG PO TABS: 10.0000 mg | ORAL_TABLET | Freq: Every day | ORAL | 3 refills | Status: DC

## 2021-10-16 NOTE — Patient Instructions (Addendum)
Claritin (loratadine), Allegra (fexofenadine), Zyrtec (cetirizine) which is also equivalent to Xyzal (levocetirizine); these are listed in order from weakest to strongest. Generic, and therefore cheaper, options are in the parentheses.   Flonase (fluticasone); nasal spray that is over the counter. 2 sprays each nostril, once daily. Aim towards the same side eye when you spray.  There are available OTC, and the generic versions, which may be cheaper, are in parentheses. Show this to a pharmacist if you have trouble finding any of these items.  Ice/cold pack over area for 10-15 min twice daily.  OK to take Tylenol 1000 mg (2 extra strength tabs) or 975 mg (3 regular strength tabs) every 6 hours as needed.  The side should steadily resolve.   Let us know if you need anything.

## 2021-10-16 NOTE — Progress Notes (Signed)
Musculoskeletal Exam  Patient: Kelly Henderson DOB: 25-Nov-1945  DOS: 10/16/2021  SUBJECTIVE:  Chief Complaint:   Chief Complaint  Patient presents with   Flank Pain    Left side     Kelly Henderson is a 76 y.o.  female for evaluation and treatment of flank pain.   Onset:  3 days ago. Nearly tripped over a grate and jolted back Location: L mid/lower back Character:  aching and sharp  Progression of issue:  is unchanged Associated symptoms: Coughing, turning in bed makes things worse No bruising, redness, swelling, fevers, urinary complaints Treatment: to date has been OTC NSAIDS.   Neurovascular symptoms: no  Past Medical History:  Diagnosis Date   Aortic atherosclerosis ([place])    CT 2019   Emphysema of lung ([place])    History of chicken pox    History of shingles    Osteoporosis     Objective: VITAL SIGNS: BP 120/82   Pulse 87   Temp 98.1 F (36.7 C) (Oral)   Ht '5\' 7"'$  (1.702 m)   Wt 203 lb 6 oz (92.3 kg)   SpO2 93%   BMI 31.85 kg/m  Constitutional: Well formed, well developed. No acute distress. Heart: RRR Thorax & Lungs: Decreased breath sounds, clear to auscultation bilaterally.  No accessory muscle use Musculoskeletal: side.   Tenderness to palpation: Mild TTP over the left lower rib cage laterally Deformity: no Crepitus: No Neurologic: Normal sensory function.  Psychiatric: Normal mood. Age appropriate judgment and insight. Alert & oriented x 3.    Assessment:  Side pain  Seasonal allergies - Plan: montelukast (SINGULAIR) 10 MG tablet, fluticasone (FLONASE) 50 MCG/ACT nasal spray  Pulmonary emphysema, unspecified emphysema type ([place]) - Plan: albuterol (VENTOLIN HFA) 108 (90 Base) MCG/ACT inhaler  Plan: Reassurance given that this is not likely a splenic laceration.  Stretch area daily, heat, ice, Tylenol.  Chronic, uncontrolled.  Patient thinks that allergies are triggering her emphysema.  She would like to try Singulair specifically as a  coworker did very well with this.  I recommended Flonase as a first-line medication but we will also send in Singulair 10 mg daily. F/u 6 weeks to recheck this and have a physical. Chronic, uncontrolled.  Treatment as above.  She does not want start a daily inhaler, in this case a LAMA. The patient voiced understanding and agreement to the plan.   [place], DO 10/16/21  12:21 PM

## 2021-11-06 ENCOUNTER — Other Ambulatory Visit: Payer: Self-pay | Admitting: Family Medicine

## 2021-12-07 ENCOUNTER — Encounter: Payer: No Typology Code available for payment source | Admitting: Family Medicine

## 2021-12-10 ENCOUNTER — Ambulatory Visit (INDEPENDENT_AMBULATORY_CARE_PROVIDER_SITE_OTHER): Payer: No Typology Code available for payment source

## 2021-12-10 VITALS — BP 122/78 | HR 84 | Temp 97.8°F | Resp 16 | Ht 67.0 in | Wt 213.0 lb

## 2021-12-10 DIAGNOSIS — Z0000 Encounter for general adult medical examination without abnormal findings: Secondary | ICD-10-CM | POA: Diagnosis not present

## 2021-12-10 NOTE — Patient Instructions (Addendum)
Kelly Henderson , Thank you for taking time to come for your Medicare Wellness Visit. I appreciate your ongoing commitment to your health goals. Please review the following plan we discussed and let me know if I can assist you in the future.   Screening recommendations/referrals: Colonoscopy: 12/14/18 due 12/13/28 Mammogram: 02/03/21 due 02/03/22 Bone Density: 02/03/21 due 02/04/23 Recommended yearly ophthalmology/optometry visit for glaucoma screening and checkup Recommended yearly dental visit for hygiene and checkup  Vaccinations: Influenza vaccine: declined Pneumococcal vaccine: up to date Tdap vaccine: declined Shingles vaccine: up to date   Covid-19:declined  Advanced directives: yes, not on file  Conditions/risks identified: see problem list   Next appointment: Follow up in one year for your annual wellness visit    Preventive Care 75 Years and Older, Female Preventive care refers to lifestyle choices and visits with your health care provider that can promote health and wellness. What does preventive care include? A yearly physical exam. This is also called an annual well check. Dental exams once or twice a year. Routine eye exams. Ask your health care provider how often you should have your eyes checked. Personal lifestyle choices, including: Daily care of your teeth and gums. Regular physical activity. Eating a healthy diet. Avoiding tobacco and drug use. Limiting alcohol use. Practicing safe sex. Taking low-dose aspirin every day. Taking vitamin and mineral supplements as recommended by your health care provider. What happens during an annual well check? The services and screenings done by your health care provider during your annual well check will depend on your age, overall health, lifestyle risk factors, and family history of disease. Counseling  Your health care provider may ask you questions about your: Alcohol use. Tobacco use. Drug use. Emotional well-being. Home  and relationship well-being. Sexual activity. Eating habits. History of falls. Memory and ability to understand (cognition). Work and work Statistician. Reproductive health. Screening  You may have the following tests or measurements: Height, weight, and BMI. Blood pressure. Lipid and cholesterol levels. These may be checked every 5 years, or more frequently if you are over 49 years old. Skin check. Lung cancer screening. You may have this screening every year starting at age 75 if you have a 30-pack-year history of smoking and currently smoke or have quit within the past 15 years. Fecal occult blood test (FOBT) of the stool. You may have this test every year starting at age 75. Flexible sigmoidoscopy or colonoscopy. You may have a sigmoidoscopy every 5 years or a colonoscopy every 10 years starting at age 25. Hepatitis C blood test. Hepatitis B blood test. Sexually transmitted disease (STD) testing. Diabetes screening. This is done by checking your blood sugar (glucose) after you have not eaten for a while (fasting). You may have this done every 1-3 years. Bone density scan. This is done to screen for osteoporosis. You may have this done starting at age 75. Mammogram. This may be done every 75 years. Talk to your health care provider about how often you should have regular mammograms. Talk with your health care provider about your test results, treatment options, and if necessary, the need for more tests. Vaccines  Your health care provider may recommend certain vaccines, such as: Influenza vaccine. This is recommended every year. Tetanus, diphtheria, and acellular pertussis (Tdap, Td) vaccine. You may need a Td booster every 10 years. Zoster vaccine. You may need this after age 75. Pneumococcal 13-valent conjugate (PCV13) vaccine. One dose is recommended after age 75. Pneumococcal polysaccharide (PPSV23) vaccine. One dose is  recommended after age 75. Talk to your health care provider  about which screenings and vaccines you need and how often you need them. This information is not intended to replace advice given to you by your health care provider. Make sure you discuss any questions you have with your health care provider. Document Released: 05/23/2015 Document Revised: 01/14/2016 Document Reviewed: 02/25/2015 Elsevier Interactive Patient Education  2017 [place] Prevention in the Home Falls can cause injuries. They can happen to people of all ages. There are many things you can do to make your home safe and to help prevent falls. What can I do on the outside of my home? Regularly fix the edges of walkways and driveways and fix any cracks. Remove anything that might make you trip as you walk through a door, such as a raised step or threshold. Trim any bushes or trees on the path to your home. Use bright outdoor lighting. Clear any walking paths of anything that might make someone trip, such as rocks or tools. Regularly check to see if handrails are loose or broken. Make sure that both sides of any steps have handrails. Any raised decks and porches should have guardrails on the edges. Have any leaves, snow, or ice cleared regularly. Use sand or salt on walking paths during winter. Clean up any spills in your garage right away. This includes oil or grease spills. What can I do in the bathroom? Use night lights. Install grab bars by the toilet and in the tub and shower. Do not use towel bars as grab bars. Use non-skid mats or decals in the tub or shower. If you need to sit down in the shower, use a plastic, non-slip stool. Keep the floor dry. Clean up any water that spills on the floor as soon as it happens. Remove soap buildup in the tub or shower regularly. Attach bath mats securely with double-sided non-slip rug tape. Do not have throw rugs and other things on the floor that can make you trip. What can I do in the bedroom? Use night lights. Make sure  that you have a light by your bed that is easy to reach. Do not use any sheets or blankets that are too big for your bed. They should not hang down onto the floor. Have a firm chair that has side arms. You can use this for support while you get dressed. Do not have throw rugs and other things on the floor that can make you trip. What can I do in the kitchen? Clean up any spills right away. Avoid walking on wet floors. Keep items that you use a lot in easy-to-reach places. If you need to reach something above you, use a strong step stool that has a grab bar. Keep electrical cords out of the way. Do not use floor polish or wax that makes floors slippery. If you must use wax, use non-skid floor wax. Do not have throw rugs and other things on the floor that can make you trip. What can I do with my stairs? Do not leave any items on the stairs. Make sure that there are handrails on both sides of the stairs and use them. Fix handrails that are broken or loose. Make sure that handrails are as long as the stairways. Check any carpeting to make sure that it is firmly attached to the stairs. Fix any carpet that is loose or worn. Avoid having throw rugs at the top or bottom of the stairs. If  you do have throw rugs, attach them to the floor with carpet tape. Make sure that you have a light switch at the top of the stairs and the bottom of the stairs. If you do not have them, ask someone to add them for you. What else can I do to help prevent falls? Wear shoes that: Do not have high heels. Have rubber bottoms. Are comfortable and fit you well. Are closed at the toe. Do not wear sandals. If you use a stepladder: Make sure that it is fully opened. Do not climb a closed stepladder. Make sure that both sides of the stepladder are locked into place. Ask someone to hold it for you, if possible. Clearly mark and make sure that you can see: Any grab bars or handrails. First and last steps. Where the edge of  each step is. Use tools that help you move around (mobility aids) if they are needed. These include: Canes. Walkers. Scooters. Crutches. Turn on the lights when you go into a dark area. Replace any light bulbs as soon as they burn out. Set up your furniture so you have a clear path. Avoid moving your furniture around. If any of your floors are uneven, fix them. If there are any pets around you, be aware of where they are. Review your medicines with your doctor. Some medicines can make you feel dizzy. This can increase your chance of falling. Ask your doctor what other things that you can do to help prevent falls. This information is not intended to replace advice given to you by your health care provider. Make sure you discuss any questions you have with your health care provider. Document Released: 02/20/2009 Document Revised: 10/02/2015 Document Reviewed: 05/31/2014 Elsevier Interactive Patient Education  2017 [employer].

## 2021-12-10 NOTE — Progress Notes (Signed)
Subjective:   Kelly Henderson is a 76 y.o. female who presents for Medicare Annual (Subsequent) preventive examination.  Review of Systems     Cardiac Risk Factors include: advanced age (>57mn, >>5women);obesity (BMI >30kg/m2)     Objective:    Today's Vitals   12/10/21 0932  BP: 122/78  Pulse: 84  Resp: 16  Temp: 97.8 F (36.6 C)  SpO2: 93%  Weight: 213 lb (96.6 kg)  Height: '5\' 7"'$  (1.702 m)   Body mass index is 33.36 kg/m.     12/10/2021    9:27 AM 12/04/2020   11:11 AM 11/29/2019   11:13 AM 11/23/2018   11:10 AM 09/19/2017   10:34 AM 09/16/2016   10:18 AM  Advanced Directives  Does Patient Have a Medical Advance Directive? Yes Yes Yes Yes Yes Yes  Type of AParamedicof AMead ValleyLiving will H[place]Living will H[place]Living will H[place]Living will [place]Living will HPotterLiving will  Does patient want to make changes to medical advance directive? No - Patient declined  No - Patient declined No - Patient declined No - Patient declined   Copy of [place] Chart? No - copy requested No - copy requested No - copy requested No - copy requested No - copy requested No - copy requested    Current Medications (verified) Outpatient Encounter Medications as of 12/10/2021  Medication Sig   albuterol (VENTOLIN HFA) 108 (90 Base) MCG/ACT inhaler TAKE 2 PUFFS BY MOUTH EVERY 6 HOURS AS NEEDED FOR WHEEZE OR SHORTNESS OF BREATH   fluticasone (FLONASE) 50 MCG/ACT nasal spray Place 2 sprays into both nostrils daily.   montelukast (SINGULAIR) 10 MG tablet Take 1 tablet (10 mg total) by mouth at bedtime.   rosuvastatin (CRESTOR) 20 MG tablet Take 1 tablet (20 mg total) by mouth daily.   Vitamin D, Ergocalciferol, (DRISDOL) 1.25 MG (50000 UNIT) CAPS capsule TAKE 1 CAPSULE (50,000 UNITS TOTAL) BY MOUTH EVERY 7 (SEVEN) DAYS   No  facility-administered encounter medications on file as of 12/10/2021.    Allergies (verified) Latex and Morphine and related   History: Past Medical History:  Diagnosis Date   Aortic atherosclerosis ([place]    CT 2019   Emphysema of lung ([place]    History of chicken pox    History of shingles    Osteoporosis    Past Surgical History:  Procedure Laterality Date   ABDOMINAL HYSTERECTOMY     AUGMENTATION MAMMAPLASTY Bilateral    Silicone, in front of muscle   BREAST BIOPSY     Patient has had 3 biopsy   BREAST ENHANCEMENT SURGERY  1975   TONSILLECTOMY     Family History  Problem Relation Age of Onset   Cancer Mother        Uterus   Alcohol abuse Father    Heart disease Father    Heart attack Son    Colon polyps Neg Hx    Colon cancer Neg Hx    Esophageal cancer Neg Hx    Rectal cancer Neg Hx    Stomach cancer Neg Hx    Social History   Socioeconomic History   Marital status: Married    Spouse name: Not on file   Number of children: Not on file   Years of education: Not on file   Highest education level: Not on file  Occupational History   Not on file  Tobacco Use   Smoking status: Former    Packs/day: 2.00    Years: 50.00    Total pack years: 100.00    Types: Cigarettes    Quit date: 07/01/2016    Years since quitting: 5.4   Smokeless tobacco: Never  Vaping Use   Vaping Use: Never used  Substance and Sexual Activity   Alcohol use: No   Drug use: No   Sexual activity: Yes    Birth control/protection: Surgical  Other Topics Concern   Not on file  Social History Narrative   Not on file   Social Determinants of Health   Financial Resource Strain: Low Risk  (12/04/2020)   Overall Financial Resource Strain (CARDIA)    Difficulty of Paying Living Expenses: Not hard at all  Food Insecurity: No Food Insecurity (12/04/2020)   Hunger Vital Sign    Worried About Running Out of Food in the Last Year: Never true    Ran Out of Food in the Last Year: Never true   Transportation Needs: No Transportation Needs (12/04/2020)   PRAPARE - Hydrologist (Medical): No    Lack of Transportation (Non-Medical): No  Physical Activity: Inactive (12/04/2020)   Exercise Vital Sign    Days of Exercise per Week: 0 days    Minutes of Exercise per Session: 0 min  Stress: No Stress Concern Present (12/04/2020)   [place]    Feeling of Stress : Not at all  Social Connections: Moderately Integrated (12/04/2020)   Social Connection and Isolation Panel [NHANES]    Frequency of Communication with Friends and Family: More than three times a week    Frequency of Social Gatherings with Friends and Family: More than three times a week    Attends Religious Services: More than 4 times per year    Active Member of [employer] or Organizations: No    Attends Music therapist: Never    Marital Status: Married    Tobacco Counseling Counseling given: Not Answered   Clinical Intake:  Pre-visit preparation completed: Yes  Pain : No/denies pain     BMI - recorded: 33.36 Nutritional Status: BMI > 30  Obese Nutritional Risks: None Diabetes: No  How often do you need to have someone help you when you read instructions, pamphlets, or other written materials from your doctor or pharmacy?: 1 - Never  Diabetic?no  Interpreter Needed?: No  Information entered by :: Abraham Margulies   Activities of Daily Living    12/10/2021    9:34 AM  In your present state of health, do you have any difficulty performing the following activities:  Hearing? 0  Vision? 0  Difficulty concentrating or making decisions? 0  Walking or climbing stairs? 0  Dressing or bathing? 0  Doing errands, shopping? 0  Preparing Food and eating ? N  Using the Toilet? N  In the past six months, have you accidently leaked urine? Y  Do you have problems with loss of bowel control? N  Managing your  Medications? N  Managing your Finances? N  Housekeeping or managing your Housekeeping? N    Patient Care Team: Shelda Pal, DO as PCP - General (Family Medicine)  Indicate any recent Medical Services you may have received from other than [facility] providers in the past year (date may be approximate).     Assessment:   This is a routine wellness examination for [facility].  Hearing/Vision screen No  results found.  Dietary issues and exercise activities discussed: Current Exercise Habits: The patient does not participate in regular exercise at present, Exercise limited by: respiratory conditions(s)   Goals Addressed               This Visit's Progress     Begin exercising at [facility] (pt-stated)   Not on track     Maintain current health and not start smoking again.   On track     Patient Stated   On track     Drink more water       Depression Screen    12/10/2021    9:29 AM 12/04/2020   11:14 AM 11/29/2019   11:17 AM 11/23/2018   11:11 AM 09/19/2017   10:35 AM 09/16/2016   10:19 AM 08/19/2016    9:41 AM  PHQ 2/9 Scores  PHQ - 2 Score 0 0 0 0 0 0 0  PHQ- 9 Score       1    Fall Risk    12/10/2021    9:29 AM 12/04/2020   11:13 AM 11/29/2019   11:17 AM 11/23/2018   11:11 AM 09/19/2017   10:35 AM  Fall Risk   Falls in the past year? 0 0 0 0 No  Number falls in past yr: 0 0 0    Injury with Fall? 0 0 0    Risk for fall due to : No Fall Risks      Follow up Falls evaluation completed Falls prevention discussed Education provided;Falls prevention discussed      FALL RISK PREVENTION PERTAINING TO THE HOME:  Any stairs in or around the home? Yes  If so, are there any without handrails? No  Home free of loose throw rugs in walkways, pet beds, electrical cords, etc? Yes  Adequate lighting in your home to reduce risk of falls? Yes   ASSISTIVE DEVICES UTILIZED TO PREVENT FALLS:  Life alert? No  Use of a cane, walker or w/c? No  Grab bars in the bathroom? No  Shower  chair or bench in shower? No  Elevated toilet seat or a handicapped toilet? No   TIMED UP AND GO:  Was the test performed? Yes .  Length of time to ambulate 10 feet: 10 sec.   Gait steady and fast without use of assistive device  Cognitive Function:        12/10/2021    9:36 AM  6CIT Screen  What Year? 0 points  What month? 0 points  What time? 0 points  Count back from 20 0 points  Months in reverse 0 points  Repeat phrase 0 points  Total Score 0 points    Immunizations Immunization History  Administered Date(s) Administered   Moderna Sars-Covid-2 Vaccination 06/13/2019, 07/11/2019   Pneumococcal Conjugate-13 08/19/2016   Pneumococcal Polysaccharide-23 01/08/2019   Td 11/07/2008   Zoster Recombinat (Shingrix) 08/19/2016, 03/03/2017    TDAP status: Due, Education has been provided regarding the importance of this vaccine. Advised may receive this vaccine at local pharmacy or Health Dept. Aware to provide a copy of the vaccination record if obtained from local pharmacy or Health Dept. Verbalized acceptance and understanding.  Flu Vaccine status: Due, Education has been provided regarding the importance of this vaccine. Advised may receive this vaccine at local pharmacy or Health Dept. Aware to provide a copy of the vaccination record if obtained from local pharmacy or Health Dept. Verbalized acceptance and understanding.  Pneumococcal vaccine status: Up to date  Covid-19 vaccine status: Declined, Education has been provided regarding the importance of this vaccine but patient still declined. Advised may receive this vaccine at local pharmacy or Health Dept.or vaccine clinic. Aware to provide a copy of the vaccination record if obtained from local pharmacy or Health Dept. Verbalized acceptance and understanding.  Qualifies for Shingles Vaccine? Yes   Zostavax completed No   Shingrix Completed?: Yes  Screening Tests Health Maintenance  Topic Date Due   TETANUS/TDAP   11/08/2018   COVID-19 Vaccine (3 - Moderna series) 09/05/2019   INFLUENZA VACCINE  12/08/2021   MAMMOGRAM  02/04/2023   Pneumonia Vaccine 92+ Years old  Completed   DEXA SCAN  Completed   Hepatitis C Screening  Completed   Zoster Vaccines- Shingrix  Completed   HPV VACCINES  Aged Out   COLONOSCOPY (Pts 45-28yr Insurance coverage will need to be confirmed)  DManilaMaintenance Due  Topic Date Due   TETANUS/TDAP  11/08/2018   COVID-19 Vaccine (3 - Moderna series) 09/05/2019   INFLUENZA VACCINE  12/08/2021    Colorectal cancer screening: Type of screening: Colonoscopy. Completed 12/14/18. Repeat every 10 years  Mammogram status: Completed 02/03/21. Repeat every year  Bone Density status: Completed 02/03/21. Results reflect: Bone density results: OSTEOPOROSIS. Repeat every 2 years.  Lung Cancer Screening: (Low Dose CT Chest recommended if Age 649-80years, 30 pack-year currently smoking OR have quit w/in 15years.) does qualify.   Lung Cancer Screening Referral: last done 02/03/21  Additional Screening:  Hepatitis C Screening: does qualify; Completed 05/10/1978  Vision Screening: Recommended annual ophthalmology exams for early detection of glaucoma and other disorders of the eye. Is the patient up to date with their annual eye exam?  No  Who is the provider or what is the name of the office in which the patient attends annual eye exams? N/a If pt is not established with a provider, would they like to be referred to a provider to establish care? No .   Dental Screening: Recommended annual dental exams for proper oral hygiene  Community Resource Referral / Chronic Care Management: CRR required this visit?  No   CCM required this visit?  No      Plan:     I have personally reviewed and noted the following in the patient's chart:   Medical and social history Use of alcohol, tobacco or illicit drugs  Current medications and supplements  including opioid prescriptions.  Functional ability and status Nutritional status Physical activity Advanced directives List of other physicians Hospitalizations, surgeries, and ER visits in previous 12 months Vitals Screenings to include cognitive, depression, and falls Referrals and appointments  In addition, I have reviewed and discussed with patient certain preventive protocols, quality metrics, and best practice recommendations. A written personalized care plan for preventive services as well as general preventive health recommendations were provided to patient.     SDuard BradyChism, [place]  12/10/2021   Nurse Notes:

## 2021-12-28 ENCOUNTER — Ambulatory Visit (INDEPENDENT_AMBULATORY_CARE_PROVIDER_SITE_OTHER): Payer: No Typology Code available for payment source | Admitting: Family Medicine

## 2021-12-28 ENCOUNTER — Encounter: Payer: Self-pay | Admitting: Family Medicine

## 2021-12-28 VITALS — BP 136/82 | HR 80 | Temp 97.8°F | Ht 67.0 in | Wt 217.2 lb

## 2021-12-28 DIAGNOSIS — Z0000 Encounter for general adult medical examination without abnormal findings: Secondary | ICD-10-CM

## 2021-12-28 DIAGNOSIS — J439 Emphysema, unspecified: Secondary | ICD-10-CM

## 2021-12-28 DIAGNOSIS — I700 Atherosclerosis of aorta: Secondary | ICD-10-CM | POA: Diagnosis not present

## 2021-12-28 DIAGNOSIS — M810 Age-related osteoporosis without current pathological fracture: Secondary | ICD-10-CM

## 2021-12-28 LAB — LIPID PANEL
Cholesterol: 157 mg/dL (ref 0–200)
HDL: 75.6 mg/dL (ref >39.00)
LDL Cholesterol: 67 mg/dL (ref 0–99)
NonHDL: 81.29
Total CHOL/HDL Ratio: 2
Triglycerides: 72 mg/dL (ref 0.0–149.0)
VLDL: 14.4 mg/dL (ref 0.0–40.0)

## 2021-12-28 LAB — COMPREHENSIVE METABOLIC PANEL
ALT: 19 U/L (ref 0–35)
AST: 20 U/L (ref 0–37)
Albumin: 3.9 g/dL (ref 3.5–5.2)
Alkaline Phosphatase: 92 U/L (ref 39–117)
BUN: 18 mg/dL (ref 6–23)
CO2: 32 mEq/L (ref 19–32)
Calcium: 9 mg/dL (ref 8.4–10.5)
Chloride: 105 mEq/L (ref 96–112)
Creatinine, Ser: 0.72 mg/dL (ref 0.40–1.20)
GFR: 82.14 mL/min (ref >60.00)
Glucose, Bld: 102 mg/dL — ABNORMAL HIGH (ref 70–99)
Potassium: 4.7 mEq/L (ref 3.5–5.1)
Sodium: 146 mEq/L — ABNORMAL HIGH (ref 135–145)
Total Bilirubin: 0.6 mg/dL (ref 0.2–1.2)
Total Protein: 6.6 g/dL (ref 6.0–8.3)

## 2021-12-28 LAB — VITAMIN D 25 HYDROXY (VIT D DEFICIENCY, FRACTURES): VITD: 34.87 ng/mL (ref 30.00–100.00)

## 2021-12-28 MED ORDER — UMECLIDINIUM BROMIDE 62.5 MCG/ACT IN AEPB: 1.0000 | INHALATION_SPRAY | Freq: Every day | RESPIRATORY_TRACT | 2 refills | Status: DC

## 2021-12-28 NOTE — Patient Instructions (Addendum)
Give [place] 2-3 business days to get the results of your labs back.   Keep the diet clean and stay active.  I recommend getting the flu shot in mid October. This suggestion would change if the CDC comes out with a different recommendation.   Please get me a copy of your advanced directive form at your convenience.   Please contact your pharmacy regarding getting a tetanus booster.  Let me know if there are cost issues with the inhaler.   Let us know if you need anything.

## 2021-12-28 NOTE — Progress Notes (Signed)
Chief Complaint  Patient presents with   Annual Exam    Discuss SOB     Well Woman Kelly Henderson is here for a complete physical.   Her last physical was >1 year ago.  Current diet: in general, diet could be better Current exercise: very little. Weight is increased and she denies daytime fatigue. Seatbelt? Yes Advanced directive? No  Health Maintenance Colonoscopy- Yes Shingrix- Yes Lung cancer screening- Yes DEXA- Yes Mammogram- Yes Tetanus- Due Pneumonia- Yes Hep C screen- Yes  Emphysema Using albuterol several times per week. Exertional SOB that lasts for 20 deep breaths and then resolves. She is not having any chest pain. She is a former smoker having a 100-pack-year history and has known emphysema. She is not on a daily inhaler.   Past Medical History:  Diagnosis Date   Aortic atherosclerosis ([place])    CT 2019   Emphysema of lung ([place])    History of chicken pox    History of shingles    Osteoporosis      Past Surgical History:  Procedure Laterality Date   ABDOMINAL HYSTERECTOMY     AUGMENTATION MAMMAPLASTY Bilateral    Silicone, in front of muscle   BREAST BIOPSY     Patient has had 3 biopsy   BREAST ENHANCEMENT SURGERY  1975   TONSILLECTOMY      Medications  Current Outpatient Medications on File Prior to Visit  Medication Sig Dispense Refill   albuterol (VENTOLIN HFA) 108 (90 Base) MCG/ACT inhaler TAKE 2 PUFFS BY MOUTH EVERY 6 HOURS AS NEEDED FOR WHEEZE OR SHORTNESS OF BREATH 18 g 2   fluticasone (FLONASE) 50 MCG/ACT nasal spray Place 2 sprays into both nostrils daily. 16 g 6   montelukast (SINGULAIR) 10 MG tablet Take 1 tablet (10 mg total) by mouth at bedtime. 30 tablet 3   rosuvastatin (CRESTOR) 20 MG tablet Take 1 tablet (20 mg total) by mouth daily. 90 tablet 3   Vitamin D, Ergocalciferol, (DRISDOL) 1.25 MG (50000 UNIT) CAPS capsule TAKE 1 CAPSULE (50,000 UNITS TOTAL) BY MOUTH EVERY 7 (SEVEN) DAYS 12 capsule 0   Allergies Allergies  Allergen  Reactions   Latex Hives   Morphine And Related Swelling    Review of Systems: Constitutional:  no fevers Eye:  no recent significant change in vision Ears:  No changes in hearing Nose/Mouth/Throat:  no complaints of nasal congestion, no sore throat Cardiovascular: no chest pain Respiratory: + shortness of breath Gastrointestinal:  No change in bowel habits GU:  Female: negative for dysuria Integumentary:  no abnormal skin lesions reported Neurologic:  no headaches Endocrine:  denies unexplained weight changes  Exam BP 136/82   Pulse 80   Temp 97.8 F (36.6 C) (Oral)   Ht '5\' 7"'$  (1.702 m)   Wt 217 lb 4 oz (98.5 kg)   SpO2 97%   BMI 34.03 kg/m  General:  well developed, well nourished, in no apparent distress Skin:  no significant moles, warts, or growths Head:  no masses, lesions, or tenderness Eyes:  pupils equal and round, sclera anicteric without injection Ears:  canals without lesions, TMs shiny without retraction, no obvious effusion, no erythema Nose:  nares patent, mucosa normal, and no drainage or sinus tenderness Throat/Pharynx:  lips and gingiva without lesion; tongue and uvula midline; non-inflamed pharynx; no exudates or postnasal drainage Neck: neck supple without adenopathy, thyromegaly, or masses Lungs:  clear to auscultation, breath sounds equal bilaterally, no respiratory distress Cardio:  regular rate and rhythm,  no bruits or LE edema Abdomen:  abdomen soft, nontender; bowel sounds normal; no masses or organomegaly Genital: Deferred Neuro:  gait normal; deep tendon reflexes normal and symmetric Psych: well oriented with normal range of affect and appropriate judgment/insight  Assessment and Plan  Well adult exam  Aortic atherosclerosis ([place]) - Plan: Comprehensive metabolic panel, Lipid panel  Age-related osteoporosis without current pathological fracture - Plan: VITAMIN D 25 Hydroxy (Vit-D Deficiency, Fractures)  Pulmonary emphysema, unspecified  emphysema type ([place])   Well 76 y.o. female. Counseled on diet and exercise. Advanced directive form requested today.  Tdap rec'd. Contact pharmacy for this.  Other orders as above. Emphysema: Chronic, uncontrolled.  Start Incruse daily.  Continue albuterol as needed.  Follow-up in 1 month recheck this. The patient voiced understanding and agreement to the plan.  [place], DO 12/28/21 12:11 PM

## 2021-12-29 ENCOUNTER — Other Ambulatory Visit: Payer: Self-pay | Admitting: Family Medicine

## 2021-12-29 DIAGNOSIS — E870 Hyperosmolality and hypernatremia: Secondary | ICD-10-CM

## 2022-01-04 ENCOUNTER — Other Ambulatory Visit: Payer: No Typology Code available for payment source

## 2022-01-06 ENCOUNTER — Other Ambulatory Visit (INDEPENDENT_AMBULATORY_CARE_PROVIDER_SITE_OTHER): Payer: No Typology Code available for payment source

## 2022-01-06 DIAGNOSIS — E870 Hyperosmolality and hypernatremia: Secondary | ICD-10-CM

## 2022-01-06 LAB — BASIC METABOLIC PANEL
BUN: 21 mg/dL (ref 6–23)
CO2: 32 mEq/L (ref 19–32)
Calcium: 9.8 mg/dL (ref 8.4–10.5)
Chloride: 104 mEq/L (ref 96–112)
Creatinine, Ser: 0.79 mg/dL (ref 0.40–1.20)
GFR: 73.48 mL/min (ref >60.00)
Glucose, Bld: 104 mg/dL — ABNORMAL HIGH (ref 70–99)
Potassium: 5.1 mEq/L (ref 3.5–5.1)
Sodium: 143 mEq/L (ref 135–145)

## 2022-01-08 ENCOUNTER — Other Ambulatory Visit: Payer: Self-pay | Admitting: Family Medicine

## 2022-01-08 DIAGNOSIS — F17200 Nicotine dependence, unspecified, uncomplicated: Secondary | ICD-10-CM

## 2022-01-08 DIAGNOSIS — J439 Emphysema, unspecified: Secondary | ICD-10-CM

## 2022-01-08 DIAGNOSIS — Z122 Encounter for screening for malignant neoplasm of respiratory organs: Secondary | ICD-10-CM

## 2022-01-08 DIAGNOSIS — I700 Atherosclerosis of aorta: Secondary | ICD-10-CM

## 2022-01-15 ENCOUNTER — Encounter: Payer: Self-pay | Admitting: Family Medicine

## 2022-01-18 ENCOUNTER — Other Ambulatory Visit: Payer: Self-pay | Admitting: Family Medicine

## 2022-01-18 DIAGNOSIS — Z1231 Encounter for screening mammogram for malignant neoplasm of breast: Secondary | ICD-10-CM

## 2022-01-20 ENCOUNTER — Telehealth (HOSPITAL_BASED_OUTPATIENT_CLINIC_OR_DEPARTMENT_OTHER): Payer: Self-pay

## 2022-01-29 ENCOUNTER — Encounter: Payer: Self-pay | Admitting: Family Medicine

## 2022-01-29 ENCOUNTER — Ambulatory Visit (INDEPENDENT_AMBULATORY_CARE_PROVIDER_SITE_OTHER): Payer: No Typology Code available for payment source | Admitting: Family Medicine

## 2022-01-29 VITALS — BP 122/80 | HR 78 | Temp 97.7°F | Resp 18 | Ht 67.0 in | Wt 216.2 lb

## 2022-01-29 DIAGNOSIS — J439 Emphysema, unspecified: Secondary | ICD-10-CM | POA: Diagnosis not present

## 2022-01-29 MED ORDER — BREZTRI AEROSPHERE 160-9-4.8 MCG/ACT IN AERO: 2.0000 | INHALATION_SPRAY | Freq: Two times a day (BID) | RESPIRATORY_TRACT | 11 refills | Status: DC

## 2022-01-29 NOTE — Patient Instructions (Signed)
Start the inhaler tomorrow.   Rinse your mouth out after use.   Stay active.   Let us know if you need anything.

## 2022-01-29 NOTE — Progress Notes (Signed)
Chief Complaint  Patient presents with   1 month follow up    Concerns/ questions: none Flu shot today: decline     Subjective: Patient is a 76 y.o. female here for f/u breathing.  Started on LAMA in addition to SABA last mo. Since that time, reports some 30-40% improvement. No AE's, reports compliance. Still having some SOB w exertion limiting her ADL's.   Past Medical History:  Diagnosis Date   Aortic atherosclerosis ([place])    CT 2019   Emphysema of lung ([place])    History of chicken pox    History of shingles    Osteoporosis     Objective: BP 122/80 (BP Location: Left Arm, Patient Position: Sitting, Cuff Size: Large)   Pulse 78   Temp 97.7 F (36.5 C) (Temporal)   Resp 18   Ht '5\' 7"'$  (1.702 m)   Wt 216 lb 3.2 oz (98.1 kg)   SpO2 93%   BMI 33.86 kg/m  General: Awake, appears stated age Heart: RRR, no LE edema Lungs: CTAB, no rales, wheezes or rhonchi. No accessory muscle use Psych: Age appropriate judgment and insight, normal affect and mood  Assessment and Plan: Pulmonary emphysema, unspecified emphysema type ([place]) - Plan: Budeson-Glycopyrrol-Formoterol (BREZTRI AEROSPHERE) 160-9-4.8 MCG/ACT AERO  Chronic, uncontrolled. Stop Incruse, start Breztri. This should be helpful for her. F/u in 1 mo.  The patient voiced understanding and agreement to the plan.  [place], DO 01/29/22  10:01 AM

## 2022-02-01 ENCOUNTER — Other Ambulatory Visit: Payer: Self-pay | Admitting: Family Medicine

## 2022-02-01 DIAGNOSIS — J302 Other seasonal allergic rhinitis: Secondary | ICD-10-CM

## 2022-02-05 ENCOUNTER — Other Ambulatory Visit: Payer: Self-pay | Admitting: Family Medicine

## 2022-02-26 ENCOUNTER — Encounter: Payer: Self-pay | Admitting: Family Medicine

## 2022-02-26 ENCOUNTER — Ambulatory Visit (INDEPENDENT_AMBULATORY_CARE_PROVIDER_SITE_OTHER): Payer: No Typology Code available for payment source | Admitting: Family Medicine

## 2022-02-26 VITALS — BP 122/82 | HR 81 | Temp 98.3°F | Ht 67.0 in | Wt 219.0 lb

## 2022-02-26 DIAGNOSIS — K0889 Other specified disorders of teeth and supporting structures: Secondary | ICD-10-CM | POA: Diagnosis not present

## 2022-02-26 DIAGNOSIS — J439 Emphysema, unspecified: Secondary | ICD-10-CM

## 2022-02-26 MED ORDER — AMOXICILLIN 500 MG PO CAPS: 500.0000 mg | ORAL_CAPSULE | Freq: Two times a day (BID) | ORAL | 0 refills | Status: AC

## 2022-02-26 NOTE — Progress Notes (Signed)
Chief Complaint  Patient presents with   Follow-up    Subjective: Patient is a 76 y.o. female here for COPD f/u.  Changed from Incruse to [employer]. Reports compliance, no AE's. She is around 75% improvement since starting. She is able to do ADL's that she is not able to now.   Dental pain for a few days, hx of this. Does well with amoxicillin. No fevers.   Past Medical History:  Diagnosis Date   Aortic atherosclerosis ([place])    CT 2019   Emphysema of lung ([place])    History of chicken pox    History of shingles    Osteoporosis     Objective: BP 122/82 (BP Location: Left Arm, Patient Position: Sitting, Cuff Size: Normal)   Pulse 81   Temp 98.3 F (36.8 C) (Oral)   Ht '5\' 7"'$  (1.702 m)   Wt 219 lb (99.3 kg)   SpO2 96%   BMI 34.30 kg/m  General: Awake, appears stated age Heart: RRR Extremities: No clubbing.  Lungs: CTAB, no rales, wheezes or rhonchi. No accessory muscle use Psych: Age appropriate judgment and insight, normal affect and mood  Assessment and Plan: Pulmonary emphysema, unspecified emphysema type ([place])  Dentalgia  Chronic, stable. Cont Breztri. Amox sent in, find a dentist. F/u in 6 mo.  The patient voiced understanding and agreement to the plan.  [place], DO 02/26/22  11:15 AM

## 2022-02-26 NOTE — Patient Instructions (Signed)
Keep up the good work.   Start increasing your exercising levels.  Let us know if you need anything.

## 2022-03-03 ENCOUNTER — Encounter (HOSPITAL_BASED_OUTPATIENT_CLINIC_OR_DEPARTMENT_OTHER): Payer: Self-pay

## 2022-03-03 ENCOUNTER — Ambulatory Visit (HOSPITAL_BASED_OUTPATIENT_CLINIC_OR_DEPARTMENT_OTHER)
Admission: RE | Admit: 2022-03-03 | Discharge: 2022-03-03 | Disposition: A | Discharge status: Home / Self Care | Payer: No Typology Code available for payment source | Source: Ambulatory Visit | Attending: Family Medicine | Admitting: Family Medicine

## 2022-03-03 ENCOUNTER — Other Ambulatory Visit: Payer: Self-pay | Admitting: Family Medicine

## 2022-03-03 DIAGNOSIS — J439 Emphysema, unspecified: Secondary | ICD-10-CM

## 2022-03-03 DIAGNOSIS — Z1231 Encounter for screening mammogram for malignant neoplasm of breast: Secondary | ICD-10-CM | POA: Insufficient documentation

## 2022-03-03 DIAGNOSIS — F17210 Nicotine dependence, cigarettes, uncomplicated: Secondary | ICD-10-CM | POA: Diagnosis not present

## 2022-03-03 DIAGNOSIS — I700 Atherosclerosis of aorta: Secondary | ICD-10-CM | POA: Diagnosis not present

## 2022-03-03 DIAGNOSIS — Z122 Encounter for screening for malignant neoplasm of respiratory organs: Secondary | ICD-10-CM | POA: Diagnosis not present

## 2022-03-03 DIAGNOSIS — F17200 Nicotine dependence, unspecified, uncomplicated: Secondary | ICD-10-CM

## 2022-03-05 ENCOUNTER — Other Ambulatory Visit: Payer: Self-pay | Admitting: Family Medicine

## 2022-03-05 DIAGNOSIS — Z122 Encounter for screening for malignant neoplasm of respiratory organs: Secondary | ICD-10-CM

## 2022-03-05 DIAGNOSIS — J439 Emphysema, unspecified: Secondary | ICD-10-CM

## 2022-03-05 DIAGNOSIS — R918 Other nonspecific abnormal finding of lung field: Secondary | ICD-10-CM

## 2022-03-13 ENCOUNTER — Other Ambulatory Visit: Payer: Self-pay | Admitting: Family Medicine

## 2022-03-13 DIAGNOSIS — I700 Atherosclerosis of aorta: Secondary | ICD-10-CM

## 2022-05-10 ENCOUNTER — Other Ambulatory Visit: Payer: Self-pay | Admitting: Family Medicine

## 2022-05-18 ENCOUNTER — Ambulatory Visit: Payer: No Typology Code available for payment source | Admitting: Family

## 2022-06-03 ENCOUNTER — Ambulatory Visit (HOSPITAL_BASED_OUTPATIENT_CLINIC_OR_DEPARTMENT_OTHER)
Admission: RE | Admit: 2022-06-03 | Discharge: 2022-06-03 | Disposition: A | Discharge status: Home / Self Care | Payer: No Typology Code available for payment source | Source: Ambulatory Visit | Attending: Family Medicine | Admitting: Family Medicine

## 2022-06-03 DIAGNOSIS — R918 Other nonspecific abnormal finding of lung field: Secondary | ICD-10-CM

## 2022-06-03 DIAGNOSIS — J439 Emphysema, unspecified: Secondary | ICD-10-CM | POA: Insufficient documentation

## 2022-06-03 DIAGNOSIS — Z122 Encounter for screening for malignant neoplasm of respiratory organs: Secondary | ICD-10-CM

## 2022-06-03 DIAGNOSIS — J479 Bronchiectasis, uncomplicated: Secondary | ICD-10-CM | POA: Diagnosis not present

## 2022-06-04 ENCOUNTER — Other Ambulatory Visit: Payer: Self-pay | Admitting: Family Medicine

## 2022-06-04 DIAGNOSIS — R918 Other nonspecific abnormal finding of lung field: Secondary | ICD-10-CM

## 2022-06-06 ENCOUNTER — Other Ambulatory Visit: Payer: Self-pay | Admitting: Family Medicine

## 2022-06-06 DIAGNOSIS — J302 Other seasonal allergic rhinitis: Secondary | ICD-10-CM

## 2022-06-20 NOTE — Progress Notes (Signed)
Synopsis: Referred for nodules, emphysema by Sharlene Dory*  Subjective:   PATIENT ID: Kelly Henderson GENDER: female DOB: [birth year], MRN: [id number]  Chief Complaint  Patient presents with   Consult    Abn CT scan. In lung cancer screening program.   76yF with history of pulmonary nodules, emphysema, OP, smoking quit 06/2016 (50y 2ppd), seasonal allergy referred for waxing and waning pulmonary nodules, lower lobe and RML predominant bronchiectasis  She doesn't have a cough. No chest congestion. Had bad bout of LRTI in early January. She thinks she had the flu. Never did take any courses of steroids or ABX. No fever. No unintentional weight loss.   She has never had a bronchoscopy before.   She also mentions very significant DOE that limits her activities severely.   Otherwise pertinent review of systems is negative.  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   Past Medical History:  Diagnosis Date   Aortic atherosclerosis (HCC)    CT 2019   Emphysema of lung (HCC)    History of chicken pox    History of shingles    Osteoporosis      Family History  Problem Relation Age of Onset   Cancer Mother        Uterus   Alcohol abuse Father    Heart disease Father    Heart attack Son    Colon polyps Neg Hx    Colon cancer Neg Hx    Esophageal cancer Neg Hx    Rectal cancer Neg Hx    Stomach cancer Neg Hx      Past Surgical History:  Procedure Laterality Date   ABDOMINAL HYSTERECTOMY     AUGMENTATION MAMMAPLASTY Bilateral    Silicone, in front of muscle   BREAST BIOPSY     Patient has had 3 biopsy   BREAST ENHANCEMENT SURGERY  1975   TONSILLECTOMY      Social History   Socioeconomic History   Marital status: Married    Spouse name: Not on file   Number of children: Not on file   Years of education: Not on file   Highest education level: Not on file  Occupational History   Not on file  Tobacco Use   Smoking status: Former     Packs/day: 2.00    Years: 50.00    Total pack years: 100.00    Types: Cigarettes    Quit date: 07/01/2016    Years since quitting: 5.9   Smokeless tobacco: Never  Vaping Use   Vaping Use: Never used  Substance and Sexual Activity   Alcohol use: No   Drug use: No   Sexual activity: Yes    Birth control/protection: Surgical  Other Topics Concern   Not on file  Social History Narrative   Not on file   Social Determinants of Health   Financial Resource Strain: Low Risk  (12/04/2020)   Overall Financial Resource Strain (CARDIA)    Difficulty of Paying Living Expenses: Not hard at all  Food Insecurity: No Food Insecurity (12/04/2020)   Hunger Vital Sign    Worried About Running Out of Food in the Last Year: Never true    Ran Out of Food in the Last Year: Never true  Transportation Needs: No Transportation Needs (12/04/2020)   PRAPARE - Administrator, Civil Service (Medical): No    Lack of Transportation (Non-Medical): No  Physical Activity: Inactive (12/04/2020)   Exercise Vital  Sign    Days of Exercise per Week: 0 days    Minutes of Exercise per Session: 0 min  Stress: No Stress Concern Present (12/04/2020)   [employer] of Occupational Health - Occupational Stress Questionnaire    Feeling of Stress : Not at all  Social Connections: Moderately Integrated (12/04/2020)   Social Connection and Isolation Panel [NHANES]    Frequency of Communication with Friends and Family: More than three times a week    Frequency of Social Gatherings with Friends and Family: More than three times a week    Attends Religious Services: More than 4 times per year    Active Member of [employer] or Organizations: No    Attends Banker Meetings: Never    Marital Status: Married  Catering manager Violence: Not At Risk (12/04/2020)   Humiliation, Afraid, Rape, and Kick questionnaire    Fear of Current or Ex-Partner: No    Emotionally Abused: No    Physically Abused: No     Sexually Abused: No     Allergies  Allergen Reactions   Latex Hives   Morphine And Related Swelling     Outpatient Medications Prior to Visit  Medication Sig Dispense Refill   albuterol (VENTOLIN HFA) 108 (90 Base) MCG/ACT inhaler TAKE 2 PUFFS BY MOUTH EVERY 6 HOURS AS NEEDED FOR WHEEZE OR SHORTNESS OF BREATH 18 g 2   Budeson-Glycopyrrol-Formoterol (BREZTRI AEROSPHERE) 160-9-4.8 MCG/ACT AERO Inhale 2 puffs into the lungs 2 (two) times daily. Rinse mouth out after use. 10.7 g 11   fluticasone (FLONASE) 50 MCG/ACT nasal spray Place 2 sprays into both nostrils daily. 16 g 6   montelukast (SINGULAIR) 10 MG tablet TAKE 1 TABLET BY MOUTH EVERYDAY AT BEDTIME 30 tablet 3   rosuvastatin (CRESTOR) 20 MG tablet TAKE 1 TABLET BY MOUTH EVERY DAY 90 tablet 3   Vitamin D, Ergocalciferol, (DRISDOL) 1.25 MG (50000 UNIT) CAPS capsule TAKE 1 CAPSULE (50,000 UNITS TOTAL) BY MOUTH EVERY 7 (SEVEN) DAYS 12 capsule 0   No facility-administered medications prior to visit.       Objective:   Physical Exam:  General appearance: 77 y.o., female, NAD, conversant  Eyes: anicteric sclerae; PERRL, tracking appropriately HENT: NCAT; MMM Neck: Trachea midline; no lymphadenopathy, no JVD Lungs: CTAB, no crackles, no wheeze, with normal respiratory effort CV: RRR, no murmur  Abdomen: Soft, non-tender; non-distended, BS present  Extremities: No peripheral edema, warm Skin: Normal turgor and texture; no rash Psych: Appropriate affect Neuro: Alert and oriented to person and place, no focal deficit     Vitals:   06/22/22 1443  BP: 118/68  Pulse: 95  Temp: 97.7 F (36.5 C)  TempSrc: Oral  SpO2: 93%  Weight: 219 lb 9.6 oz (99.6 kg)  Height: 5\' 7"  (1.702 m)   93% on RA BMI Readings from Last 3 Encounters:  06/22/22 34.39 kg/m  02/26/22 34.30 kg/m  01/29/22 33.86 kg/m   Wt Readings from Last 3 Encounters:  06/22/22 219 lb 9.6 oz (99.6 kg)  02/26/22 219 lb (99.3 kg)  01/29/22 216 lb 3.2 oz (98.1  kg)     CBC    Component Value Date/Time   WBC 5.4 08/19/2016 1043   RBC 4.99 08/19/2016 1043   HGB 14.6 08/19/2016 1043   HCT 44.7 08/19/2016 1043   PLT 233.0 08/19/2016 1043   MCV 89.6 08/19/2016 1043   MCHC 32.6 08/19/2016 1043   RDW 14.3 08/19/2016 1043   LYMPHSABS 1.5 12/25/2008 0000   MONOABS 0.3  12/25/2008 0000   EOSABS 0.0 12/25/2008 0000   BASOSABS 0.0 12/25/2008 0000      Chest Imaging: CT 06/04/22 reviewed by me with waxing and waning pulmonary nodules, RML, lower lobe predominant bronchiectasis  Pulmonary Functions Testing Results:     No data to display              Assessment & Plan:   # COPD gold B  # Waxing and waning pulmonary nodules  Recent nodules may represent resolving multifocal pneumonia from her infection in early Jan vs indolent infection like NTM or chronic aspergillosis  # Chronic hypoxic respiratory failure Needs 4L with exertion and sleep  Plan: - CT Chest in 8 weeks - strong chance this was related to your pneumonia (can take 4-6 weeks for CT scan to look better even though you feel better,) you could also have a low grade infection from something like nontuberculous mycobacteria (a lot of patients never even realize they're infected with this), but we'll keep our eye out for the possibility of cancer with repeat ct scan in 10 weeks - breathing tests in 10 weeks on same day as next appointment - continue breztri 2 puffs twice daily - albuterol as needed - pulmonary rehab after breathing tests     Omar Person, MD [facility] 06/22/2022 2:53 PM

## 2022-06-22 ENCOUNTER — Ambulatory Visit (INDEPENDENT_AMBULATORY_CARE_PROVIDER_SITE_OTHER): Payer: No Typology Code available for payment source | Admitting: Student

## 2022-06-22 ENCOUNTER — Encounter: Payer: Self-pay | Admitting: Student

## 2022-06-22 VITALS — BP 118/68 | HR 95 | Temp 97.7°F | Ht 67.0 in | Wt 219.6 lb

## 2022-06-22 DIAGNOSIS — R918 Other nonspecific abnormal finding of lung field: Secondary | ICD-10-CM

## 2022-06-22 DIAGNOSIS — R0600 Dyspnea, unspecified: Secondary | ICD-10-CM | POA: Diagnosis not present

## 2022-06-22 NOTE — Patient Instructions (Signed)
-   CT Chest in 8 weeks - strong chance this was related to your pneumonia (can take 4-6 weeks for CT scan to look better even though you feel better,) you could also have a low grade infection from something like nontuberculous mycobacteria (a lot of patients never even realize they're infected with this), but we'll keep our eye out for the possibility of cancer with repeat ct scan in 10 weeks - breathing tests in 10 weeks on same day as next appointment - continue breztri 2 puffs twice daily - albuterol as needed - pulmonary rehab after breathing tests

## 2022-07-21 ENCOUNTER — Other Ambulatory Visit: Payer: Self-pay | Admitting: Family Medicine

## 2022-07-22 ENCOUNTER — Encounter: Payer: Self-pay | Admitting: Family Medicine

## 2022-07-22 NOTE — Telephone Encounter (Signed)
Does pt need an office visit for letter?

## 2022-07-23 ENCOUNTER — Encounter: Payer: Self-pay | Admitting: Family Medicine

## 2022-07-29 ENCOUNTER — Encounter: Payer: Self-pay | Admitting: Family Medicine

## 2022-07-29 ENCOUNTER — Ambulatory Visit: Payer: No Typology Code available for payment source | Admitting: Student

## 2022-07-29 DIAGNOSIS — R0600 Dyspnea, unspecified: Secondary | ICD-10-CM | POA: Diagnosis not present

## 2022-07-29 LAB — PULMONARY FUNCTION TEST
DL/VA % pred: 70 %
DL/VA: 2.82 ml/min/mmHg/L
DLCO cor % pred: 60 %
DLCO cor: 12.86 ml/min/mmHg
DLCO unc % pred: 60 %
DLCO unc: 12.86 ml/min/mmHg
FEF 25-75 Post: 0.73 L/sec
FEF 25-75 Pre: 0.48 L/sec
FEF2575-%Change-Post: 53 %
FEF2575-%Pred-Post: 40 %
FEF2575-%Pred-Pre: 26 %
FEV1-%Change-Post: 15 %
FEV1-%Pred-Post: 58 %
FEV1-%Pred-Pre: 50 %
FEV1-Post: 1.39 L
FEV1-Pre: 1.21 L
FEV1FVC-%Change-Post: 5 %
FEV1FVC-%Pred-Pre: 71 %
FEV6-%Change-Post: 10 %
FEV6-%Pred-Post: 77 %
FEV6-%Pred-Pre: 70 %
FEV6-Post: 2.37 L
FEV6-Pre: 2.15 L
FEV6FVC-%Change-Post: 0 %
FEV6FVC-%Pred-Post: 100 %
FEV6FVC-%Pred-Pre: 99 %
FVC-%Change-Post: 9 %
FVC-%Pred-Post: 77 %
FVC-%Pred-Pre: 70 %
FVC-Post: 2.48 L
FVC-Pre: 2.26 L
Post FEV1/FVC ratio: 56 %
Post FEV6/FVC ratio: 96 %
Pre FEV1/FVC ratio: 53 %
Pre FEV6/FVC Ratio: 95 %
RV % pred: 142 %
RV: 3.47 L
TLC % pred: 107 %
TLC: 5.89 L

## 2022-07-29 NOTE — Patient Instructions (Signed)
Full PFT performed today. °

## 2022-07-29 NOTE — Progress Notes (Signed)
Full PFT performed today. °

## 2022-08-17 ENCOUNTER — Ambulatory Visit (HOSPITAL_BASED_OUTPATIENT_CLINIC_OR_DEPARTMENT_OTHER)
Admission: RE | Admit: 2022-08-17 | Discharge: 2022-08-17 | Disposition: A | Discharge status: Home / Self Care | Payer: No Typology Code available for payment source | Source: Ambulatory Visit | Attending: Family Medicine | Admitting: Family Medicine

## 2022-08-17 DIAGNOSIS — R918 Other nonspecific abnormal finding of lung field: Secondary | ICD-10-CM | POA: Insufficient documentation

## 2022-08-17 DIAGNOSIS — J181 Lobar pneumonia, unspecified organism: Secondary | ICD-10-CM | POA: Diagnosis not present

## 2022-08-17 DIAGNOSIS — J432 Centrilobular emphysema: Secondary | ICD-10-CM | POA: Diagnosis not present

## 2022-08-23 NOTE — Progress Notes (Signed)
Synopsis: Referred for nodules, emphysema by Sharlene Dory*  Subjective:   PATIENT ID: Kelly Henderson GENDER: female DOB: 1946-03-06, MRN: [id number]  No chief complaint on file.  76yF with history of pulmonary nodules, emphysema, OP, smoking quit 06/2016 (50y 2ppd), seasonal allergy referred for waxing and waning pulmonary nodules, lower lobe and RML predominant bronchiectasis  She doesn't have a cough. No chest congestion. Had bad bout of LRTI in early January. She thinks she had the flu. Never did take any courses of steroids or ABX. No fever. No unintentional weight loss.   She has never had a bronchoscopy before.   She also mentions very significant DOE that limits her activities severely.  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   Interval HPI  PFT with Moderately severe obstruction with air trapping, borderline BD response and mildly reduced diffusing capacity  CT Chest with interval improvement  Otherwise pertinent review of systems is negative.  Past Medical History:  Diagnosis Date   Aortic atherosclerosis    CT 2019   Emphysema of lung    History of chicken pox    History of shingles    Osteoporosis      Family History  Problem Relation Age of Onset   Cancer Mother        Uterus   Alcohol abuse Father    Heart disease Father    Heart attack Son    Colon polyps Neg Hx    Colon cancer Neg Hx    Esophageal cancer Neg Hx    Rectal cancer Neg Hx    Stomach cancer Neg Hx      Past Surgical History:  Procedure Laterality Date   ABDOMINAL HYSTERECTOMY     AUGMENTATION MAMMAPLASTY Bilateral    Silicone, in front of muscle   BREAST BIOPSY     Patient has had 3 biopsy   BREAST ENHANCEMENT SURGERY  1975   TONSILLECTOMY      Social History   Socioeconomic History   Marital status: Married    Spouse name: Not on file   Number of children: Not on file   Years of education: Not on file   Highest education level:  Bachelor's degree (e.g., BA, AB, BS)  Occupational History   Not on file  Tobacco Use   Smoking status: Former    Packs/day: 2.00    Years: 50.00    Additional pack years: 0.00    Total pack years: 100.00    Types: Cigarettes    Quit date: 07/01/2016    Years since quitting: 6.1   Smokeless tobacco: Never  Vaping Use   Vaping Use: Never used  Substance and Sexual Activity   Alcohol use: No   Drug use: No   Sexual activity: Yes    Birth control/protection: Surgical  Other Topics Concern   Not on file  Social History Narrative   Not on file   Social Determinants of Health   Financial Resource Strain: Low Risk  (08/23/2022)   Overall Financial Resource Strain (CARDIA)    Difficulty of Paying Living Expenses: Not hard at all  Food Insecurity: No Food Insecurity (08/23/2022)   Hunger Vital Sign    Worried About Running Out of Food in the Last Year: Never true    Ran Out of Food in the Last Year: Never true  Transportation Needs: No Transportation Needs (08/23/2022)   PRAPARE - Administrator, Civil Service (Medical): No  Lack of Transportation (Non-Medical): No  Physical Activity: Unknown (08/23/2022)   Exercise Vital Sign    Days of Exercise per Week: Patient declined    Minutes of Exercise per Session: Not on file  Stress: No Stress Concern Present (08/23/2022)   [employer] of Occupational Health - Occupational Stress Questionnaire    Feeling of Stress : Only a little  Social Connections: Unknown (08/23/2022)   Social Connection and Isolation Panel [NHANES]    Frequency of Communication with Friends and Family: More than three times a week    Frequency of Social Gatherings with Friends and Family: Once a week    Attends Religious Services: Patient declined    Database administrator or Organizations: Yes    Attends Banker Meetings: Patient declined    Marital Status: Married  Catering manager Violence: Not At Risk (12/04/2020)   Humiliation,  Afraid, Rape, and Kick questionnaire    Fear of Current or Ex-Partner: No    Emotionally Abused: No    Physically Abused: No    Sexually Abused: No     Allergies  Allergen Reactions   Latex Hives   Morphine And Related Swelling     Outpatient Medications Prior to Visit  Medication Sig Dispense Refill   albuterol (VENTOLIN HFA) 108 (90 Base) MCG/ACT inhaler TAKE 2 PUFFS BY MOUTH EVERY 6 HOURS AS NEEDED FOR WHEEZE OR SHORTNESS OF BREATH 18 g 2   Budeson-Glycopyrrol-Formoterol (BREZTRI AEROSPHERE) 160-9-4.8 MCG/ACT AERO Inhale 2 puffs into the lungs 2 (two) times daily. Rinse mouth out after use. 10.7 g 11   fluticasone (FLONASE) 50 MCG/ACT nasal spray Place 2 sprays into both nostrils daily. 16 g 6   montelukast (SINGULAIR) 10 MG tablet TAKE 1 TABLET BY MOUTH EVERYDAY AT BEDTIME 30 tablet 3   rosuvastatin (CRESTOR) 20 MG tablet TAKE 1 TABLET BY MOUTH EVERY DAY 90 tablet 3   Vitamin D, Ergocalciferol, (DRISDOL) 1.25 MG (50000 UNIT) CAPS capsule TAKE 1 CAPSULE (50,000 UNITS TOTAL) BY MOUTH EVERY 7 (SEVEN) DAYS 12 capsule 0   No facility-administered medications prior to visit.       Objective:   Physical Exam:  General appearance: 77 y.o., female, NAD, conversant  Eyes: anicteric sclerae; PERRL, tracking appropriately HENT: NCAT; MMM Neck: Trachea midline; no lymphadenopathy, no JVD Lungs: CTAB, no crackles, no wheeze, with normal respiratory effort CV: RRR, no murmur  Abdomen: Soft, non-tender; non-distended, BS present  Extremities: No peripheral edema, warm Skin: Normal turgor and texture; no rash Psych: Appropriate affect Neuro: Alert and oriented to person and place, no focal deficit     There were no vitals filed for this visit.    on RA BMI Readings from Last 3 Encounters:  06/22/22 34.39 kg/m  02/26/22 34.30 kg/m  01/29/22 33.86 kg/m   Wt Readings from Last 3 Encounters:  06/22/22 219 lb 9.6 oz (99.6 kg)  02/26/22 219 lb (99.3 kg)  01/29/22 216 lb 3.2  oz (98.1 kg)     CBC    Component Value Date/Time   WBC 5.4 08/19/2016 1043   RBC 4.99 08/19/2016 1043   HGB 14.6 08/19/2016 1043   HCT 44.7 08/19/2016 1043   PLT 233.0 08/19/2016 1043   MCV 89.6 08/19/2016 1043   MCHC 32.6 08/19/2016 1043   RDW 14.3 08/19/2016 1043   LYMPHSABS 1.5 12/25/2008 0000   MONOABS 0.3 12/25/2008 0000   EOSABS 0.0 12/25/2008 0000   BASOSABS 0.0 12/25/2008 0000      Chest Imaging:  CT 06/04/22 reviewed by me with waxing and waning pulmonary nodules, RML, lower lobe predominant bronchiectasis  CT Chest 08/18/22 with decrease in size of scattered nodular consolidation  Pulmonary Functions Testing Results:    Latest Ref Rng & Units 07/29/2022    3:47 PM  PFT Results  FVC-Pre L 2.26   FVC-Predicted Pre % 70   FVC-Post L 2.48   FVC-Predicted Post % 77   Pre FEV1/FVC % % 53   Post FEV1/FCV % % 56   FEV1-Pre L 1.21   FEV1-Predicted Pre % 50   FEV1-Post L 1.39   DLCO uncorrected ml/min/mmHg 12.86   DLCO UNC% % 60   DLCO corrected ml/min/mmHg 12.86   DLCO COR %Predicted % 60   DLVA Predicted % 70   TLC L 5.89   TLC % Predicted % 107   RV % Predicted % 142    Moderately severe obstruction with air trapping, borderline BD response and mildly reduced diffusing capacity     Assessment & Plan:   # COPD gold B  # Waxing and waning pulmonary nodules  Recent nodules may represent resolving multifocal pneumonia from her infection in early Jan vs indolent infection like NTM or chronic aspergillosis  # Chronic hypoxic respiratory failure Needs 4L with exertion and sleep  Plan: - CT Chest in 8 weeks - strong chance this was related to your pneumonia (can take 4-6 weeks for CT scan to look better even though you feel better,) you could also have a low grade infection from something like nontuberculous mycobacteria (a lot of patients never even realize they're infected with this), but we'll keep our eye out for the possibility of cancer with repeat ct  scan in 10 weeks - breathing tests in 10 weeks on same day as next appointment - continue breztri 2 puffs twice daily - albuterol as needed - pulmonary rehab after breathing tests     Omar Person, MD [facility] 08/23/2022 5:23 PM

## 2022-08-25 ENCOUNTER — Encounter: Payer: Self-pay | Admitting: Student

## 2022-08-25 ENCOUNTER — Ambulatory Visit (INDEPENDENT_AMBULATORY_CARE_PROVIDER_SITE_OTHER): Payer: No Typology Code available for payment source | Admitting: Student

## 2022-08-25 VITALS — BP 128/80 | HR 87 | Ht 67.0 in | Wt 220.0 lb

## 2022-08-25 DIAGNOSIS — J449 Chronic obstructive pulmonary disease, unspecified: Secondary | ICD-10-CM

## 2022-08-25 NOTE — Patient Instructions (Signed)
-   You'll be called to schedule pulmonary rehab, follow up CT Chest - If you want me to get you set up with oxygen with exertion, sleep then call our office - call and make appointment to see [place] if worsening trouble breathing, cough, or desire change in medications

## 2022-08-30 ENCOUNTER — Other Ambulatory Visit: Payer: Self-pay | Admitting: Family Medicine

## 2022-08-30 ENCOUNTER — Ambulatory Visit (INDEPENDENT_AMBULATORY_CARE_PROVIDER_SITE_OTHER): Payer: No Typology Code available for payment source | Admitting: Family Medicine

## 2022-08-30 ENCOUNTER — Encounter: Payer: Self-pay | Admitting: Family Medicine

## 2022-08-30 VITALS — BP 121/81 | HR 78 | Temp 98.2°F | Ht 67.0 in | Wt 220.2 lb

## 2022-08-30 DIAGNOSIS — J439 Emphysema, unspecified: Secondary | ICD-10-CM

## 2022-08-30 DIAGNOSIS — I700 Atherosclerosis of aorta: Secondary | ICD-10-CM

## 2022-08-30 LAB — LIPID PANEL
Cholesterol: 142 mg/dL (ref 0–200)
HDL: 71.7 mg/dL (ref >39.00)
LDL Cholesterol: 55 mg/dL (ref 0–99)
NonHDL: 70.66
Total CHOL/HDL Ratio: 2
Triglycerides: 78 mg/dL (ref 0.0–149.0)
VLDL: 15.6 mg/dL (ref 0.0–40.0)

## 2022-08-30 LAB — COMPREHENSIVE METABOLIC PANEL
ALT: 17 U/L (ref 0–35)
AST: 17 U/L (ref 0–37)
Albumin: 4.1 g/dL (ref 3.5–5.2)
Alkaline Phosphatase: 92 U/L (ref 39–117)
BUN: 18 mg/dL (ref 6–23)
CO2: 30 mEq/L (ref 19–32)
Calcium: 9.3 mg/dL (ref 8.4–10.5)
Chloride: 103 mEq/L (ref 96–112)
Creatinine, Ser: 0.82 mg/dL (ref 0.40–1.20)
GFR: 69.94 mL/min (ref >60.00)
Glucose, Bld: 90 mg/dL (ref 70–99)
Potassium: 4.9 mEq/L (ref 3.5–5.1)
Sodium: 141 mEq/L (ref 135–145)
Total Bilirubin: 0.5 mg/dL (ref 0.2–1.2)
Total Protein: 7.1 g/dL (ref 6.0–8.3)

## 2022-08-30 MED ORDER — PROMETHAZINE HCL 25 MG RE SUPP: 25.0000 mg | Freq: Three times a day (TID) | RECTAL | 0 refills | Status: DC | PRN

## 2022-08-30 NOTE — Patient Instructions (Signed)
Give [place] 2-3 business days to get the results of your labs back.   Keep the diet clean and stay active.  Please consider counseling. Contact [phone] to schedule an appointment or inquire about cost/insurance coverage.  [facility] located at [address], [address], [place], [place].  Phone number = [phone].  Dr. Regan Lemming - Adult Psychiatry.    [facility] located at [address] [place], [place], [place]. Phone number = [phone].   [facility] located at [address] [place], [place].  Phone number = [phone].   [facility] located at [address] [place], [place], [place].  Phone number = [phone].

## 2022-08-30 NOTE — Progress Notes (Signed)
Chief Complaint  Patient presents with   Follow-up    medication    Subjective: Hyperlipidemia Patient presents for Hyperlipidemia follow up. Currently taking Crestor 20 mg/d and compliance with treatment thus far has been good. She denies myalgias. She is usually adhering to a healthy diet. Exercise: starting pulm rehab The patient is not known to have coexisting coronary artery disease. No CP or SOB.  Emphysema Taking Breztri 2 puffs bid. Reports compliance, no AE's. Albuterol prn, rarely uses. Breathing is much better since changing over to triple inhaler. No longer is smoking.   Past Medical History:  Diagnosis Date   Aortic atherosclerosis    CT 2019   Emphysema of lung    History of chicken pox    History of shingles    Osteoporosis     Objective: BP 121/81 (BP Location: Left Arm, Patient Position: Sitting, Cuff Size: Large)   Pulse 78   Temp 98.2 F (36.8 C) (Oral)   Ht  (1.702 m)   Wt 220 lb 4 oz (99.9 kg)   SpO2 97%   BMI 34.50 kg/m  General: Awake, appears stated age HEENT: MMM Heart: RRR, no LE edema, no bruits Lungs: CTAB, no rales, wheezes or rhonchi. No accessory muscle use Psych: Age appropriate judgment and insight, normal affect and mood  Assessment and Plan: Aortic atherosclerosis  Pulmonary emphysema, unspecified emphysema type  Chronic, stable. Cont Crestor 20 mg/d.  Chronic, stable. Cont Breztri 2 puffs twice daily, albuterol as needed. F/u in 6 months or as needed. The patient voiced understanding and agreement to the plan.  Jilda Roche Allen, DO 08/30/22  11:06 AM

## 2022-09-06 ENCOUNTER — Encounter: Payer: Self-pay | Admitting: Student

## 2022-09-22 ENCOUNTER — Telehealth (HOSPITAL_COMMUNITY): Payer: Self-pay

## 2022-09-22 ENCOUNTER — Encounter (HOSPITAL_COMMUNITY): Payer: Self-pay

## 2022-09-22 NOTE — Telephone Encounter (Signed)
Attempted to call patient in regards to Pulmonary Rehab - LM on VM   Sent letter

## 2022-09-22 NOTE — Telephone Encounter (Signed)
Pt insurance is active and benefits verified through [facility] Co-pay $15, DED 0/0 met, out of pocket $3,600/$217.44 met, co-insurance 0%. no pre-authorization required, Jason/[employer]oted 09/22/22@11 :28, REF# [id number] 

## 2022-09-23 ENCOUNTER — Telehealth (HOSPITAL_COMMUNITY): Payer: Self-pay

## 2022-09-23 ENCOUNTER — Encounter (HOSPITAL_COMMUNITY): Payer: Self-pay

## 2022-09-23 NOTE — Telephone Encounter (Signed)
Called to confirm Pulm Rehab orientation appointment tomorrow at 1pm. Instructions and directions provided to patient.

## 2022-09-24 ENCOUNTER — Encounter (HOSPITAL_COMMUNITY): Payer: Self-pay

## 2022-09-24 ENCOUNTER — Encounter (HOSPITAL_COMMUNITY)
Admission: RE | Admit: 2022-09-24 | Discharge: 2022-09-24 | Disposition: A | Discharge status: Home / Self Care | Payer: No Typology Code available for payment source | Source: Ambulatory Visit | Attending: Student | Admitting: Student

## 2022-09-24 VITALS — BP 142/80 | HR 78 | Ht 67.0 in | Wt 223.3 lb

## 2022-09-24 DIAGNOSIS — J449 Chronic obstructive pulmonary disease, unspecified: Secondary | ICD-10-CM | POA: Insufficient documentation

## 2022-09-24 NOTE — Progress Notes (Signed)
Pulmonary Rehab Orientation Physical Assessment Note  Physical assessment reveals patient is alert and oriented x 4.  Heart rate is normal, breath sounds diminished, rhonchi in BLL. Pt denies chronic cough. Bowel sounds present x4 quads. Pt denies abdominal discomfort, nausea, vomiting, diarrhea or constipation. Grip strength equal, strong. Distal pulses +2; no swelling to lower extremities.   Essie Hart, RN, BSN

## 2022-09-24 NOTE — Progress Notes (Signed)
Kelly Henderson 77 y.o. female  Initial Psychosocial Assessment  Pt psychosocial assessment reveals pt lives with their spouse. Pt is currently retired. Pt hobbies include spending time with others and listening to music. Pt reports her stress level is low. Areas of stress/anxiety include family .  Pt does not exhibit signs of depression PHQ-9 is 0/1. Pt shows good  coping skills with positive outlook . Offered emotional support and reassurance. Monitor and evaluate progress toward psychosocial goal(s).  Goal(s): Improved management of stress Help patient work toward returning to meaningful activities that improve patient's QOL and are attainable with patient's lung disease   09/24/2022 3:12 PM

## 2022-09-24 NOTE — Progress Notes (Signed)
Pulmonary Individual Treatment Plan  Patient Details  Name: Kelly Henderson MRN: [id number] Date of Birth: Sep 06, 1945 Referring Provider:   [name] Pulmonary Rehab Walk Test from 09/24/2022 in [facility]  Referring Provider Meier       Initial Encounter Date:  Flowsheet Row Pulmonary Rehab Walk Test from 09/24/2022 in [facility]  Date 09/24/22       Visit Diagnosis: Stage 2 moderate COPD by GOLD classification (HCC)  Patient's Home Medications on Admission:   Current Outpatient Medications:    albuterol (VENTOLIN HFA) 108 (90 Base) MCG/ACT inhaler, TAKE 2 PUFFS BY MOUTH EVERY 6 HOURS AS NEEDED FOR WHEEZE OR SHORTNESS OF BREATH, Disp: 18 g, Rfl: 2   Budeson-Glycopyrrol-Formoterol (BREZTRI AEROSPHERE) 160-9-4.8 MCG/ACT AERO, Inhale 2 puffs into the lungs 2 (two) times daily. Rinse mouth out after use., Disp: 10.7 g, Rfl: 11   fluticasone (FLONASE) 50 MCG/ACT nasal spray, Place 2 sprays into both nostrils daily., Disp: 16 g, Rfl: 6   montelukast (SINGULAIR) 10 MG tablet, TAKE 1 TABLET BY MOUTH EVERYDAY AT BEDTIME, Disp: 30 tablet, Rfl: 3   rosuvastatin (CRESTOR) 20 MG tablet, TAKE 1 TABLET BY MOUTH EVERY DAY, Disp: 90 tablet, Rfl: 3   Vitamin D, Ergocalciferol, (DRISDOL) 1.25 MG (50000 UNIT) CAPS capsule, TAKE 1 CAPSULE (50,000 UNITS TOTAL) BY MOUTH EVERY 7 (SEVEN) DAYS, Disp: 12 capsule, Rfl: 0   promethazine (PHENERGAN) 25 MG suppository, PLACE 1 SUPPOSITORY (25 MG TOTAL) RECTALLY EVERY 8 (EIGHT) HOURS AS NEEDED FOR NAUSEA OR VOMITING. (Patient not taking: Reported on 09/24/2022), Disp: 12 suppository, Rfl: 0  Past Medical History: Past Medical History:  Diagnosis Date   Aortic atherosclerosis (HCC)    CT 2019   Emphysema of lung (HCC)    History of chicken pox    History of shingles    Osteoporosis     Tobacco Use: Social History   Tobacco Use   Smoking Status Former   Packs/day: 2.00   Years: 50.00   Additional pack years: 0.00   Total pack years: 100.00   Types: Cigarettes   Quit date: 07/01/2016   Years since quitting: 6.2  Smokeless Tobacco Never    Labs: Review Flowsheet  More data exists      Latest Ref Rng & Units 04/09/2019 03/09/2021 04/21/2021 12/28/2021 08/30/2022  Labs for ITP Cardiac and Pulmonary Rehab  Cholestrol 0 - 200 mg/dL 829  562  130  865  784   LDL (calc) 0 - 99 mg/dL 696  295  59  67  55   HDL-C >39.00 mg/dL 28.41  32.44  01.02  72.53  71.70   Trlycerides 0.0 - 149.0 mg/dL 66.4  40.3  47.4  25.9  78.0     Capillary Blood Glucose: No results found for: "GLUCAP"   Pulmonary Assessment Scores:  Pulmonary Assessment Scores     Row Name 09/24/22 1324         ADL UCSD   ADL Phase Entry     SOB Score total 26       CAT Score   CAT Score 11       mMRC Score   mMRC Score 2             UCSD: Self-administered rating of dyspnea associated with activities of daily living (ADLs) 6-point scale (0 = "not at all" to 5 = "maximal or unable to do  because of breathlessness")  Scoring Scores range from 0 to 120.  Minimally important difference is 5 units  CAT: CAT can identify the health impairment of COPD patients and is better correlated with disease progression.  CAT has a scoring range of zero to 40. The CAT score is classified into four groups of low (less than 10), medium (10 - 20), high (21-30) and very high (31-40) based on the impact level of disease on health status. A CAT score over 10 suggests significant symptoms.  A worsening CAT score could be explained by an exacerbation, poor medication adherence, poor inhaler technique, or progression of COPD or comorbid conditions.  CAT MCID is 2 points  mMRC: mMRC (Modified Medical Research Council) Dyspnea Scale is used to assess the degree of baseline functional disability in patients of respiratory disease due to dyspnea. No minimal  important difference is established. A decrease in score of 1 point or greater is considered a positive change.   Pulmonary Function Assessment:  Pulmonary Function Assessment - 09/24/22 1323       Breath   Bilateral Breath Sounds Decreased    Shortness of Breath Yes;Limiting activity             Exercise Target Goals: Exercise Program Goal: Individual exercise prescription set using results from initial 6 min walk test and THRR while considering  patient's activity barriers and safety.   Exercise Prescription Goal: Initial exercise prescription builds to 30-45 minutes a day of aerobic activity, 2-3 days per week.  Home exercise guidelines will be given to patient during program as part of exercise prescription that the participant will acknowledge.  Activity Barriers & Risk Stratification:  Activity Barriers & Cardiac Risk Stratification - 09/24/22 1331       Activity Barriers & Cardiac Risk Stratification   Activity Barriers Deconditioning;Muscular Weakness;Shortness of Breath             6 Minute Walk:  6 Minute Walk     Row Name 09/24/22 1600         6 Minute Walk   Phase Initial     Distance 556 feet     Walk Time 6 minutes     # of Rest Breaks 2  stopped at 1:50 for desat, 5:27 for desat     MPH 1.05     METS 1.28     RPE 12     Perceived Dyspnea  2     VO2 Peak 4.49     Symptoms Yes (comment)     Comments DOE     Resting HR 78 bpm     Resting BP 142/80     Resting Oxygen Saturation  90 %     Exercise Oxygen Saturation  during 6 min walk 79 %     Max Ex. HR 105 bpm     Max Ex. BP 174/98     2 Minute Post BP 142/76       Interval HR   1 Minute HR 79     2 Minute HR 96     3 Minute HR 83     4 Minute HR 85     5 Minute HR 105     6 Minute HR 96     2 Minute Post HR 81     Interval Heart Rate? Yes       Interval Oxygen   Interval Oxygen? Yes     Baseline Oxygen Saturation % 90 %  1 Minute Oxygen Saturation % 89 %  stopped at 1:50 O2  80%, applied 2L O2     1 Minute Liters of Oxygen 0 L     2 Minute Oxygen Saturation % 79 %  stopped at 2:20 to increase O2 and pt began PLB     2 Minute Liters of Oxygen 2 L     3 Minute Oxygen Saturation % 92 %     3 Minute Liters of Oxygen 3 L     4 Minute Oxygen Saturation % 88 %  4:21 stopped pt d/t low O2 at 84%, pt able to PLB and O2>88%     4 Minute Liters of Oxygen 3 L     5 Minute Oxygen Saturation % 84 %  5:27 resumed walk test, O2 90% on 4L     5 Minute Liters of Oxygen 4 L     6 Minute Oxygen Saturation % 93 %     6 Minute Liters of Oxygen 4 L     2 Minute Post Oxygen Saturation % 96 %  5 min post, 91% room air     2 Minute Post Liters of Oxygen 4 L              Oxygen Initial Assessment:  Oxygen Initial Assessment - 09/24/22 1322       Home Oxygen   Home Oxygen Device None    Sleep Oxygen Prescription None    Home Exercise Oxygen Prescription None    Home Resting Oxygen Prescription None      Initial 6 min Walk   Oxygen Used Continuous    Liters per minute 4      Program Oxygen Prescription   Program Oxygen Prescription Continuous    Liters per minute 4      Intervention   Short Term Goals To learn and exhibit compliance with exercise, home and travel O2 prescription;To learn and understand importance of maintaining oxygen saturations>88%;To learn and demonstrate proper use of respiratory medications;To learn and understand importance of monitoring SPO2 with pulse oximeter and demonstrate accurate use of the pulse oximeter.;To learn and demonstrate proper pursed lip breathing techniques or other breathing techniques.     Long  Term Goals Exhibits compliance with exercise, home  and travel O2 prescription;Verbalizes importance of monitoring SPO2 with pulse oximeter and return demonstration;Maintenance of O2 saturations>88%;Exhibits proper breathing techniques, such as pursed lip breathing or other method taught during program session;Compliance with respiratory  medication;Other             Oxygen Re-Evaluation:   Oxygen Discharge (Final Oxygen Re-Evaluation):   Initial Exercise Prescription:  Initial Exercise Prescription - 09/24/22 1500       Date of Initial Exercise RX and Referring Provider   Date 09/24/22    Referring Provider Meier    Expected Discharge Date 12/23/22      Oxygen   Oxygen Continuous    Liters 4    Maintain Oxygen Saturation 88% or higher      Recumbant Bike   Level 1    RPM 20    Watts 40    Minutes 15    METs 2      Track   Minutes 15    METs 1.5      Prescription Details   Frequency (times per week) 2    Duration Progress to 30 minutes of continuous aerobic without signs/symptoms of physical distress      Intensity   THRR 40-80% of  Max Heartrate [phone]    Ratings of Perceived Exertion 11-13    Perceived Dyspnea 0-4      Progression   Progression Continue to progress workloads to maintain intensity without signs/symptoms of physical distress.      Resistance Training   Training Prescription Yes    Weight red bands    Reps 10-15             Perform Capillary Blood Glucose checks as needed.  Exercise Prescription Changes:   Exercise Comments:   Exercise Goals and Review:   Exercise Goals     Row Name 09/24/22 1331             Exercise Goals   Increase Physical Activity Yes       Intervention Provide advice, education, support and counseling about physical activity/exercise needs.;Develop an individualized exercise prescription for aerobic and resistive training based on initial evaluation findings, risk stratification, comorbidities and participant's personal goals.       Expected Outcomes Short Term: Attend rehab on a regular basis to increase amount of physical activity.;Long Term: Add in home exercise to make exercise part of routine and to increase amount of physical activity.;Long Term: Exercising regularly at least 3-5 days a week.       Increase Strength and  Stamina Yes       Intervention Provide advice, education, support and counseling about physical activity/exercise needs.;Develop an individualized exercise prescription for aerobic and resistive training based on initial evaluation findings, risk stratification, comorbidities and participant's personal goals.       Expected Outcomes Short Term: Increase workloads from initial exercise prescription for resistance, speed, and METs.;Short Term: Perform resistance training exercises routinely during rehab and add in resistance training at home;Long Term: Improve cardiorespiratory fitness, muscular endurance and strength as measured by increased METs and functional capacity ( )       Able to understand and use rate of perceived exertion (RPE) scale Yes       Intervention Provide education and explanation on how to use RPE scale       Expected Outcomes Short Term: Able to use RPE daily in rehab to express subjective intensity level;Long Term:  Able to use RPE to guide intensity level when exercising independently       Able to understand and use Dyspnea scale Yes       Intervention Provide education and explanation on how to use Dyspnea scale       Expected Outcomes Short Term: Able to use Dyspnea scale daily in rehab to express subjective sense of shortness of breath during exertion;Long Term: Able to use Dyspnea scale to guide intensity level when exercising independently       Knowledge and understanding of Target Heart Rate Range (THRR) Yes       Intervention Provide education and explanation of THRR including how the numbers were predicted and where they are located for reference       Expected Outcomes Short Term: Able to state/look up THRR;Long Term: Able to use THRR to govern intensity when exercising independently;Short Term: Able to use daily as guideline for intensity in rehab       Understanding of Exercise Prescription Yes       Intervention Provide education, explanation, and written  materials on patient's individual exercise prescription       Expected Outcomes Short Term: Able to explain program exercise prescription;Long Term: Able to explain home exercise prescription to exercise independently  Exercise Goals Re-Evaluation :   Discharge Exercise Prescription (Final Exercise Prescription Changes):   Nutrition:  Target Goals: Understanding of nutrition guidelines, daily intake of sodium 1500mg , cholesterol 200mg , calories 30% from fat and 7% or less from saturated fats, daily to have 5 or more servings of fruits and vegetables.  Biometrics:   Post Biometrics - 09/24/22 1259        Post  Biometrics   Henderson Strength 22 kg             Nutrition Therapy Plan and Nutrition Goals:   Nutrition Assessments:  MEDIFICTS Score Key: ?70 Need to make dietary changes  40-70 Heart Healthy Diet ? 40 Therapeutic Level Cholesterol Diet   Picture Your Plate Scores: <40 Unhealthy dietary pattern with much room for improvement. 41-50 Dietary pattern unlikely to meet recommendations for good health and room for improvement. 51-60 More healthful dietary pattern, with some room for improvement.  >60 Healthy dietary pattern, although there may be some specific behaviors that could be improved.    Nutrition Goals Re-Evaluation:   Nutrition Goals Discharge (Final Nutrition Goals Re-Evaluation):   Psychosocial: Target Goals: Acknowledge presence or absence of significant depression and/or stress, maximize coping skills, provide positive support system. Participant is able to verbalize types and ability to use techniques and skills needed for reducing stress and depression.  Initial Review & Psychosocial Screening:  Initial Psych Review & Screening - 09/24/22 1312       Initial Review   Current issues with Current Anxiety/Panic    Comments Pt admits to anxiety but denies need for counseling and/or medication.      Family Dynamics   Good  Support System? Yes    Comments husband, daughter, 5 sisters      Barriers   Psychosocial barriers to participate in program There are no identifiable barriers or psychosocial needs.             Quality of Life Scores:  Scores of 19 and below usually indicate a poorer quality of life in these areas.  A difference of  2-3 points is a clinically meaningful difference.  A difference of 2-3 points in the total score of the Quality of Life Index has been associated with significant improvement in overall quality of life, self-image, physical symptoms, and general health in studies assessing change in quality of life.  PHQ-9: Review Flowsheet  More data exists      09/24/2022 12/28/2021 12/10/2021 12/04/2020 11/29/2019  Depression screen PHQ 2/9  Decreased Interest 0 0 0 0 0  Down, Depressed, Hopeless 0 0 0 0 0  PHQ - 2 Score 0 0 0 0 0  Altered sleeping 0 0 - - -  Tired, decreased energy 1 0 - - -  Change in appetite 0 0 - - -  Feeling bad or failure about yourself  0 0 - - -  Trouble concentrating 0 0 - - -  Moving slowly or fidgety/restless 0 0 - - -  Suicidal thoughts 0 0 - - -  PHQ-9 Score 1 0 - - -  Difficult doing work/chores Not difficult at all Not difficult at all - - -   Interpretation of Total Score  Total Score Depression Severity:  1-4 = Minimal depression, 5-9 = Mild depression, 10-14 = Moderate depression, 15-19 = Moderately severe depression, 20-27 = Severe depression   Psychosocial Evaluation and Intervention:  Psychosocial Evaluation - 09/24/22 1314       Psychosocial Evaluation & Interventions   Interventions  Stress management education;Relaxation education;Encouraged to exercise with the program and follow exercise prescription    Comments Kelly Henderson admits to anxiety but denies the need for counselor and/or medication.    Expected Outcomes For Kelly Henderson to participate in rehab free of psychosocial barriers    Continue Psychosocial Services  Follow up required by  counselor             Psychosocial Re-Evaluation:   Psychosocial Discharge (Final Psychosocial Re-Evaluation):   Education: Education Goals: Education classes will be provided on a weekly basis, covering required topics. Participant will state understanding/return demonstration of topics presented.  Learning Barriers/Preferences:  Learning Barriers/Preferences - 09/24/22 1316       Learning Barriers/Preferences   Learning Barriers Sight   wears glasses   Learning Preferences None             Education Topics: Introduction to Pulmonary Rehab Group instruction provided by PowerPoint, verbal discussion, and written material to support subject matter. Instructor reviews what Pulmonary Rehab is, the purpose of the program, and how patients are referred.     Know Your Numbers Group instruction that is supported by a PowerPoint presentation. Instructor discusses importance of knowing and understanding resting, exercise, and post-exercise oxygen saturation, heart rate, and blood pressure. Oxygen saturation, heart rate, blood pressure, rating of perceived exertion, and dyspnea are reviewed along with a normal range for these values.    Exercise for the Pulmonary Patient Group instruction that is supported by a PowerPoint presentation. Instructor discusses benefits of exercise, core components of exercise, frequency, duration, and intensity of an exercise routine, importance of utilizing pulse oximetry during exercise, safety while exercising, and options of places to exercise outside of rehab.    MET Level  Group instruction provided by PowerPoint, verbal discussion, and written material to support subject matter. Instructor reviews what METs are and how to increase METs.    Pulmonary Medications Verbally interactive group education provided by instructor with focus on inhaled medications and proper administration.   Anatomy and Physiology of the Respiratory System Group  instruction provided by PowerPoint, verbal discussion, and written material to support subject matter. Instructor reviews respiratory cycle and anatomical components of the respiratory system and their functions. Instructor also reviews differences in obstructive and restrictive respiratory diseases with examples of each.    Oxygen Safety Group instruction provided by PowerPoint, verbal discussion, and written material to support subject matter. There is an overview of "What is Oxygen" and "Why do we need it".  Instructor also reviews how to create a safe environment for oxygen use, the importance of using oxygen as prescribed, and the risks of noncompliance. There is a brief discussion on traveling with oxygen and resources the patient may utilize.   Oxygen Use Group instruction provided by PowerPoint, verbal discussion, and written material to discuss how supplemental oxygen is prescribed and different types of oxygen supply systems. Resources for more information are provided.    Breathing Techniques Group instruction that is supported by demonstration and informational handouts. Instructor discusses the benefits of pursed lip and diaphragmatic breathing and detailed demonstration on how to perform both.     Risk Factor Reduction Group instruction that is supported by a PowerPoint presentation. Instructor discusses the definition of a risk factor, different risk factors for pulmonary disease, and how the heart and lungs work together.   MD Day A group question and answer session with a medical doctor that allows participants to ask questions that relate to their pulmonary disease state.  Nutrition for the Pulmonary Patient Group instruction provided by PowerPoint slides, verbal discussion, and written materials to support subject matter. The instructor gives an explanation and review of healthy diet recommendations, which includes a discussion on weight management, recommendations for  fruit and vegetable consumption, as well as protein, fluid, caffeine, fiber, sodium, sugar, and alcohol. Tips for eating when patients are short of breath are discussed.    Other Education Group or individual verbal, written, or video instructions that support the educational goals of the pulmonary rehab program.    Knowledge Questionnaire Score:  Knowledge Questionnaire Score - 09/24/22 1519       Knowledge Questionnaire Score   Pre Score 17/18             Core Components/Risk Factors/Patient Goals at Admission:  Personal Goals and Risk Factors at Admission - 09/24/22 1316       Core Components/Risk Factors/Patient Goals on Admission    Weight Management Yes;Weight Loss    Intervention Weight Management: Develop a combined nutrition and exercise program designed to reach desired caloric intake, while maintaining appropriate intake of nutrient and fiber, sodium and fats, and appropriate energy expenditure required for the weight goal.;Weight Management: Provide education and appropriate resources to help participant work on and attain dietary goals.;Weight Management/Obesity: Establish reasonable short term and long term weight goals.;Obesity: Provide education and appropriate resources to help participant work on and attain dietary goals.    Expected Outcomes Short Term: Continue to assess and modify interventions until short term weight is achieved;Long Term: Adherence to nutrition and physical activity/exercise program aimed toward attainment of established weight goal;Weight Maintenance: Understanding of the daily nutrition guidelines, which includes 25-35% calories from fat, 7% or less cal from saturated fats, less than 200mg  cholesterol, less than 1.5gm of sodium, & 5 or more servings of fruits and vegetables daily;Weight Loss: Understanding of general recommendations for a balanced deficit meal plan, which promotes 1-2 lb weight loss per week and includes a negative energy balance  of [phone] kcal/d;Understanding recommendations for meals to include 15-35% energy as protein, 25-35% energy from fat, 35-60% energy from carbohydrates, less than 200mg  of dietary cholesterol, 20-35 gm of total fiber daily;Understanding of distribution of calorie intake throughout the day with the consumption of 4-5 meals/snacks    Improve shortness of breath with ADL's Yes    Intervention Provide education, individualized exercise plan and daily activity instruction to help decrease symptoms of SOB with activities of daily living.    Expected Outcomes Short Term: Improve cardiorespiratory fitness to achieve a reduction of symptoms when performing ADLs;Long Term: Be able to perform more ADLs without symptoms or delay the onset of symptoms             Core Components/Risk Factors/Patient Goals Review:    Core Components/Risk Factors/Patient Goals at Discharge (Final Review):    ITP Comments:   Comments: Dr. Mechele Collin is Medical Director for Pulmonary Rehab at [facility].

## 2022-09-24 NOTE — Progress Notes (Addendum)
Kelly Henderson 77 y.o. female Pulmonary Rehab Orientation Note This patient who was referred to Pulmonary Rehab by Dr. Thora Lance with the diagnosis of COPD stage 2 arrived today in Cardiac and Pulmonary Rehab. She  arrived ambulatory with normal gait. She  does not carry portable oxygen. Per patient, [name] uses oxygen never. Color good, skin warm and dry. Patient is oriented to time and place. Patient's medical history, psychosocial health, and medications reviewed. Psychosocial assessment reveals patient lives with spouse. Kelly Henderson is currently retired. Patient hobbies include spending time with others and listening to music. Patient reports her stress level is low. Areas of stress/anxiety include family . Patient does not exhibit signs of depression. PHQ2/9 score 0/1. Kelly Henderson shows good  coping skills with positive outlook on life. Offered emotional support and reassurance. Will continue to monitor. Physical assessment performed by Essie Hart RN. Please see their orientation physical assessment note. [name] reports she does take medications as prescribed. Patient states she follows a regular  diet. The patient has been trying to lose weight through a healthy diet and exercise program.. Patient's weight will be monitored closely. Demonstration and practice of PLB using pulse oximeter. Kelly Henderson able to return demonstration satisfactorily. Safety and hand hygiene in the exercise area reviewed with patient. Kelly Henderson voices understanding of the information reviewed. Department expectations discussed with patient and achievable goals were set. The patient shows enthusiasm about attending the program and we look forward to working with Kelly Henderson. [name] completed a 6 min walk test today and is scheduled to begin exercise on 09/30/22 at 10:15am.   [id number] Kelly San, MS, ACSM-CEP

## 2022-09-24 NOTE — Progress Notes (Signed)
   09/24/22 1600  6 Minute Walk  Phase Initial  Distance 556 feet  Walk Time 6 minutes  # of Rest Breaks 2 (stopped at 1:50 for desat, 5:27 for desat)  MPH 1.05  METS 1.28  RPE 12  Perceived Dyspnea  2  VO2 Peak 4.49  Symptoms Yes (comment)  Comments DOE  Resting HR 78 bpm  Resting BP 142/80  Resting Oxygen Saturation  90 %  Exercise Oxygen Saturation  during 6 min walk 79 %  Max Ex. HR 105 bpm  Max Ex. BP 174/98  2 Minute Post BP 142/76  Interval HR  Interval Heart Rate? Yes  1 Minute HR 79  2 Minute HR 96  3 Minute HR 83  4 Minute HR 85  5 Minute HR 105  6 Minute HR 96  2 Minute Post HR 81  Interval Oxygen  Interval Oxygen? Yes  Baseline Oxygen Saturation % 90 %  1 Minute Oxygen Saturation % 89 % (stopped at 1:50 O2 80%, applied 2L O2)  1 Minute Liters of Oxygen 0 L  2 Minute Oxygen Saturation % 79 % (stopped at 2:20 to increase O2 and pt began PLB)  2 Minute Liters of Oxygen 2 L  3 Minute Oxygen Saturation % 92 %  3 Minute Liters of Oxygen 3 L  4 Minute Oxygen Saturation % 88 % (4:21 stopped pt d/t low O2 at 84%, pt able to PLB and O2>88%)  4 Minute Liters of Oxygen 3 L  5 Minute Oxygen Saturation % 84 % (5:27 resumed walk test, O2 90% on 4L)  5 Minute Liters of Oxygen 4 L  6 Minute Oxygen Saturation % 93 %  6 Minute Liters of Oxygen 4 L  2 Minute Post Oxygen Saturation % 96 % (5 min post, 91% room air)  2 Minute Post Liters of Oxygen 4 L

## 2022-09-30 ENCOUNTER — Encounter (HOSPITAL_COMMUNITY)
Admission: RE | Admit: 2022-09-30 | Discharge: 2022-09-30 | Disposition: A | Discharge status: Home / Self Care | Payer: No Typology Code available for payment source | Source: Ambulatory Visit | Attending: Student | Admitting: Student

## 2022-09-30 DIAGNOSIS — J449 Chronic obstructive pulmonary disease, unspecified: Secondary | ICD-10-CM

## 2022-09-30 NOTE — Progress Notes (Signed)
Daily Session Note  Patient Details  Name: Kelly Henderson MRN: [id number] Date of Birth: 1945-07-20 Referring Provider:   [name] Pulmonary Rehab Walk Test from 09/24/2022 in [facility], [facility], [facility]  Referring Provider Meier       Encounter Date: 09/30/2022  Check In:  Session Check In - 09/30/22 1150       Check-In   Supervising physician immediately available to respond to emergencies [facility] MD immediately available    Physician(s) Robin Searing, NP    Location [facility]    Staff Present Samantha Belarus, RD, Dutch Gray, RN, BSN;Randi Dionisio Paschal, ACSM-CEP, Exercise Physiologist;Carlette Les Pou, RN, Doris Cheadle, MS, ACSM-CEP, Exercise Physiologist;Mahalia Dykes Katrinka Blazing, RT    Virtual Visit No    Medication changes reported     No    Fall or balance concerns reported    No    Tobacco Cessation No Change    Warm-up and Cool-down Performed as group-led instruction    Resistance Training Performed Yes    VAD Patient? No    PAD/SET Patient? No      Pain Assessment   Currently in Pain? No/denies    Multiple Pain Sites No             Capillary Blood Glucose: No results found for this or any previous visit (from the past 24 hour(s)).    Social History   Tobacco Use  Smoking Status Former   Packs/day: 2.00   Years: 50.00   Additional pack years: 0.00   Total pack years: 100.00   Types: Cigarettes   Quit date: 07/01/2016   Years since quitting: 6.2  Smokeless Tobacco Never    Goals Met:  Proper associated with RPD/PD & O2 Sat Independence with exercise equipment Exercise tolerated well No report of concerns or symptoms today Strength training completed today  Goals Unmet:  Not Applicable  Comments: Service time is from 1018 to 1200.    Dr. Mechele Collin is Medical Director for Pulmonary Rehab at [facility].

## 2022-10-05 ENCOUNTER — Encounter (HOSPITAL_COMMUNITY)
Admission: RE | Admit: 2022-10-05 | Discharge: 2022-10-05 | Disposition: A | Discharge status: Home / Self Care | Payer: No Typology Code available for payment source | Source: Ambulatory Visit | Attending: Student | Admitting: Student

## 2022-10-05 VITALS — Wt 220.5 lb

## 2022-10-05 DIAGNOSIS — J449 Chronic obstructive pulmonary disease, unspecified: Secondary | ICD-10-CM

## 2022-10-05 NOTE — Progress Notes (Signed)
Incomplete Session Note  Patient Details  Name: Kelly Henderson MRN: [id number] Date of Birth: [birth year] Referring Provider:   [name] Pulmonary Rehab Walk Test from 09/24/2022 in [facility] for [facility], Vascular, & Lung [facility]  Referring Provider Ndia Sanks Zabinski did not complete her rehab session.  Trudee Grip came to [facility], stating she was ill over the weekend. She stated that she laid in bed d/t allergies and sinus congestion. Pt stated she felt a little better but not 100%. Pt informed to go home, hydrate and come back on Thursday. Pt understands without assistance.

## 2022-10-07 ENCOUNTER — Encounter (HOSPITAL_COMMUNITY): Payer: No Typology Code available for payment source

## 2022-10-07 ENCOUNTER — Other Ambulatory Visit: Payer: Self-pay | Admitting: Family Medicine

## 2022-10-07 DIAGNOSIS — J302 Other seasonal allergic rhinitis: Secondary | ICD-10-CM

## 2022-10-08 ENCOUNTER — Encounter: Payer: Self-pay | Admitting: Family Medicine

## 2022-10-08 ENCOUNTER — Ambulatory Visit (INDEPENDENT_AMBULATORY_CARE_PROVIDER_SITE_OTHER): Payer: No Typology Code available for payment source | Admitting: Family

## 2022-10-08 ENCOUNTER — Other Ambulatory Visit (HOSPITAL_BASED_OUTPATIENT_CLINIC_OR_DEPARTMENT_OTHER): Payer: Self-pay

## 2022-10-08 VITALS — BP 122/80 | HR 86 | Temp 97.5°F | Resp 16 | Wt 218.0 lb

## 2022-10-08 DIAGNOSIS — J40 Bronchitis, not specified as acute or chronic: Secondary | ICD-10-CM | POA: Diagnosis not present

## 2022-10-08 MED ORDER — BENZONATATE 100 MG PO CAPS
100.0000 mg | ORAL_CAPSULE | Freq: Three times a day (TID) | ORAL | 0 refills | Status: DC | PRN
  Filled 2022-10-08: qty 20, 7d supply, fill #0

## 2022-10-08 MED ORDER — AZITHROMYCIN 250 MG PO TABS
ORAL_TABLET | ORAL | 0 refills | Status: AC
  Filled 2022-10-08: qty 6, 5d supply, fill #0

## 2022-10-08 NOTE — Progress Notes (Signed)
Subjective:   By signing my name below, I, Kelly Henderson, attest that this documentation has been prepared under the direction and in the presence of Kelly Fillers, NP 10/08/22   Patient ID: Kelly Henderson, female    DOB: [birth year], 77 y.o.   MRN: [id number]  Chief Complaint  Patient presents with   Cough    Patient complains of persistent, productive cough.     Cough   Patient is in today for an office visit.   Cough: She complains of a productive cough. She reports having cold-like symptoms starting Friday night. She states her cough developed throughout the week and became productive. She has concerns due to her history of COPD. She has been compliant with Albuterol and Breztri.  She reports she started going to a pulmonary therapy group on [address] in [place] last week, which helped her.   Past Medical History:  Diagnosis Date   Aortic atherosclerosis (HCC)    CT 2019   Emphysema of lung (HCC)    History of chicken pox    History of shingles    Osteoporosis     Past Surgical History:  Procedure Laterality Date   ABDOMINAL HYSTERECTOMY     AUGMENTATION MAMMAPLASTY Bilateral    Silicone, in front of muscle   BREAST BIOPSY     Patient has had 3 biopsy   BREAST ENHANCEMENT SURGERY  1975   TONSILLECTOMY      Family History  Problem Relation Age of Onset   Cancer Mother        Uterus   Alcohol abuse Father    Heart disease Father    Heart attack Son    Colon polyps Neg Hx    Colon cancer Neg Hx    Esophageal cancer Neg Hx    Rectal cancer Neg Hx    Stomach cancer Neg Hx     Social History   Socioeconomic History   Marital status: Married    Spouse name: Not on file   Number of children: Not on file   Years of education: Not on file   Highest education level: Bachelor's degree (e.g., BA, AB, BS)  Occupational History   Not on file  Tobacco Use   Smoking status: Former    Packs/day: 2.00    Years: 50.00    Additional pack years: 0.00     Total pack years: 100.00    Types: Cigarettes    Quit date: 07/01/2016    Years since quitting: 6.2   Smokeless tobacco: Never  Vaping Use   Vaping Use: Never used  Substance and Sexual Activity   Alcohol use: No   Drug use: No   Sexual activity: Yes    Birth control/protection: Surgical  Other Topics Concern   Not on file  Social History Narrative   Not on file   Social Determinants of Health   Financial Resource Strain: Low Risk  (08/23/2022)   Overall Financial Resource Strain (CARDIA)    Difficulty of Paying Living Expenses: Not hard at all  Food Insecurity: No Food Insecurity (08/23/2022)   Hunger Vital Sign    Worried About Running Out of Food in the Last Year: Never true    Ran Out of Food in the Last Year: Never true  Transportation Needs: No Transportation Needs (08/23/2022)   PRAPARE - Administrator, Civil Service (Medical): No    Lack of Transportation (Non-Medical): No  Physical Activity: Unknown (08/23/2022)   Exercise  Vital Sign    Days of Exercise per Week: Patient declined    Minutes of Exercise per Session: Not on file  Stress: No Stress Concern Present (08/23/2022)   [employer] of Occupational Health - Occupational Stress Questionnaire    Feeling of Stress : Only a little  Social Connections: Unknown (08/23/2022)   Social Connection and Isolation Panel [NHANES]    Frequency of Communication with Friends and Family: More than three times a week    Frequency of Social Gatherings with Friends and Family: Once a week    Attends Religious Services: Patient declined    Database administrator or Organizations: Yes    Attends Banker Meetings: Patient declined    Marital Status: Married  Catering manager Violence: Not At Risk (12/04/2020)   Humiliation, Afraid, Rape, and Kick questionnaire    Fear of Current or Ex-Partner: No    Emotionally Abused: No    Physically Abused: No    Sexually Abused: No    Outpatient Medications  Prior to Visit  Medication Sig Dispense Refill   albuterol (VENTOLIN HFA) 108 (90 Base) MCG/ACT inhaler TAKE 2 PUFFS BY MOUTH EVERY 6 HOURS AS NEEDED FOR WHEEZE OR SHORTNESS OF BREATH 18 g 2   Budeson-Glycopyrrol-Formoterol (BREZTRI AEROSPHERE) 160-9-4.8 MCG/ACT AERO Inhale 2 puffs into the lungs 2 (two) times daily. Rinse mouth out after use. 10.7 g 11   fluticasone (FLONASE) 50 MCG/ACT nasal spray Place 2 sprays into both nostrils daily. 16 g 6   montelukast (SINGULAIR) 10 MG tablet TAKE 1 TABLET BY MOUTH EVERYDAY AT BEDTIME 30 tablet 3   promethazine (PHENERGAN) 25 MG suppository PLACE 1 SUPPOSITORY (25 MG TOTAL) RECTALLY EVERY 8 (EIGHT) HOURS AS NEEDED FOR NAUSEA OR VOMITING. 12 suppository 0   rosuvastatin (CRESTOR) 20 MG tablet TAKE 1 TABLET BY MOUTH EVERY DAY 90 tablet 3   Vitamin D, Ergocalciferol, (DRISDOL) 1.25 MG (50000 UNIT) CAPS capsule TAKE 1 CAPSULE (50,000 UNITS TOTAL) BY MOUTH EVERY 7 (SEVEN) DAYS 12 capsule 0   No facility-administered medications prior to visit.    Allergies  Allergen Reactions   Latex Hives   Morphine And Codeine Swelling    Review of Systems  Respiratory:  Positive for cough (productive).        Objective:    Physical Exam Constitutional:      General: She is not in acute distress.    Appearance: Normal appearance. She is well-developed.  HENT:     Head: Normocephalic and atraumatic.     Right Ear: External ear normal.     Left Ear: External ear normal.  Eyes:     General: No scleral icterus. Neck:     Thyroid: No thyromegaly.  Cardiovascular:     Rate and Rhythm: Normal rate and regular rhythm.     Heart sounds: Normal heart sounds. No murmur heard. Pulmonary:     Effort: Pulmonary effort is normal. No respiratory distress.     Breath sounds: Normal breath sounds. No wheezing.  Musculoskeletal:     Cervical back: Neck supple.  Skin:    General: Skin is warm and dry.  Neurological:     Mental Status: She is alert and oriented to  person, place, and time.  Psychiatric:        Mood and Affect: Mood normal.        Behavior: Behavior normal.        Thought Content: Thought content normal.        Judgment:  Judgment normal.     BP 122/80 (BP Location: Right Arm, Patient Position: Sitting, Cuff Size: Large)   Pulse 86   Temp (!) 97.5 F (36.4 C) (Oral)   Resp 16   Wt 218 lb (98.9 kg)   SpO2 98%   BMI 34.14 kg/m  Wt Readings from Last 3 Encounters:  10/08/22 218 lb (98.9 kg)  10/05/22 220 lb 7.4 oz (100 kg)  09/24/22 223 lb 5.2 oz (101.3 kg)       Assessment & Plan:  Bronchitis Assessment & Plan: New.  Recommended mucinex as needed for chest congestion, tessalon as needed for cough and zpak to cover for bronchitis.  Pt has hx of COPD. No wheezing noted today. She continues her Breztri and albuterol.    Other orders -     Azithromycin; Take 2 tablets on day 1, then 1 tablet daily on days 2 through 5  Dispense: 6 tablet; Refill: 0 -     Benzonatate; Take 1 capsule (100 mg total) by mouth 3 (three) times daily as needed.  Dispense: 20 capsule; Refill: 0     I,Rachel Rivera,acting as a scribe for Kelly Fillers, NP.,have documented all relevant documentation on the behalf of Kelly Fillers, NP,as directed by  Kelly Fillers, NP while in the presence of Kelly Fillers, NP.   I, Kelly Fillers, NP, personally preformed the services described in this documentation.  All medical record entries made by the scribe were at my direction and in my presence.  I have reviewed the chart and discharge instructions (if applicable) and agree that the record reflects my personal performance and is accurate and complete. 10/08/22   Kelly Fillers, NP

## 2022-10-08 NOTE — Assessment & Plan Note (Signed)
New.  Recommended mucinex as needed for chest congestion, tessalon as needed for cough and zpak to cover for bronchitis.  Pt has hx of COPD. No wheezing noted today. She continues her Breztri and albuterol.

## 2022-10-11 ENCOUNTER — Other Ambulatory Visit (HOSPITAL_BASED_OUTPATIENT_CLINIC_OR_DEPARTMENT_OTHER): Payer: Self-pay

## 2022-10-11 ENCOUNTER — Other Ambulatory Visit (HOSPITAL_COMMUNITY): Payer: Self-pay

## 2022-10-12 ENCOUNTER — Encounter (HOSPITAL_COMMUNITY)
Admission: RE | Admit: 2022-10-12 | Discharge: 2022-10-12 | Disposition: A | Discharge status: Home / Self Care | Payer: No Typology Code available for payment source | Source: Ambulatory Visit | Attending: Student | Admitting: Student

## 2022-10-12 DIAGNOSIS — J449 Chronic obstructive pulmonary disease, unspecified: Secondary | ICD-10-CM | POA: Diagnosis not present

## 2022-10-12 NOTE — Progress Notes (Signed)
Daily Session Note  Patient Details  Name: Kelly Henderson MRN: [id number] Date of Birth: March 12, 1946 Referring Provider:   [name] Pulmonary Rehab Walk Test from 09/24/2022 in [facility] for Heart, Vascular, & Lung Health  Referring Provider Meier       Encounter Date: 10/12/2022  Check In:  Session Check In - 10/12/22 1051       Check-In   Supervising physician immediately available to respond to emergencies [facility] MD immediately available    Physician(s) Carlyon Shadow, NP    Location [facility]    Staff Present Samantha Belarus, RD, LDN;Randi Dionisio Paschal, ACSM-CEP, Exercise Physiologist;Carlette Les Pou, RN, Doris Cheadle, MS, ACSM-CEP, Exercise Physiologist;Casey Katrinka Blazing, RT    Virtual Visit No    Medication changes reported     No    Fall or balance concerns reported    No    Tobacco Cessation No Change    Warm-up and Cool-down Performed as group-led instruction    Resistance Training Performed Yes    VAD Patient? No    PAD/SET Patient? No      Pain Assessment   Currently in Pain? No/denies    Multiple Pain Sites No             Capillary Blood Glucose: No results found for this or any previous visit (from the past 24 hour(s)).    Social History   Tobacco Use  Smoking Status Former   Packs/day: 2.00   Years: 50.00   Additional pack years: 0.00   Total pack years: 100.00   Types: Cigarettes   Quit date: 07/01/2016   Years since quitting: 6.2  Smokeless Tobacco Never    Goals Met:  Proper associated with RPD/PD & O2 Sat Exercise tolerated well No report of concerns or symptoms today Strength training completed today  Goals Unmet:  Not Applicable  Comments: Service time is from 1011 to 1134.    Dr. Mechele Collin is Medical Director for Pulmonary Rehab at [facility].

## 2022-10-14 ENCOUNTER — Encounter (HOSPITAL_COMMUNITY)
Admission: RE | Admit: 2022-10-14 | Discharge: 2022-10-14 | Disposition: A | Discharge status: Home / Self Care | Payer: No Typology Code available for payment source | Source: Ambulatory Visit | Attending: Student | Admitting: Student

## 2022-10-14 DIAGNOSIS — J449 Chronic obstructive pulmonary disease, unspecified: Secondary | ICD-10-CM | POA: Diagnosis not present

## 2022-10-14 NOTE — Progress Notes (Signed)
Daily Session Note  Patient Details  Name: Kelly Henderson MRN: [id number] Date of Birth: Jun 15, 1945 Referring Provider:   [name] Pulmonary Rehab Walk Test from 09/24/2022 in [facility] for Heart, Vascular, & Lung Health  Referring Provider Meier       Encounter Date: 10/14/2022  Check In:  Session Check In - 10/14/22 1027       Check-In   Supervising physician immediately available to respond to emergencies [facility] immediately available    Physician(s) Jari Favre, PA    Location [facility]    Staff Present Samantha Belarus, RD, LDN;Randi Dionisio Paschal, ACSM-CEP, Exercise Physiologist;Kaylee Earlene Plater, MS, ACSM-CEP, Exercise Physiologist;Yoon Barca Katrinka Blazing, RT    Virtual Visit No    Medication changes reported     No    Fall or balance concerns reported    No    Tobacco Cessation No Change    Warm-up and Cool-down Performed as group-led instruction    Resistance Training Performed Yes    VAD Patient? No    PAD/SET Patient? No      Pain Assessment   Currently in Pain? No/denies    Multiple Pain Sites No             Capillary Blood Glucose: No results found for this or any previous visit (from the past 24 hour(s)).    Social History   Tobacco Use  Smoking Status Former   Packs/day: 2.00   Years: 50.00   Additional pack years: 0.00   Total pack years: 100.00   Types: Cigarettes   Quit date: 07/01/2016   Years since quitting: 6.2  Smokeless Tobacco Never    Goals Met:  Proper associated with RPD/PD & O2 Sat Independence with exercise equipment Exercise tolerated well No report of concerns or symptoms today Strength training completed today  Goals Unmet:  Not Applicable  Comments: Service time is from 1013 to 1150.    Dr. Mechele Collin is Medical Director for Pulmonary Rehab at [facility].

## 2022-10-17 ENCOUNTER — Other Ambulatory Visit: Payer: Self-pay | Admitting: Family Medicine

## 2022-10-19 ENCOUNTER — Encounter (HOSPITAL_COMMUNITY)
Admission: RE | Admit: 2022-10-19 | Discharge: 2022-10-19 | Disposition: A | Discharge status: Home / Self Care | Payer: No Typology Code available for payment source | Source: Ambulatory Visit | Attending: Student

## 2022-10-19 VITALS — Wt 224.2 lb

## 2022-10-19 DIAGNOSIS — J449 Chronic obstructive pulmonary disease, unspecified: Secondary | ICD-10-CM

## 2022-10-19 NOTE — Progress Notes (Signed)
Daily Session Note  Patient Details  Name: Kelly Henderson MRN: [id number] Date of Birth: [birth year] Referring Provider:   [name] Pulmonary Rehab Walk Test from 09/24/2022 in [facility] for Heart, Vascular, & Lung Health  Referring Provider Meier       Encounter Date: 10/19/2022  Check In:  Session Check In - 10/19/22 1025       Check-In   Supervising physician immediately available to respond to emergencies [facility] immediately available    Physician(s) Jari Favre, PA    [facility]    Staff Present Elissa Lovett BS, ACSM-CEP, Exercise Physiologist;Kaylee Earlene Plater, MS, ACSM-CEP, Exercise Physiologist;Casey Robina Ade, RN, MSN;Daesean Lazarz, RN, BSN    Virtual Visit No    Medication changes reported     No    Fall or balance concerns reported    No    Tobacco Cessation No Change    Warm-up and Cool-down Performed as group-led instruction    Resistance Training Performed Yes    VAD Patient? No    PAD/SET Patient? No      Pain Assessment   Currently in Pain? No/denies    Multiple Pain Sites No             Capillary Blood Glucose: No results found for this or any previous visit (from the past 24 hour(s)).   Exercise Prescription Changes - 10/19/22 1100       Response to Exercise   Blood Pressure (Admit) 124/86    Blood Pressure (Exercise) 150/82    Blood Pressure (Exit) 138/90    Heart Rate (Admit) 84 bpm    Heart Rate (Exercise) 110 bpm    Heart Rate (Exit) 85 bpm    Oxygen Saturation (Admit) 92 %   2L   Oxygen Saturation (Exercise) 91 %   6L   Oxygen Saturation (Exit) 96 %   6L   Rating of Perceived Exertion (Exercise) 13    Perceived Dyspnea (Exercise) 2    Duration Progress to 30 minutes of  aerobic without signs/symptoms of physical distress    Intensity THRR unchanged      Progression   Progression Continue to progress workloads to maintain intensity without signs/symptoms of physical  distress.      Resistance Training   Training Prescription Yes    Weight Blue Bands    Reps 10-15    Time 10 Minutes      Oxygen   Oxygen Continuous    Liters 2-6      Recumbant Bike   Level 4    Minutes 15    METs 2.9      Track   Laps 9    Minutes 15    METs 2.38      Oxygen   Maintain Oxygen Saturation 88% or higher             Social History   Tobacco Use  Smoking Status Former   Packs/day: 2.00   Years: 50.00   Additional pack years: 0.00   Total pack years: 100.00   Types: Cigarettes   Quit date: 07/01/2016   Years since quitting: 6.3  Smokeless Tobacco Never    Goals Met:  Independence with exercise equipment Exercise tolerated well No report of concerns or symptoms today Strength training completed today  Goals Unmet:  Not Applicable  Comments: Service time is from 1006 to 1130    Dr. Mechele Collin is Medical Director for Pulmonary Rehab  at [facility].

## 2022-10-20 NOTE — Progress Notes (Signed)
Pulmonary Individual Treatment Plan  Patient Details  Name: Kelly Henderson MRN: [id number] Date of Birth: 11/26/1945 Referring Provider:   [name] Pulmonary Rehab Walk Test from 09/24/2022 in [facility]  Referring Provider Meier       Initial Encounter Date:  Flowsheet Row Pulmonary Rehab Walk Test from 09/24/2022 in [facility]  Date 09/24/22       Visit Diagnosis: Stage 2 moderate COPD by GOLD classification (HCC)  Patient's Home Medications on Admission:   Current Outpatient Medications:    albuterol (VENTOLIN HFA) 108 (90 Base) MCG/ACT inhaler, TAKE 2 PUFFS BY MOUTH EVERY 6 HOURS AS NEEDED FOR WHEEZE OR SHORTNESS OF BREATH, Disp: 18 g, Rfl: 2   benzonatate (TESSALON) 100 MG capsule, Take 1 capsule (100 mg total) by mouth 3 (three) times daily as needed., Disp: 20 capsule, Rfl: 0   Budeson-Glycopyrrol-Formoterol (BREZTRI AEROSPHERE) 160-9-4.8 MCG/ACT AERO, Inhale 2 puffs into the lungs 2 (two) times daily. Rinse mouth out after use., Disp: 10.7 g, Rfl: 11   fluticasone (FLONASE) 50 MCG/ACT nasal spray, Place 2 sprays into both nostrils daily., Disp: 16 g, Rfl: 6   montelukast (SINGULAIR) 10 MG tablet, TAKE 1 TABLET BY MOUTH EVERYDAY AT BEDTIME, Disp: 30 tablet, Rfl: 3   promethazine (PHENERGAN) 25 MG suppository, PLACE 1 SUPPOSITORY (25 MG TOTAL) RECTALLY EVERY 8 (EIGHT) HOURS AS NEEDED FOR NAUSEA OR VOMITING., Disp: 12 suppository, Rfl: 0   rosuvastatin (CRESTOR) 20 MG tablet, TAKE 1 TABLET BY MOUTH EVERY DAY, Disp: 90 tablet, Rfl: 3   Vitamin D, Ergocalciferol, (DRISDOL) 1.25 MG (50000 UNIT) CAPS capsule, TAKE 1 CAPSULE (50,000 UNITS TOTAL) BY MOUTH EVERY 7 (SEVEN) DAYS, Disp: 12 capsule, Rfl: 0  Past Medical History: Past Medical History:  Diagnosis Date   Aortic atherosclerosis (HCC)    CT 2019   Emphysema of lung (HCC)    History of chicken pox     History of shingles    Osteoporosis     Tobacco Use: Social History   Tobacco Use  Smoking Status Former   Packs/day: 2.00   Years: 50.00   Additional pack years: 0.00   Total pack years: 100.00   Types: Cigarettes   Quit date: 07/01/2016   Years since quitting: 6.3  Smokeless Tobacco Never    Labs: Review Flowsheet  More data exists      Latest Ref Rng & Units 04/09/2019 03/09/2021 04/21/2021 12/28/2021 08/30/2022  Labs for ITP Cardiac and Pulmonary Rehab  Cholestrol 0 - 200 mg/dL 409  811  914  782  956   LDL (calc) 0 - 99 mg/dL 213  086  59  67  55   HDL-C >39.00 mg/dL 57.84  69.62  95.28  41.32  71.70   Trlycerides 0.0 - 149.0 mg/dL 44.0  10.2  72.5  36.6  78.0     Capillary Blood Glucose: No results found for: "GLUCAP"   Pulmonary Assessment Scores:  Pulmonary Assessment Scores     Row Name 09/24/22 1324         ADL UCSD   ADL Phase Entry     SOB Score total 26       CAT Score   CAT Score 11       mMRC Score   mMRC Score 2             UCSD: Self-administered rating of dyspnea associated with  activities of daily living (ADLs) 6-point scale (0 = "not at all" to 5 = "maximal or unable to do because of breathlessness")  Scoring Scores range from 0 to 120.  Minimally important difference is 5 units  CAT: CAT can identify the health impairment of COPD patients and is better correlated with disease progression.  CAT has a scoring range of zero to 40. The CAT score is classified into four groups of low (less than 10), medium (10 - 20), high (21-30) and very high (31-40) based on the impact level of disease on health status. A CAT score over 10 suggests significant symptoms.  A worsening CAT score could be explained by an exacerbation, poor medication adherence, poor inhaler technique, or progression of COPD or comorbid conditions.  CAT MCID is 2 points  mMRC: mMRC (Modified Medical Research Council) Dyspnea Scale is used to assess the degree of  baseline functional disability in patients of respiratory disease due to dyspnea. No minimal important difference is established. A decrease in score of 1 point or greater is considered a positive change.   Pulmonary Function Assessment:  Pulmonary Function Assessment - 09/24/22 1323       Breath   Bilateral Breath Sounds Decreased    Shortness of Breath Yes;Limiting activity             Exercise Target Goals: Exercise Program Goal: Individual exercise prescription set using results from initial 6 min walk test and THRR while considering  patient's activity barriers and safety.   Exercise Prescription Goal: Initial exercise prescription builds to 30-45 minutes a day of aerobic activity, 2-3 days per week.  Home exercise guidelines will be given to patient during program as part of exercise prescription that the participant will acknowledge.  Activity Barriers & Risk Stratification:  Activity Barriers & Cardiac Risk Stratification - 09/24/22 1331       Activity Barriers & Cardiac Risk Stratification   Activity Barriers Deconditioning;Muscular Weakness;Shortness of Breath             6 Minute Walk:  6 Minute Walk     Row Name 09/24/22 1600         6 Minute Walk   Phase Initial     Distance 556 feet     Walk Time 6 minutes     # of Rest Breaks 2  stopped at 1:50 for desat, 5:27 for desat     MPH 1.05     METS 1.28     RPE 12     Perceived Dyspnea  2     VO2 Peak 4.49     Symptoms Yes (comment)     Comments DOE     Resting HR 78 bpm     Resting BP 142/80     Resting Oxygen Saturation  90 %     Exercise Oxygen Saturation  during 6 min walk 79 %     Max Ex. HR 105 bpm     Max Ex. BP 174/98     2 Minute Post BP 142/76       Interval HR   1 Minute HR 79     2 Minute HR 96     3 Minute HR 83     4 Minute HR 85     5 Minute HR 105     6 Minute HR 96     2 Minute Post HR 81     Interval Heart Rate? Yes       Interval  Oxygen   Interval Oxygen? Yes      Baseline Oxygen Saturation % 90 %     1 Minute Oxygen Saturation % 89 %  stopped at 1:50 O2 80%, applied 2L O2     1 Minute Liters of Oxygen 0 L     2 Minute Oxygen Saturation % 79 %  stopped at 2:20 to increase O2 and pt began PLB     2 Minute Liters of Oxygen 2 L     3 Minute Oxygen Saturation % 92 %     3 Minute Liters of Oxygen 3 L     4 Minute Oxygen Saturation % 88 %  4:21 stopped pt d/t low O2 at 84%, pt able to PLB and O2>88%     4 Minute Liters of Oxygen 3 L     5 Minute Oxygen Saturation % 84 %  5:27 resumed walk test, O2 90% on 4L     5 Minute Liters of Oxygen 4 L     6 Minute Oxygen Saturation % 93 %     6 Minute Liters of Oxygen 4 L     2 Minute Post Oxygen Saturation % 96 %  5 min post, 91% room air     2 Minute Post Liters of Oxygen 4 L              Oxygen Initial Assessment:  Oxygen Initial Assessment - 09/24/22 1322       Home Oxygen   Home Oxygen Device None    Sleep Oxygen Prescription None    Home Exercise Oxygen Prescription None    Home Resting Oxygen Prescription None      Initial 6 min Walk   Oxygen Used Continuous    Liters per minute 4      Program Oxygen Prescription   Program Oxygen Prescription Continuous    Liters per minute 4      Intervention   Short Term Goals To learn and exhibit compliance with exercise, home and travel O2 prescription;To learn and understand importance of maintaining oxygen saturations>88%;To learn and demonstrate proper use of respiratory medications;To learn and understand importance of monitoring SPO2 with pulse oximeter and demonstrate accurate use of the pulse oximeter.;To learn and demonstrate proper pursed lip breathing techniques or other breathing techniques.     Long  Term Goals Exhibits compliance with exercise, home  and travel O2 prescription;Verbalizes importance of monitoring SPO2 with pulse oximeter and return demonstration;Maintenance of O2 saturations>88%;Exhibits proper breathing techniques, such as  pursed lip breathing or other method taught during program session;Compliance with respiratory medication;Other             Oxygen Re-Evaluation:  Oxygen Re-Evaluation     Row Name 10/11/22 1210             Program Oxygen Prescription   Program Oxygen Prescription Continuous       Liters per minute 4         Home Oxygen   Home Oxygen Device None       Sleep Oxygen Prescription None       Home Exercise Oxygen Prescription None       Home Resting Oxygen Prescription None         Goals/Expected Outcomes   Short Term Goals To learn and exhibit compliance with exercise, home and travel O2 prescription;To learn and understand importance of maintaining oxygen saturations>88%;To learn and demonstrate proper use of respiratory medications;To learn and understand importance of monitoring SPO2 with  pulse oximeter and demonstrate accurate use of the pulse oximeter.;To learn and demonstrate proper pursed lip breathing techniques or other breathing techniques.        Long  Term Goals Exhibits compliance with exercise, home  and travel O2 prescription;Verbalizes importance of monitoring SPO2 with pulse oximeter and return demonstration;Maintenance of O2 saturations>88%;Exhibits proper breathing techniques, such as pursed lip breathing or other method taught during program session;Compliance with respiratory medication;Other       Goals/Expected Outcomes Compliance and understanding of oxygen saturation monitoring and breathing techniques to decrease shortness of breath.                Oxygen Discharge (Final Oxygen Re-Evaluation):  Oxygen Re-Evaluation - 10/11/22 1210       Program Oxygen Prescription   Program Oxygen Prescription Continuous    Liters per minute 4      Home Oxygen   Home Oxygen Device None    Sleep Oxygen Prescription None    Home Exercise Oxygen Prescription None    Home Resting Oxygen Prescription None      Goals/Expected Outcomes   Short Term Goals To learn  and exhibit compliance with exercise, home and travel O2 prescription;To learn and understand importance of maintaining oxygen saturations>88%;To learn and demonstrate proper use of respiratory medications;To learn and understand importance of monitoring SPO2 with pulse oximeter and demonstrate accurate use of the pulse oximeter.;To learn and demonstrate proper pursed lip breathing techniques or other breathing techniques.     Long  Term Goals Exhibits compliance with exercise, home  and travel O2 prescription;Verbalizes importance of monitoring SPO2 with pulse oximeter and return demonstration;Maintenance of O2 saturations>88%;Exhibits proper breathing techniques, such as pursed lip breathing or other method taught during program session;Compliance with respiratory medication;Other    Goals/Expected Outcomes Compliance and understanding of oxygen saturation monitoring and breathing techniques to decrease shortness of breath.             Initial Exercise Prescription:  Initial Exercise Prescription - 09/24/22 1500       Date of Initial Exercise RX and Referring Provider   Date 09/24/22    Referring Provider Meier    Expected Discharge Date 12/23/22      Oxygen   Oxygen Continuous    Liters 4    Maintain Oxygen Saturation 88% or higher      Recumbant Bike   Level 1    RPM 20    Watts 40    Minutes 15    METs 2      Track   Minutes 15    METs 1.5      Prescription Details   Frequency (times per week) 2    Duration Progress to 30 minutes of continuous aerobic without signs/symptoms of physical distress      Intensity   THRR 40-80% of Max Heartrate 58-116    Ratings of Perceived Exertion 11-13    Perceived Dyspnea 0-4      Progression   Progression Continue to progress workloads to maintain intensity without signs/symptoms of physical distress.      Resistance Training   Training Prescription Yes    Weight red bands    Reps 10-15             Perform Capillary  Blood Glucose checks as needed.  Exercise Prescription Changes:   Exercise Prescription Changes     Row Name 10/05/22 1200 10/19/22 1100           Response to Exercise  Blood Pressure (Admit) 118/70 124/86      Blood Pressure (Exercise) 154/80 150/82      Blood Pressure (Exit) 112/80 138/90      Heart Rate (Admit) 90 bpm 84 bpm      Heart Rate (Exercise) 105 bpm 110 bpm      Heart Rate (Exit) 86 bpm 85 bpm      Oxygen Saturation (Admit) 90 % 92 %  2L      Oxygen Saturation (Exercise) 95 % 91 %  6L      Oxygen Saturation (Exit) 90 % 96 %  6L      Rating of Perceived Exertion (Exercise) 12 13      Perceived Dyspnea (Exercise) 1 2      Duration Progress to 30 minutes of  aerobic without signs/symptoms of physical distress Progress to 30 minutes of  aerobic without signs/symptoms of physical distress      Intensity THRR unchanged THRR unchanged        Progression   Progression Continue to progress workloads to maintain intensity without signs/symptoms of physical distress. Continue to progress workloads to maintain intensity without signs/symptoms of physical distress.        Resistance Training   Training Prescription Yes Yes      Weight red bands Blue Bands      Reps 10-15 10-15      Time 10 Minutes 10 Minutes        Oxygen   Oxygen Continuous Continuous      Liters 4 2-6        Recumbant Bike   Level 1 4      Minutes 15 15      METs 2 2.9        Track   Laps 9 9      Minutes 15 15      METs 2.38 2.38        Oxygen   Maintain Oxygen Saturation 88% or higher 88% or higher               Exercise Comments:   Exercise Goals and Review:   Exercise Goals     Row Name 09/24/22 1331 10/11/22 1205           Exercise Goals   Increase Physical Activity Yes Yes      Intervention Provide advice, education, support and counseling about physical activity/exercise needs.;Develop an individualized exercise prescription for aerobic and resistive training based on  initial evaluation findings, risk stratification, comorbidities and participant's personal goals. Provide advice, education, support and counseling about physical activity/exercise needs.;Develop an individualized exercise prescription for aerobic and resistive training based on initial evaluation findings, risk stratification, comorbidities and participant's personal goals.      Expected Outcomes Short Term: Attend rehab on a regular basis to increase amount of physical activity.;Long Term: Add in home exercise to make exercise part of routine and to increase amount of physical activity.;Long Term: Exercising regularly at least 3-5 days a week. Short Term: Attend rehab on a regular basis to increase amount of physical activity.;Long Term: Add in home exercise to make exercise part of routine and to increase amount of physical activity.;Long Term: Exercising regularly at least 3-5 days a week.      Increase Strength and Stamina Yes Yes      Intervention Provide advice, education, support and counseling about physical activity/exercise needs.;Develop an individualized exercise prescription for aerobic and resistive training based on initial evaluation findings, risk stratification, comorbidities and  participant's personal goals. Provide advice, education, support and counseling about physical activity/exercise needs.;Develop an individualized exercise prescription for aerobic and resistive training based on initial evaluation findings, risk stratification, comorbidities and participant's personal goals.      Expected Outcomes Short Term: Increase workloads from initial exercise prescription for resistance, speed, and METs.;Short Term: Perform resistance training exercises routinely during rehab and add in resistance training at home;Long Term: Improve cardiorespiratory fitness, muscular endurance and strength as measured by increased METs and functional capacity ( ) Short Term: Increase workloads from initial  exercise prescription for resistance, speed, and METs.;Short Term: Perform resistance training exercises routinely during rehab and add in resistance training at home;Long Term: Improve cardiorespiratory fitness, muscular endurance and strength as measured by increased METs and functional capacity ( )      Able to understand and use rate of perceived exertion (RPE) scale Yes Yes      Intervention Provide education and explanation on how to use RPE scale Provide education and explanation on how to use RPE scale      Expected Outcomes Short Term: Able to use RPE daily in rehab to express subjective intensity level;Long Term:  Able to use RPE to guide intensity level when exercising independently Short Term: Able to use RPE daily in rehab to express subjective intensity level;Long Term:  Able to use RPE to guide intensity level when exercising independently      Able to understand and use Dyspnea scale Yes Yes      Intervention Provide education and explanation on how to use Dyspnea scale Provide education and explanation on how to use Dyspnea scale      Expected Outcomes Short Term: Able to use Dyspnea scale daily in rehab to express subjective sense of shortness of breath during exertion;Long Term: Able to use Dyspnea scale to guide intensity level when exercising independently Short Term: Able to use Dyspnea scale daily in rehab to express subjective sense of shortness of breath during exertion;Long Term: Able to use Dyspnea scale to guide intensity level when exercising independently      Knowledge and understanding of Target Heart Rate Range (THRR) Yes Yes      Intervention Provide education and explanation of THRR including how the numbers were predicted and where they are located for reference Provide education and explanation of THRR including how the numbers were predicted and where they are located for reference      Expected Outcomes Short Term: Able to state/look up THRR;Long Term: Able to use  THRR to govern intensity when exercising independently;Short Term: Able to use daily as guideline for intensity in rehab Short Term: Able to state/look up THRR;Long Term: Able to use THRR to govern intensity when exercising independently;Short Term: Able to use daily as guideline for intensity in rehab      Understanding of Exercise Prescription Yes Yes      Intervention Provide education, explanation, and written materials on patient's individual exercise prescription Provide education, explanation, and written materials on patient's individual exercise prescription      Expected Outcomes Short Term: Able to explain program exercise prescription;Long Term: Able to explain home exercise prescription to exercise independently Short Term: Able to explain program exercise prescription;Long Term: Able to explain home exercise prescription to exercise independently               Exercise Goals Re-Evaluation :  Exercise Goals Re-Evaluation     Row Name 10/11/22 1205  Exercise Goal Re-Evaluation   Exercise Goals Review Increase Physical Activity;Able to understand and use Dyspnea scale;Understanding of Exercise Prescription;Increase Strength and Stamina;Knowledge and understanding of Target Heart Rate Range (THRR);Able to understand and use rate of perceived exertion (RPE) scale       Comments Trudee Grip has completed 1 exercise session. She exercises for 15 min on the track and recumbent bike. Pt averaged 2.38 METs on the track and 2 METs at 1 on the recumbent bike. She performs the warmup and cooldown standing without limitations. It is too soon to notate any discernable progressions. Will continue to monitor and progress as able.       Expected Outcomes Through exercise at rehab and home, the patient will decrease shortness of breath with daily activities and feel confident carrying out an exercise regimen at home.                Discharge Exercise Prescription (Final Exercise  Prescription Changes):  Exercise Prescription Changes - 10/19/22 1100       Response to Exercise   Blood Pressure (Admit) 124/86    Blood Pressure (Exercise) 150/82    Blood Pressure (Exit) 138/90    Heart Rate (Admit) 84 bpm    Heart Rate (Exercise) 110 bpm    Heart Rate (Exit) 85 bpm    Oxygen Saturation (Admit) 92 %   2L   Oxygen Saturation (Exercise) 91 %   6L   Oxygen Saturation (Exit) 96 %   6L   Rating of Perceived Exertion (Exercise) 13    Perceived Dyspnea (Exercise) 2    Duration Progress to 30 minutes of  aerobic without signs/symptoms of physical distress    Intensity THRR unchanged      Progression   Progression Continue to progress workloads to maintain intensity without signs/symptoms of physical distress.      Resistance Training   Training Prescription Yes    Weight Blue Bands    Reps 10-15    Time 10 Minutes      Oxygen   Oxygen Continuous    Liters 2-6      Recumbant Bike   Level 4    Minutes 15    METs 2.9      Track   Laps 9    Minutes 15    METs 2.38      Oxygen   Maintain Oxygen Saturation 88% or higher             Nutrition:  Target Goals: Understanding of nutrition guidelines, daily intake of sodium 1500mg , cholesterol 200mg , calories 30% from fat and 7% or less from saturated fats, daily to have 5 or more servings of fruits and vegetables.  Biometrics:   Post Biometrics - 09/24/22 1259        Post  Biometrics   Grip Strength 22 kg             Nutrition Therapy Plan and Nutrition Goals:  Nutrition Therapy & Goals - 09/30/22 1150       Nutrition Therapy   Diet Heart healthy diet    Drug/Food Interactions Statins/Certain Fruits      Personal Nutrition Goals   Nutrition Goal Patient to improve diet quality by using the plate method as a guide for meal planning to include lean protein/plant protein, fruits, vegetables, whole grains, nonfat dairy as part of a well-balanced diet.    Personal Goal #2 Patient to identify  strategies for weight loss of 0.5-2.0# per week.  Comments Trudee Grip reports motivation to lose weight; she reports weight gain of >50# over the last year. She acknowedges increased stress eating and over snacking. We did discuss some plans for weight loss including [facility], Weight Watchers, etc. She lives at home with her husband and reports eating 1-2 meals per day; she primarily does the grocery shopping and cooking. Trudee Grip will benefit from participation in pulmonary rehab for nutrition, exercise, and lifestyle modification.      Intervention Plan   Intervention Prescribe, educate and counsel regarding individualized specific dietary modifications aiming towards targeted core components such as weight, hypertension, lipid management, diabetes, heart failure and other comorbidities.;Nutrition handout(s) given to patient.    Expected Outcomes Short Term Goal: Understand basic principles of dietary content, such as calories, fat, sodium, cholesterol and nutrients.;Long Term Goal: Adherence to prescribed nutrition plan.             Nutrition Assessments:  MEDIFICTS Score Key: ?70 Need to make dietary changes  40-70 Heart Healthy Diet ? 40 Therapeutic Level Cholesterol Diet   Picture Your Plate Scores: <16 Unhealthy dietary pattern with much room for improvement. 41-50 Dietary pattern unlikely to meet recommendations for good health and room for improvement. 51-60 More healthful dietary pattern, with some room for improvement.  >60 Healthy dietary pattern, although there may be some specific behaviors that could be improved.    Nutrition Goals Re-Evaluation:  Nutrition Goals Re-Evaluation     Row Name 09/30/22 1150             Goals   Current Weight 222 lb 3.6 oz (100.8 kg)       Comment lipids WNL, CMP WNL       Expected Outcome Bea Joyce Gross reports motivation to lose weight; she reports weight gain of >50# over the last year. She acknowedges increased stress  eating and over snacking. We did discuss some plans for weight loss including [facility], Weight Watchers, etc. She lives at home with her husband and reports eating 1-2 meals per day; she primarily does the grocery shopping and cooking. Trudee Grip will benefit from participation in pulmonary rehab for nutrition, exercise, and lifestyle modification.                Nutrition Goals Discharge (Final Nutrition Goals Re-Evaluation):  Nutrition Goals Re-Evaluation - 09/30/22 1150       Goals   Current Weight 222 lb 3.6 oz (100.8 kg)    Comment lipids WNL, CMP WNL    Expected Outcome Bea Joyce Gross reports motivation to lose weight; she reports weight gain of >50# over the last year. She acknowedges increased stress eating and over snacking. We did discuss some plans for weight loss including [facility], Weight Watchers, etc. She lives at home with her husband and reports eating 1-2 meals per day; she primarily does the grocery shopping and cooking. Trudee Grip will benefit from participation in pulmonary rehab for nutrition, exercise, and lifestyle modification.             Psychosocial: Target Goals: Acknowledge presence or absence of significant depression and/or stress, maximize coping skills, provide positive support system. Participant is able to verbalize types and ability to use techniques and skills needed for reducing stress and depression.  Initial Review & Psychosocial Screening:  Initial Psych Review & Screening - 09/24/22 1312       Initial Review   Current issues with Current Anxiety/Panic  Comments Pt admits to anxiety but denies need for counseling and/or medication.      Family Dynamics   Good Support System? Yes    Comments husband, daughter, 5 sisters      Barriers   Psychosocial barriers to participate in program There are no identifiable barriers or psychosocial needs.             Quality of Life Scores:  Scores of 19 and below  usually indicate a poorer quality of life in these areas.  A difference of  2-3 points is a clinically meaningful difference.  A difference of 2-3 points in the total score of the Quality of Life Index has been associated with significant improvement in overall quality of life, self-image, physical symptoms, and general health in studies assessing change in quality of life.  PHQ-9: Review Flowsheet  More data exists      09/24/2022 12/28/2021 12/10/2021 12/04/2020 11/29/2019  Depression screen PHQ 2/9  Decreased Interest 0 0 0 0 0  Down, Depressed, Hopeless 0 0 0 0 0  PHQ - 2 Score 0 0 0 0 0  Altered sleeping 0 0 - - -  Tired, decreased energy 1 0 - - -  Change in appetite 0 0 - - -  Feeling bad or failure about yourself  0 0 - - -  Trouble concentrating 0 0 - - -  Moving slowly or fidgety/restless 0 0 - - -  Suicidal thoughts 0 0 - - -  PHQ-9 Score 1 0 - - -  Difficult doing work/chores Not difficult at all Not difficult at all - - -   Interpretation of Total Score  Total Score Depression Severity:  1-4 = Minimal depression, 5-9 = Mild depression, 10-14 = Moderate depression, 15-19 = Moderately severe depression, 20-27 = Severe depression   Psychosocial Evaluation and Intervention:  Psychosocial Evaluation - 09/24/22 1314       Psychosocial Evaluation & Interventions   Interventions Stress management education;Relaxation education;Encouraged to exercise with the program and follow exercise prescription    Comments Trudee Grip admits to anxiety but denies the need for counselor and/or medication.    Expected Outcomes For Trudee Grip to participate in rehab free of psychosocial barriers    Continue Psychosocial Services  Follow up required by counselor             Psychosocial Re-Evaluation:  Psychosocial Re-Evaluation     Row Name 10/18/22 0921             Psychosocial Re-Evaluation   Current issues with Current Anxiety/Panic       Comments Trudee Grip still struggles with anxiety,  but denies referral to therapist at this time. Staff will educate pt. on ways to deal with stress in a healthy way.       Expected Outcomes For Trudee Grip to participate in PR free of any psychoscial barriers or concerns.       Interventions Stress management education;Relaxation education;Encouraged to attend Pulmonary Rehabilitation for the exercise       Continue Psychosocial Services  No Follow up required                Psychosocial Discharge (Final Psychosocial Re-Evaluation):  Psychosocial Re-Evaluation - 10/18/22 0921       Psychosocial Re-Evaluation   Current issues with Current Anxiety/Panic    Comments Trudee Grip still struggles with anxiety, but denies referral to therapist at this time. Staff will educate pt. on ways to deal with stress  in a healthy way.    Expected Outcomes For Trudee Grip to participate in PR free of any psychoscial barriers or concerns.    Interventions Stress management education;Relaxation education;Encouraged to attend Pulmonary Rehabilitation for the exercise    Continue Psychosocial Services  No Follow up required             Education: Education Goals: Education classes will be provided on a weekly basis, covering required topics. Participant will state understanding/return demonstration of topics presented.  Learning Barriers/Preferences:  Learning Barriers/Preferences - 09/24/22 1316       Learning Barriers/Preferences   Learning Barriers Sight   wears glasses   Learning Preferences None             Education Topics: Introduction to Pulmonary Rehab Group instruction provided by PowerPoint, verbal discussion, and written material to support subject matter. Instructor reviews what Pulmonary Rehab is, the purpose of the program, and how patients are referred.     Know Your Numbers Group instruction that is supported by a PowerPoint presentation. Instructor discusses importance of knowing and understanding resting, exercise, and  post-exercise oxygen saturation, heart rate, and blood pressure. Oxygen saturation, heart rate, blood pressure, rating of perceived exertion, and dyspnea are reviewed along with a normal range for these values.    Exercise for the Pulmonary Patient Group instruction that is supported by a PowerPoint presentation. Instructor discusses benefits of exercise, core components of exercise, frequency, duration, and intensity of an exercise routine, importance of utilizing pulse oximetry during exercise, safety while exercising, and options of places to exercise outside of rehab.       MET Level  Group instruction provided by PowerPoint, verbal discussion, and written material to support subject matter. Instructor reviews what METs are and how to increase METs.  Flowsheet Row PULMONARY REHAB CHRONIC OBSTRUCTIVE PULMONARY DISEASE from 10/14/2022 in [facility] for [facility], Vascular, & Lung Health  Date 10/14/22  Educator EP  Instruction Review Code 1- Verbalizes Understanding       Pulmonary Medications Verbally interactive group education provided by instructor with focus on inhaled medications and proper administration.   Anatomy and Physiology of the Respiratory System Group instruction provided by PowerPoint, verbal discussion, and written material to support subject matter. Instructor reviews respiratory cycle and anatomical components of the respiratory system and their functions. Instructor also reviews differences in obstructive and restrictive respiratory diseases with examples of each.    Oxygen Safety Group instruction provided by PowerPoint, verbal discussion, and written material to support subject matter. There is an overview of "What is Oxygen" and "Why do we need it".  Instructor also reviews how to create a safe environment for oxygen use, the importance of using oxygen as prescribed, and the risks of noncompliance. There is a brief discussion on traveling  with oxygen and resources the patient may utilize.   Oxygen Use Group instruction provided by PowerPoint, verbal discussion, and written material to discuss how supplemental oxygen is prescribed and different types of oxygen supply systems. Resources for more information are provided.    Breathing Techniques Group instruction that is supported by demonstration and informational handouts. Instructor discusses the benefits of pursed lip and diaphragmatic breathing and detailed demonstration on how to perform both.     Risk Factor Reduction Group instruction that is supported by a PowerPoint presentation. Instructor discusses the definition of a risk factor, different risk factors for pulmonary disease, and how the heart and lungs work together. Flowsheet Row PULMONARY REHAB CHRONIC  OBSTRUCTIVE PULMONARY DISEASE from 09/30/2022 in [facility] for Heart, Vascular, & Lung Health  Date 09/30/22  Educator EP  Instruction Review Code 1- Verbalizes Understanding       MD Day A group question and answer session with a medical doctor that allows participants to ask questions that relate to their pulmonary disease state.   Nutrition for the Pulmonary Patient Group instruction provided by PowerPoint slides, verbal discussion, and written materials to support subject matter. The instructor gives an explanation and review of healthy diet recommendations, which includes a discussion on weight management, recommendations for fruit and vegetable consumption, as well as protein, fluid, caffeine, fiber, sodium, sugar, and alcohol. Tips for eating when patients are short of breath are discussed.    Other Education Group or individual verbal, written, or video instructions that support the educational goals of the pulmonary rehab program.    Knowledge Questionnaire Score:  Knowledge Questionnaire Score - 09/24/22 1519       Knowledge Questionnaire Score   Pre Score 17/18              Core Components/Risk Factors/Patient Goals at Admission:  Personal Goals and Risk Factors at Admission - 09/24/22 1316       Core Components/Risk Factors/Patient Goals on Admission    Weight Management Yes;Weight Loss    Intervention Weight Management: Develop a combined nutrition and exercise program designed to reach desired caloric intake, while maintaining appropriate intake of nutrient and fiber, sodium and fats, and appropriate energy expenditure required for the weight goal.;Weight Management: Provide education and appropriate resources to help participant work on and attain dietary goals.;Weight Management/Obesity: Establish reasonable short term and long term weight goals.;Obesity: Provide education and appropriate resources to help participant work on and attain dietary goals.    Expected Outcomes Short Term: Continue to assess and modify interventions until short term weight is achieved;Long Term: Adherence to nutrition and physical activity/exercise program aimed toward attainment of established weight goal;Weight Maintenance: Understanding of the daily nutrition guidelines, which includes 25-35% calories from fat, 7% or less cal from saturated fats, less than 200mg  cholesterol, less than 1.5gm of sodium, & 5 or more servings of fruits and vegetables daily;Weight Loss: Understanding of general recommendations for a balanced deficit meal plan, which promotes 1-2 lb weight loss per week and includes a negative energy balance of [phone] kcal/d;Understanding recommendations for meals to include 15-35% energy as protein, 25-35% energy from fat, 35-60% energy from carbohydrates, less than 200mg  of dietary cholesterol, 20-35 gm of total fiber daily;Understanding of distribution of calorie intake throughout the day with the consumption of 4-5 meals/snacks    Improve shortness of breath with ADL's Yes    Intervention Provide education, individualized exercise plan and daily activity  instruction to help decrease symptoms of SOB with activities of daily living.    Expected Outcomes Short Term: Improve cardiorespiratory fitness to achieve a reduction of symptoms when performing ADLs;Long Term: Be able to perform more ADLs without symptoms or delay the onset of symptoms             Core Components/Risk Factors/Patient Goals Review:   Goals and Risk Factor Review     Row Name 10/18/22 0924             Core Components/Risk Factors/Patient Goals Review   Personal Goals Review Weight Management/Obesity;Improve shortness of breath with ADL's;Stress       Review Trudee Grip has attended 4 sessions so far. She has not made any  progress with weight loss yet, but she is working with staff dietician to achieve this goal. Trudee Grip still has shortness of breath with ADL's.This could be partly due to her needing oxygen at home, but refusing to have it ordered. Trudee Grip is dealing with stress over her declining health. Staff will work with Trudee Grip on healthy ways to decrease her stress. Pt. will benefit from participation in PR for nutrition, exercise and lifestyle modification.       Expected Outcomes See admission goals                Core Components/Risk Factors/Patient Goals at Discharge (Final Review):   Goals and Risk Factor Review - 10/18/22 0924       Core Components/Risk Factors/Patient Goals Review   Personal Goals Review Weight Management/Obesity;Improve shortness of breath with ADL's;Stress    Review Trudee Grip has attended 4 sessions so far. She has not made any progress with weight loss yet, but she is working with staff dietician to achieve this goal. Trudee Grip still has shortness of breath with ADL's.This could be partly due to her needing oxygen at home, but refusing to have it ordered. Trudee Grip is dealing with stress over her declining health. Staff will work with Trudee Grip on healthy ways to decrease her stress. Pt. will benefit from participation in PR for nutrition,  exercise and lifestyle modification.    Expected Outcomes See admission goals             ITP Comments:Pt is making expected progress toward Pulmonary Rehab goals after completing 5 sessions. Recommend continued exercise, life style modification, education, and utilization of breathing techniques to increase stamina and strength, while also decreasing shortness of breath with exertion.  Dr. Mechele Collin is Medical Director for Pulmonary Rehab at [facility].

## 2022-10-21 ENCOUNTER — Encounter (HOSPITAL_COMMUNITY)
Admission: RE | Admit: 2022-10-21 | Discharge: 2022-10-21 | Disposition: A | Discharge status: Home / Self Care | Payer: No Typology Code available for payment source | Source: Ambulatory Visit | Attending: Student | Admitting: Student

## 2022-10-21 DIAGNOSIS — J449 Chronic obstructive pulmonary disease, unspecified: Secondary | ICD-10-CM

## 2022-10-21 NOTE — Progress Notes (Signed)
Daily Session Note  Patient Details  Name: Kelly Henderson MRN: [id number] Date of Birth: 03-21-1946 Referring Provider:   [name] Pulmonary Rehab Walk Test from 09/24/2022 in [facility] for Heart, Vascular, & Lung Health  Referring Provider Meier       Encounter Date: 10/21/2022  Check In:  Session Check In - 10/21/22 1030       Check-In   Supervising physician immediately available to respond to emergencies [facility] MD immediately available    Physician(s) Eligha Bridegroom, NP    Location [facility]    Staff Present Elissa Lovett BS, ACSM-CEP, Exercise Physiologist;Casey Synthia Innocent, RN, BSN    Virtual Visit No    Medication changes reported     No    Fall or balance concerns reported    No    Tobacco Cessation No Change    Warm-up and Cool-down Performed as group-led instruction    Resistance Training Performed Yes    VAD Patient? No    PAD/SET Patient? No      Pain Assessment   Currently in Pain? No/denies    Multiple Pain Sites No             Capillary Blood Glucose: No results found for this or any previous visit (from the past 24 hour(s)).    Social History   Tobacco Use  Smoking Status Former   Packs/day: 2.00   Years: 50.00   Additional pack years: 0.00   Total pack years: 100.00   Types: Cigarettes   Quit date: 07/01/2016   Years since quitting: 6.3  Smokeless Tobacco Never    Goals Met:  Independence with exercise equipment Exercise tolerated well No report of concerns or symptoms today Strength training completed today  Goals Unmet:  Not Applicable  Comments: Service time is from 1014 to 1136    Dr. Mechele Collin is Medical Director for Pulmonary Rehab at [facility].

## 2022-10-26 ENCOUNTER — Encounter (HOSPITAL_COMMUNITY)
Admission: RE | Admit: 2022-10-26 | Discharge: 2022-10-26 | Disposition: A | Discharge status: Home / Self Care | Payer: No Typology Code available for payment source | Source: Ambulatory Visit | Attending: Student | Admitting: Student

## 2022-10-26 DIAGNOSIS — J449 Chronic obstructive pulmonary disease, unspecified: Secondary | ICD-10-CM

## 2022-10-26 NOTE — Progress Notes (Signed)
Daily Session Note  Patient Details  Name: GENARA TURCOTTE MRN: [id number] Date of Birth: 1946-03-29 Referring Provider:   [name] Pulmonary Rehab Walk Test from 09/24/2022 in [facility] for Heart, Vascular, & Lung Health  Referring Provider Meier       Encounter Date: 10/26/2022  Check In:  Session Check In - 10/26/22 1025       Check-In   Supervising physician immediately available to respond to emergencies [facility] MD immediately available    Physician(s) Bernadene Person, NP    Location [facility]    Staff Present Elissa Lovett BS, ACSM-CEP, Exercise Physiologist;Tarae Wooden Synthia Innocent, RN, Doris Cheadle, MS, ACSM-CEP, Exercise Physiologist    Virtual Visit No    Medication changes reported     No    Fall or balance concerns reported    No    Tobacco Cessation No Change    Warm-up and Cool-down Performed as group-led instruction    Resistance Training Performed Yes    VAD Patient? No    PAD/SET Patient? No      Pain Assessment   Currently in Pain? No/denies    Multiple Pain Sites No             Capillary Blood Glucose: No results found for this or any previous visit (from the past 24 hour(s)).    Social History   Tobacco Use  Smoking Status Former   Packs/day: 2.00   Years: 50.00   Additional pack years: 0.00   Total pack years: 100.00   Types: Cigarettes   Quit date: 07/01/2016   Years since quitting: 6.3  Smokeless Tobacco Never    Goals Met:  Proper associated with RPD/PD & O2 Sat Independence with exercise equipment Exercise tolerated well No report of concerns or symptoms today Strength training completed today  Goals Unmet:  Not Applicable  Comments: Service time is from 1011 to 1140.    Dr. Mechele Collin is Medical Director for Pulmonary Rehab at [facility].

## 2022-10-28 ENCOUNTER — Encounter (HOSPITAL_COMMUNITY)
Admission: RE | Admit: 2022-10-28 | Discharge: 2022-10-28 | Disposition: A | Discharge status: Home / Self Care | Payer: No Typology Code available for payment source | Source: Ambulatory Visit | Attending: Student | Admitting: Student

## 2022-10-28 DIAGNOSIS — J449 Chronic obstructive pulmonary disease, unspecified: Secondary | ICD-10-CM | POA: Diagnosis not present

## 2022-10-28 NOTE — Progress Notes (Signed)
Daily Session Note  Patient Details  Name: Kelly Henderson MRN: [id number] Date of Birth: [birth year] Referring Provider:   [name] Pulmonary Rehab Walk Test from 09/24/2022 in [facility] for Heart, Vascular, & Lung Health  Referring Provider Meier       Encounter Date: 10/28/2022  Check In:  Session Check In - 10/28/22 1113       Check-In   Supervising physician immediately available to respond to emergencies [facility] MD immediately available    Physician(s) Edd Fabian, NP    Location [facility]    Staff Present Durel Salts, Patriciaann Clan, RN, Doris Cheadle, MS, ACSM-CEP, Exercise Physiologist    Virtual Visit No    Medication changes reported     No    Fall or balance concerns reported    No    Tobacco Cessation No Change    Warm-up and Cool-down Performed as group-led instruction    Resistance Training Performed Yes    VAD Patient? No    PAD/SET Patient? No      Pain Assessment   Currently in Pain? No/denies    Multiple Pain Sites No             Capillary Blood Glucose: No results found for this or any previous visit (from the past 24 hour(s)).    Social History   Tobacco Use  Smoking Status Former   Packs/day: 2.00   Years: 50.00   Additional pack years: 0.00   Total pack years: 100.00   Types: Cigarettes   Quit date: 07/01/2016   Years since quitting: 6.3  Smokeless Tobacco Never    Goals Met:    Goals Unmet:  Not Applicable  Comments: Pt only exercised one session due to foot pain. Able to exercise on the recumbent bike. Service time is from 1012 to 1145.    Dr. Mechele Collin is Medical Director for Pulmonary Rehab at [facility].

## 2022-11-02 ENCOUNTER — Encounter (HOSPITAL_COMMUNITY)
Admission: RE | Admit: 2022-11-02 | Discharge: 2022-11-02 | Disposition: A | Discharge status: Home / Self Care | Payer: No Typology Code available for payment source | Source: Ambulatory Visit | Attending: Student | Admitting: Student

## 2022-11-02 VITALS — Wt 220.2 lb

## 2022-11-02 DIAGNOSIS — J449 Chronic obstructive pulmonary disease, unspecified: Secondary | ICD-10-CM | POA: Diagnosis not present

## 2022-11-02 NOTE — Progress Notes (Signed)
Daily Session Note  Patient Details  Name: Kelly Henderson MRN: [id number] Date of Birth: June 15, 1945 Referring Provider:   [name] Pulmonary Rehab Walk Test from 09/24/2022 in [facility] for Heart, Vascular, & Lung Health  Referring Provider Meier       Encounter Date: 11/02/2022  Check In:  Session Check In - 11/02/22 1029       Check-In   Supervising physician immediately available to respond to emergencies [facility] MD immediately available    Physician(s) Edd Fabian, NP    [facility]    Staff Present Samantha Belarus, RD, Dutch Gray, RN, BSN;Randi Reeve BS, ACSM-CEP, Exercise Physiologist;Kaylee Earlene Plater, MS, ACSM-CEP, Exercise Physiologist;Khristian Phillippi Katrinka Blazing, RT    Virtual Visit No    Medication changes reported     No    Fall or balance concerns reported    No    Tobacco Cessation No Change    Warm-up and Cool-down Performed as group-led instruction    Resistance Training Performed Yes    VAD Patient? No    PAD/SET Patient? No      Pain Assessment   Currently in Pain? No/denies    Multiple Pain Sites No             Capillary Blood Glucose: No results found for this or any previous visit (from the past 24 hour(s)).   Exercise Prescription Changes - 11/02/22 1100       Response to Exercise   Blood Pressure (Admit) 112/58    Blood Pressure (Exercise) 128/70    Blood Pressure (Exit) 110/68    Heart Rate (Admit) 93 bpm    Heart Rate (Exercise) 98 bpm    Heart Rate (Exit) 89 bpm    Oxygen Saturation (Admit) 91 %    Oxygen Saturation (Exercise) 94 %    Oxygen Saturation (Exit) 93 %    Rating of Perceived Exertion (Exercise) 10    Perceived Dyspnea (Exercise) 2    Duration Progress to 30 minutes of  aerobic without signs/symptoms of physical distress    Intensity THRR unchanged      Progression   Progression Continue to progress workloads to maintain intensity without signs/symptoms of physical distress.       Resistance Training   Training Prescription Yes    Weight Blue Bands    Reps 10-15    Time 10 Minutes      Oxygen   Oxygen Continuous    Liters 4-6      Recumbant Bike   Level 4    Minutes 15    METs 3.1      Oxygen   Maintain Oxygen Saturation 88% or higher             Social History   Tobacco Use  Smoking Status Former   Packs/day: 2.00   Years: 50.00   Additional pack years: 0.00   Total pack years: 100.00   Types: Cigarettes   Quit date: 07/01/2016   Years since quitting: 6.3  Smokeless Tobacco Never    Goals Met:  Proper associated with RPD/PD & O2 Sat Independence with exercise equipment Exercise tolerated well No report of concerns or symptoms today Strength training completed today  Goals Unmet:  Not Applicable  Comments: Service time is from 1220 to 1141.    Dr. Mechele Collin is Medical Director for Pulmonary Rehab at [facility].

## 2022-11-04 ENCOUNTER — Encounter (HOSPITAL_COMMUNITY)
Admission: RE | Admit: 2022-11-04 | Discharge: 2022-11-04 | Disposition: A | Discharge status: Home / Self Care | Payer: No Typology Code available for payment source | Source: Ambulatory Visit | Attending: Student | Admitting: Student

## 2022-11-04 DIAGNOSIS — J449 Chronic obstructive pulmonary disease, unspecified: Secondary | ICD-10-CM

## 2022-11-04 NOTE — Progress Notes (Signed)
Daily Session Note  Patient Details  Name: Kelly Henderson MRN: [id number] Date of Birth: December 13, 1945 Referring Provider:   [name] Pulmonary Rehab Walk Test from 09/24/2022 in [facility] for Heart, Vascular, & Lung Health  Referring Provider Meier       Encounter Date: 11/04/2022  Check In:  Session Check In - 11/04/22 1029       Check-In   Supervising physician immediately available to respond to emergencies [facility] MD immediately available    Physician(s) Robin Searing, NP    Location [facility]    Staff Present Samantha Belarus, RD, Dutch Gray, RN, BSN;Randi Idelle Crouch BS, ACSM-CEP, Exercise Physiologist;Shadeed Colberg Earlene Plater, MS, ACSM-CEP, Exercise Physiologist;Casey Katrinka Blazing, RT    Virtual Visit No    Medication changes reported     No    Fall or balance concerns reported    No    Tobacco Cessation No Change    Warm-up and Cool-down Performed as group-led instruction    Resistance Training Performed Yes    VAD Patient? No    PAD/SET Patient? No      Pain Assessment   Currently in Pain? No/denies    Multiple Pain Sites No             Capillary Blood Glucose: No results found for this or any previous visit (from the past 24 hour(s)).    Social History   Tobacco Use  Smoking Status Former   Packs/day: 2.00   Years: 50.00   Additional pack years: 0.00   Total pack years: 100.00   Types: Cigarettes   Quit date: 07/01/2016   Years since quitting: 6.3  Smokeless Tobacco Never    Goals Met:  Proper associated with RPD/PD & O2 Sat Exercise tolerated well No report of concerns or symptoms today Strength training completed today  Goals Unmet:  Not Applicable  Comments: Service time is from 1020 to 1158.    Dr. Mechele Collin is Medical Director for Pulmonary Rehab at [facility].

## 2022-11-09 ENCOUNTER — Encounter (HOSPITAL_COMMUNITY)
Admission: RE | Admit: 2022-11-09 | Discharge: 2022-11-09 | Disposition: A | Discharge status: Home / Self Care | Payer: No Typology Code available for payment source | Source: Ambulatory Visit | Attending: Student | Admitting: Student

## 2022-11-09 DIAGNOSIS — J449 Chronic obstructive pulmonary disease, unspecified: Secondary | ICD-10-CM | POA: Diagnosis not present

## 2022-11-09 NOTE — Progress Notes (Signed)
Daily Session Note  Patient Details  Name: Kelly Henderson MRN: [id number] Date of Birth: 1946-03-07 Referring Provider:   [name] Pulmonary Rehab Walk Test from 09/24/2022 in [facility]  Referring Provider Meier       Encounter Date: 11/09/2022  Check In:  Session Check In - 11/09/22 1122       Check-In   Supervising physician immediately available to respond to emergencies [facility] MD immediately available    Physician(s) Robin Searing, NP    Location [facility]    Staff Present Samantha Belarus, RD, Dutch Gray, RN, BSN;Randi Reeve BS, ACSM-CEP, Exercise Physiologist;Leanda Padmore Earlene Plater, MS, ACSM-CEP, Exercise Physiologist    Virtual Visit No    Medication changes reported     No    Fall or balance concerns reported    No    Tobacco Cessation No Change    Warm-up and Cool-down Performed as group-led instruction    Resistance Training Performed Yes    VAD Patient? No    PAD/SET Patient? No      Pain Assessment   Currently in Pain? No/denies             Capillary Blood Glucose: No results found for this or any previous visit (from the past 24 hour(s)).    Social History   Tobacco Use  Smoking Status Former   Packs/day: 2.00   Years: 50.00   Additional pack years: 0.00   Total pack years: 100.00   Types: Cigarettes   Quit date: 07/01/2016   Years since quitting: 6.3  Smokeless Tobacco Never    Goals Met:  Proper associated with RPD/PD & O2 Sat Independence with exercise equipment Exercise tolerated well No report of concerns or symptoms today Strength training completed today  Goals Unmet:  Not Applicable  Comments: Service time is from 1018 to 1147.    Dr. Mechele Collin is Medical Director for Pulmonary Rehab at [facility].

## 2022-11-16 ENCOUNTER — Encounter (HOSPITAL_COMMUNITY)
Admission: RE | Admit: 2022-11-16 | Discharge: 2022-11-16 | Disposition: A | Discharge status: Home / Self Care | Payer: No Typology Code available for payment source | Source: Ambulatory Visit | Attending: Student | Admitting: Student

## 2022-11-16 VITALS — Wt 217.8 lb

## 2022-11-16 DIAGNOSIS — J449 Chronic obstructive pulmonary disease, unspecified: Secondary | ICD-10-CM | POA: Diagnosis not present

## 2022-11-16 NOTE — Progress Notes (Signed)
Daily Session Note  Patient Details  Name: Kelly Henderson MRN: [id number] Date of Birth: [birth year] Referring Provider:   [name] Pulmonary Rehab Walk Test from 09/24/2022 in [facility]  Referring Provider Meier       Encounter Date: 11/16/2022  Check In:  Session Check In - 11/16/22 1056       Check-In   Supervising physician immediately available to respond to emergencies [facility] MD immediately available    Physician(s) Carlyon Shadow, NP    Location [facility]    Staff Present Samantha Belarus, RD, Dutch Gray, RN, BSN;Randi Reeve BS, ACSM-CEP, Exercise Physiologist;Kaylee Earlene Plater, MS, ACSM-CEP, Exercise Physiologist;Sathvika Ojo Katrinka Blazing, RT    Virtual Visit No    Medication changes reported     No    Fall or balance concerns reported    No    Tobacco Cessation No Change    Warm-up and Cool-down Performed as group-led instruction    Resistance Training Performed Yes    VAD Patient? No    PAD/SET Patient? No      Pain Assessment   Currently in Pain? No/denies    Multiple Pain Sites No             Capillary Blood Glucose: No results found for this or any previous visit (from the past 24 hour(s)).   Exercise Prescription Changes - 11/16/22 1200       Response to Exercise   Blood Pressure (Admit) 110/68    Blood Pressure (Exercise) 126/72    Blood Pressure (Exit) 114/68    Heart Rate (Admit) 88 bpm    Heart Rate (Exercise) 90 bpm    Heart Rate (Exit) 90 bpm    Oxygen Saturation (Admit) 94 %    Oxygen Saturation (Exercise) 96 %    Oxygen Saturation (Exit) 96 %    Rating of Perceived Exertion (Exercise) 9    Perceived Dyspnea (Exercise) 0    Duration Continue with 30 min of aerobic exercise without signs/symptoms of physical distress.    Intensity THRR unchanged      Progression   Progression Continue to progress workloads to maintain intensity without signs/symptoms of physical  distress.      Resistance Training   Training Prescription Yes    Weight Blue Bands    Reps 10-15    Time 10 Minutes      Oxygen   Oxygen Continuous    Liters 4-6      Recumbant Bike   Level 4    Minutes 15    METs 3.2      NuStep   Level 4    Minutes 15    METs 1.9      Oxygen   Maintain Oxygen Saturation 88% or higher             Social History   Tobacco Use  Smoking Status Former   Packs/day: 2.00   Years: 50.00   Additional pack years: 0.00   Total pack years: 100.00   Types: Cigarettes   Quit date: 07/01/2016   Years since quitting: 6.3  Smokeless Tobacco Never    Goals Met:  Proper associated with RPD/PD & O2 Sat Independence with exercise equipment Exercise tolerated well No report of concerns or symptoms today Strength training completed today  Goals Unmet:  Not Applicable  Comments: Service time is from 1016 to 1142.    Dr. Mechele Collin is Medical Director for Pulmonary  Rehab at [facility].

## 2022-11-17 NOTE — Progress Notes (Signed)
Pulmonary Individual Treatment Plan  Patient Details  Name: FELICITY PENIX MRN: [id number] Date of Birth: 22-May-1945 Referring Provider:   [name] Pulmonary Rehab Walk Test from 09/24/2022 in [facility]  Referring Provider Meier       Initial Encounter Date:  Flowsheet Row Pulmonary Rehab Walk Test from 09/24/2022 in [facility]  Date 09/24/22       Visit Diagnosis: Stage 2 moderate COPD by GOLD classification (HCC)  Patient's Home Medications on Admission:   Current Outpatient Medications:    albuterol (VENTOLIN HFA) 108 (90 Base) MCG/ACT inhaler, TAKE 2 PUFFS BY MOUTH EVERY 6 HOURS AS NEEDED FOR WHEEZE OR SHORTNESS OF BREATH, Disp: 18 g, Rfl: 2   benzonatate (TESSALON) 100 MG capsule, Take 1 capsule (100 mg total) by mouth 3 (three) times daily as needed., Disp: 20 capsule, Rfl: 0   Budeson-Glycopyrrol-Formoterol (BREZTRI AEROSPHERE) 160-9-4.8 MCG/ACT AERO, Inhale 2 puffs into the lungs 2 (two) times daily. Rinse mouth out after use., Disp: 10.7 g, Rfl: 11   fluticasone (FLONASE) 50 MCG/ACT nasal spray, Place 2 sprays into both nostrils daily., Disp: 16 g, Rfl: 6   montelukast (SINGULAIR) 10 MG tablet, TAKE 1 TABLET BY MOUTH EVERYDAY AT BEDTIME, Disp: 30 tablet, Rfl: 3   promethazine (PHENERGAN) 25 MG suppository, PLACE 1 SUPPOSITORY (25 MG TOTAL) RECTALLY EVERY 8 (EIGHT) HOURS AS NEEDED FOR NAUSEA OR VOMITING., Disp: 12 suppository, Rfl: 0   rosuvastatin (CRESTOR) 20 MG tablet, TAKE 1 TABLET BY MOUTH EVERY DAY, Disp: 90 tablet, Rfl: 3   Vitamin D, Ergocalciferol, (DRISDOL) 1.25 MG (50000 UNIT) CAPS capsule, TAKE 1 CAPSULE (50,000 UNITS TOTAL) BY MOUTH EVERY 7 (SEVEN) DAYS, Disp: 12 capsule, Rfl: 0  Past Medical History: Past Medical History:  Diagnosis Date   Aortic atherosclerosis (HCC)    CT 2019   Emphysema of lung (HCC)    History of chicken pox     History of shingles    Osteoporosis     Tobacco Use: Social History   Tobacco Use  Smoking Status Former   Packs/day: 2.00   Years: 50.00   Additional pack years: 0.00   Total pack years: 100.00   Types: Cigarettes   Quit date: 07/01/2016   Years since quitting: 6.3  Smokeless Tobacco Never    Labs: Review Flowsheet  More data exists      Latest Ref Rng & Units 04/09/2019 03/09/2021 04/21/2021 12/28/2021 08/30/2022  Labs for ITP Cardiac and Pulmonary Rehab  Cholestrol 0 - 200 mg/dL 295  621  308  657  846   LDL (calc) 0 - 99 mg/dL 962  952  59  67  55   HDL-C >39.00 mg/dL 84.13  24.40  10.27  25.36  71.70   Trlycerides 0.0 - 149.0 mg/dL 64.4  03.4  74.2  59.5  78.0     Capillary Blood Glucose: No results found for: "GLUCAP"   Pulmonary Assessment Scores:  Pulmonary Assessment Scores     Row Name 09/24/22 1324         ADL UCSD   ADL Phase Entry     SOB Score total 26       CAT Score   CAT Score 11       mMRC Score   mMRC Score 2             UCSD: Self-administered rating of dyspnea associated with  activities of daily living (ADLs) 6-point scale (0 = "not at all" to 5 = "maximal or unable to do because of breathlessness")  Scoring Scores range from 0 to 120.  Minimally important difference is 5 units  CAT: CAT can identify the health impairment of COPD patients and is better correlated with disease progression.  CAT has a scoring range of zero to 40. The CAT score is classified into four groups of low (less than 10), medium (10 - 20), high (21-30) and very high (31-40) based on the impact level of disease on health status. A CAT score over 10 suggests significant symptoms.  A worsening CAT score could be explained by an exacerbation, poor medication adherence, poor inhaler technique, or progression of COPD or comorbid conditions.  CAT MCID is 2 points  mMRC: mMRC (Modified Medical Research Council) Dyspnea Scale is used to assess the degree of  baseline functional disability in patients of respiratory disease due to dyspnea. No minimal important difference is established. A decrease in score of 1 point or greater is considered a positive change.   Pulmonary Function Assessment:  Pulmonary Function Assessment - 09/24/22 1323       Breath   Bilateral Breath Sounds Decreased    Shortness of Breath Yes;Limiting activity             Exercise Target Goals: Exercise Program Goal: Individual exercise prescription set using results from initial 6 min walk test and THRR while considering  patient's activity barriers and safety.   Exercise Prescription Goal: Initial exercise prescription builds to 30-45 minutes a day of aerobic activity, 2-3 days per week.  Home exercise guidelines will be given to patient during program as part of exercise prescription that the participant will acknowledge.  Activity Barriers & Risk Stratification:  Activity Barriers & Cardiac Risk Stratification - 09/24/22 1331       Activity Barriers & Cardiac Risk Stratification   Activity Barriers Deconditioning;Muscular Weakness;Shortness of Breath             6 Minute Walk:  6 Minute Walk     Row Name 09/24/22 1600         6 Minute Walk   Phase Initial     Distance 556 feet     Walk Time 6 minutes     # of Rest Breaks 2  stopped at 1:50 for desat, 5:27 for desat     MPH 1.05     METS 1.28     RPE 12     Perceived Dyspnea  2     VO2 Peak 4.49     Symptoms Yes (comment)     Comments DOE     Resting HR 78 bpm     Resting BP 142/80     Resting Oxygen Saturation  90 %     Exercise Oxygen Saturation  during 6 min walk 79 %     Max Ex. HR 105 bpm     Max Ex. BP 174/98     2 Minute Post BP 142/76       Interval HR   1 Minute HR 79     2 Minute HR 96     3 Minute HR 83     4 Minute HR 85     5 Minute HR 105     6 Minute HR 96     2 Minute Post HR 81     Interval Heart Rate? Yes       Interval  Oxygen   Interval Oxygen? Yes      Baseline Oxygen Saturation % 90 %     1 Minute Oxygen Saturation % 89 %  stopped at 1:50 O2 80%, applied 2L O2     1 Minute Liters of Oxygen 0 L     2 Minute Oxygen Saturation % 79 %  stopped at 2:20 to increase O2 and pt began PLB     2 Minute Liters of Oxygen 2 L     3 Minute Oxygen Saturation % 92 %     3 Minute Liters of Oxygen 3 L     4 Minute Oxygen Saturation % 88 %  4:21 stopped pt d/t low O2 at 84%, pt able to PLB and O2>88%     4 Minute Liters of Oxygen 3 L     5 Minute Oxygen Saturation % 84 %  5:27 resumed walk test, O2 90% on 4L     5 Minute Liters of Oxygen 4 L     6 Minute Oxygen Saturation % 93 %     6 Minute Liters of Oxygen 4 L     2 Minute Post Oxygen Saturation % 96 %  5 min post, 91% room air     2 Minute Post Liters of Oxygen 4 L              Oxygen Initial Assessment:  Oxygen Initial Assessment - 09/24/22 1322       Home Oxygen   Home Oxygen Device None    Sleep Oxygen Prescription None    Home Exercise Oxygen Prescription None    Home Resting Oxygen Prescription None      Initial 6 min Walk   Oxygen Used Continuous    Liters per minute 4      Program Oxygen Prescription   Program Oxygen Prescription Continuous    Liters per minute 4      Intervention   Short Term Goals To learn and exhibit compliance with exercise, home and travel O2 prescription;To learn and understand importance of maintaining oxygen saturations>88%;To learn and demonstrate proper use of respiratory medications;To learn and understand importance of monitoring SPO2 with pulse oximeter and demonstrate accurate use of the pulse oximeter.;To learn and demonstrate proper pursed lip breathing techniques or other breathing techniques.     Long  Term Goals Exhibits compliance with exercise, home  and travel O2 prescription;Verbalizes importance of monitoring SPO2 with pulse oximeter and return demonstration;Maintenance of O2 saturations>88%;Exhibits proper breathing techniques, such as  pursed lip breathing or other method taught during program session;Compliance with respiratory medication;Other             Oxygen Re-Evaluation:  Oxygen Re-Evaluation     Row Name 10/11/22 1210 11/12/22 0920           Program Oxygen Prescription   Program Oxygen Prescription Continuous Continuous      Liters per minute 4 6      Comments -- 96% on 6L        Home Oxygen   Home Oxygen Device None None      Sleep Oxygen Prescription None None      Home Exercise Oxygen Prescription None None      Home Resting Oxygen Prescription None None        Goals/Expected Outcomes   Short Term Goals To learn and exhibit compliance with exercise, home and travel O2 prescription;To learn and understand importance of maintaining oxygen saturations>88%;To learn and demonstrate proper use of  respiratory medications;To learn and understand importance of monitoring SPO2 with pulse oximeter and demonstrate accurate use of the pulse oximeter.;To learn and demonstrate proper pursed lip breathing techniques or other breathing techniques.  To learn and exhibit compliance with exercise, home and travel O2 prescription;To learn and understand importance of maintaining oxygen saturations>88%;To learn and demonstrate proper use of respiratory medications;To learn and understand importance of monitoring SPO2 with pulse oximeter and demonstrate accurate use of the pulse oximeter.;To learn and demonstrate proper pursed lip breathing techniques or other breathing techniques.       Long  Term Goals Exhibits compliance with exercise, home  and travel O2 prescription;Verbalizes importance of monitoring SPO2 with pulse oximeter and return demonstration;Maintenance of O2 saturations>88%;Exhibits proper breathing techniques, such as pursed lip breathing or other method taught during program session;Compliance with respiratory medication;Other Exhibits compliance with exercise, home  and travel O2 prescription;Verbalizes  importance of monitoring SPO2 with pulse oximeter and return demonstration;Maintenance of O2 saturations>88%;Exhibits proper breathing techniques, such as pursed lip breathing or other method taught during program session;Compliance with respiratory medication;Other      Goals/Expected Outcomes Compliance and understanding of oxygen saturation monitoring and breathing techniques to decrease shortness of breath. Compliance and understanding of oxygen saturation monitoring and breathing techniques to decrease shortness of breath.               Oxygen Discharge (Final Oxygen Re-Evaluation):  Oxygen Re-Evaluation - 11/12/22 0920       Program Oxygen Prescription   Program Oxygen Prescription Continuous    Liters per minute 6    Comments 96% on 6L      Home Oxygen   Home Oxygen Device None    Sleep Oxygen Prescription None    Home Exercise Oxygen Prescription None    Home Resting Oxygen Prescription None      Goals/Expected Outcomes   Short Term Goals To learn and exhibit compliance with exercise, home and travel O2 prescription;To learn and understand importance of maintaining oxygen saturations>88%;To learn and demonstrate proper use of respiratory medications;To learn and understand importance of monitoring SPO2 with pulse oximeter and demonstrate accurate use of the pulse oximeter.;To learn and demonstrate proper pursed lip breathing techniques or other breathing techniques.     Long  Term Goals Exhibits compliance with exercise, home  and travel O2 prescription;Verbalizes importance of monitoring SPO2 with pulse oximeter and return demonstration;Maintenance of O2 saturations>88%;Exhibits proper breathing techniques, such as pursed lip breathing or other method taught during program session;Compliance with respiratory medication;Other    Goals/Expected Outcomes Compliance and understanding of oxygen saturation monitoring and breathing techniques to decrease shortness of breath.              Initial Exercise Prescription:  Initial Exercise Prescription - 09/24/22 1500       Date of Initial Exercise RX and Referring Provider   Date 09/24/22    Referring Provider Meier    Expected Discharge Date 12/23/22      Oxygen   Oxygen Continuous    Liters 4    Maintain Oxygen Saturation 88% or higher      Recumbant Bike   Level 1    RPM 20    Watts 40    Minutes 15    METs 2      Track   Minutes 15    METs 1.5      Prescription Details   Frequency (times per week) 2    Duration Progress to 30 minutes of continuous aerobic without  signs/symptoms of physical distress      Intensity   THRR 40-80% of Max Heartrate 58-116    Ratings of Perceived Exertion 11-13    Perceived Dyspnea 0-4      Progression   Progression Continue to progress workloads to maintain intensity without signs/symptoms of physical distress.      Resistance Training   Training Prescription Yes    Weight red bands    Reps 10-15             Perform Capillary Blood Glucose checks as needed.  Exercise Prescription Changes:   Exercise Prescription Changes     Row Name 10/05/22 1200 10/19/22 1100 11/02/22 1100 11/16/22 1200       Response to Exercise   Blood Pressure (Admit) 118/70 124/86 112/58 110/68    Blood Pressure (Exercise) 154/80 150/82 128/70 126/72    Blood Pressure (Exit) 112/80 138/90 110/68 114/68    Heart Rate (Admit) 90 bpm 84 bpm 93 bpm 88 bpm    Heart Rate (Exercise) 105 bpm 110 bpm 98 bpm 90 bpm    Heart Rate (Exit) 86 bpm 85 bpm 89 bpm 90 bpm    Oxygen Saturation (Admit) 90 % 92 %  2L 91 % 94 %    Oxygen Saturation (Exercise) 95 % 91 %  6L 94 % 96 %    Oxygen Saturation (Exit) 90 % 96 %  6L 93 % 96 %    Rating of Perceived Exertion (Exercise) 12 13 10 9     Perceived Dyspnea (Exercise) 1 2 2  0    Duration Progress to 30 minutes of  aerobic without signs/symptoms of physical distress Progress to 30 minutes of  aerobic without signs/symptoms of physical distress  Progress to 30 minutes of  aerobic without signs/symptoms of physical distress Continue with 30 min of aerobic exercise without signs/symptoms of physical distress.    Intensity THRR unchanged THRR unchanged THRR unchanged THRR unchanged      Progression   Progression Continue to progress workloads to maintain intensity without signs/symptoms of physical distress. Continue to progress workloads to maintain intensity without signs/symptoms of physical distress. Continue to progress workloads to maintain intensity without signs/symptoms of physical distress. Continue to progress workloads to maintain intensity without signs/symptoms of physical distress.      Resistance Training   Training Prescription Yes Yes Yes Yes    Weight red bands Blue Bands Blue Bands Blue Bands    Reps 10-15 10-15 10-15 10-15    Time 10 Minutes 10 Minutes 10 Minutes 10 Minutes      Oxygen   Oxygen Continuous Continuous Continuous Continuous    Liters 4 2-6 4-6 4-6      Recumbant Bike   Level 1 4 4 4     Minutes 15 15 15 15     METs 2 2.9 3.1 3.2      NuStep   Level -- -- -- 4    Minutes -- -- -- 15    METs -- -- -- 1.9      Track   Laps 9 9 -- --    Minutes 15 15 -- --    METs 2.38 2.38 -- --      Oxygen   Maintain Oxygen Saturation 88% or higher 88% or higher 88% or higher 88% or higher             Exercise Comments:   Exercise Goals and Review:   Exercise Goals     Row Name 09/24/22  1331 10/11/22 1205 11/12/22 0835         Exercise Goals   Increase Physical Activity Yes Yes Yes     Intervention Provide advice, education, support and counseling about physical activity/exercise needs.;Develop an individualized exercise prescription for aerobic and resistive training based on initial evaluation findings, risk stratification, comorbidities and participant's personal goals. Provide advice, education, support and counseling about physical activity/exercise needs.;Develop an individualized  exercise prescription for aerobic and resistive training based on initial evaluation findings, risk stratification, comorbidities and participant's personal goals. Provide advice, education, support and counseling about physical activity/exercise needs.;Develop an individualized exercise prescription for aerobic and resistive training based on initial evaluation findings, risk stratification, comorbidities and participant's personal goals.     Expected Outcomes Short Term: Attend rehab on a regular basis to increase amount of physical activity.;Long Term: Add in home exercise to make exercise part of routine and to increase amount of physical activity.;Long Term: Exercising regularly at least 3-5 days a week. Short Term: Attend rehab on a regular basis to increase amount of physical activity.;Long Term: Add in home exercise to make exercise part of routine and to increase amount of physical activity.;Long Term: Exercising regularly at least 3-5 days a week. Short Term: Attend rehab on a regular basis to increase amount of physical activity.;Long Term: Add in home exercise to make exercise part of routine and to increase amount of physical activity.;Long Term: Exercising regularly at least 3-5 days a week.     Increase Strength and Stamina Yes Yes Yes     Intervention Provide advice, education, support and counseling about physical activity/exercise needs.;Develop an individualized exercise prescription for aerobic and resistive training based on initial evaluation findings, risk stratification, comorbidities and participant's personal goals. Provide advice, education, support and counseling about physical activity/exercise needs.;Develop an individualized exercise prescription for aerobic and resistive training based on initial evaluation findings, risk stratification, comorbidities and participant's personal goals. Provide advice, education, support and counseling about physical activity/exercise needs.;Develop  an individualized exercise prescription for aerobic and resistive training based on initial evaluation findings, risk stratification, comorbidities and participant's personal goals.     Expected Outcomes Short Term: Increase workloads from initial exercise prescription for resistance, speed, and METs.;Short Term: Perform resistance training exercises routinely during rehab and add in resistance training at home;Long Term: Improve cardiorespiratory fitness, muscular endurance and strength as measured by increased METs and functional capacity ( ) Short Term: Increase workloads from initial exercise prescription for resistance, speed, and METs.;Short Term: Perform resistance training exercises routinely during rehab and add in resistance training at home;Long Term: Improve cardiorespiratory fitness, muscular endurance and strength as measured by increased METs and functional capacity ( ) Short Term: Increase workloads from initial exercise prescription for resistance, speed, and METs.;Short Term: Perform resistance training exercises routinely during rehab and add in resistance training at home;Long Term: Improve cardiorespiratory fitness, muscular endurance and strength as measured by increased METs and functional capacity ( )     Able to understand and use rate of perceived exertion (RPE) scale Yes Yes Yes     Intervention Provide education and explanation on how to use RPE scale Provide education and explanation on how to use RPE scale Provide education and explanation on how to use RPE scale     Expected Outcomes Short Term: Able to use RPE daily in rehab to express subjective intensity level;Long Term:  Able to use RPE to guide intensity level when exercising independently Short Term: Able to use RPE daily in rehab to express subjective  intensity level;Long Term:  Able to use RPE to guide intensity level when exercising independently Short Term: Able to use RPE daily in rehab to express subjective  intensity level;Long Term:  Able to use RPE to guide intensity level when exercising independently     Able to understand and use Dyspnea scale Yes Yes Yes     Intervention Provide education and explanation on how to use Dyspnea scale Provide education and explanation on how to use Dyspnea scale Provide education and explanation on how to use Dyspnea scale     Expected Outcomes Short Term: Able to use Dyspnea scale daily in rehab to express subjective sense of shortness of breath during exertion;Long Term: Able to use Dyspnea scale to guide intensity level when exercising independently Short Term: Able to use Dyspnea scale daily in rehab to express subjective sense of shortness of breath during exertion;Long Term: Able to use Dyspnea scale to guide intensity level when exercising independently Short Term: Able to use Dyspnea scale daily in rehab to express subjective sense of shortness of breath during exertion;Long Term: Able to use Dyspnea scale to guide intensity level when exercising independently     Knowledge and understanding of Target Heart Rate Range (THRR) Yes Yes Yes     Intervention Provide education and explanation of THRR including how the numbers were predicted and where they are located for reference Provide education and explanation of THRR including how the numbers were predicted and where they are located for reference Provide education and explanation of THRR including how the numbers were predicted and where they are located for reference     Expected Outcomes Short Term: Able to state/look up THRR;Long Term: Able to use THRR to govern intensity when exercising independently;Short Term: Able to use daily as guideline for intensity in rehab Short Term: Able to state/look up THRR;Long Term: Able to use THRR to govern intensity when exercising independently;Short Term: Able to use daily as guideline for intensity in rehab Short Term: Able to state/look up THRR;Long Term: Able to use THRR to  govern intensity when exercising independently;Short Term: Able to use daily as guideline for intensity in rehab     Understanding of Exercise Prescription Yes Yes Yes     Intervention Provide education, explanation, and written materials on patient's individual exercise prescription Provide education, explanation, and written materials on patient's individual exercise prescription Provide education, explanation, and written materials on patient's individual exercise prescription     Expected Outcomes Short Term: Able to explain program exercise prescription;Long Term: Able to explain home exercise prescription to exercise independently Short Term: Able to explain program exercise prescription;Long Term: Able to explain home exercise prescription to exercise independently Short Term: Able to explain program exercise prescription;Long Term: Able to explain home exercise prescription to exercise independently              Exercise Goals Re-Evaluation :  Exercise Goals Re-Evaluation     Row Name 10/11/22 1205 11/12/22 0835           Exercise Goal Re-Evaluation   Exercise Goals Review Increase Physical Activity;Able to understand and use Dyspnea scale;Understanding of Exercise Prescription;Increase Strength and Stamina;Knowledge and understanding of Target Heart Rate Range (THRR);Able to understand and use rate of perceived exertion (RPE) scale Increase Physical Activity;Able to understand and use Dyspnea scale;Understanding of Exercise Prescription;Increase Strength and Stamina;Knowledge and understanding of Target Heart Rate Range (THRR);Able to understand and use rate of perceived exertion (RPE) scale      Comments Bea  Joyce Gross has completed 1 exercise session. She exercises for 15 min on the track and recumbent bike. Pt averaged 2.38 METs on the track and 2 METs at 1 on the recumbent bike. She performs the warmup and cooldown standing without limitations. It is too soon to notate any discernable  progressions. Will continue to monitor and progress as able. Trudee Grip has completed 1 exercise session. She exercises for 15 min on the Nustep and recumbent bike. Pt averaged 2.38 METs on the track and 2 METs at 4 on the recumbent bike. She performs the warmup and cooldown standing without limitations. Trudee Grip has transitioned from the track to the Nustep due to an injury to her foot. She tolerated the Nustep better as she has increased her workload. She has also increased her workload on the recumbent bike. I have tried to talk to Trudee Grip about exercising at home, but she is still refusing oxygen. She does need oxygen to exercise. Will continue to monitor and progress as able.      Expected Outcomes Through exercise at rehab and home, the patient will decrease shortness of breath with daily activities and feel confident carrying out an exercise regimen at home. Through exercise at rehab and home, the patient will decrease shortness of breath with daily activities and feel confident carrying out an exercise regimen at home.               Discharge Exercise Prescription (Final Exercise Prescription Changes):  Exercise Prescription Changes - 11/16/22 1200       Response to Exercise   Blood Pressure (Admit) 110/68    Blood Pressure (Exercise) 126/72    Blood Pressure (Exit) 114/68    Heart Rate (Admit) 88 bpm    Heart Rate (Exercise) 90 bpm    Heart Rate (Exit) 90 bpm    Oxygen Saturation (Admit) 94 %    Oxygen Saturation (Exercise) 96 %    Oxygen Saturation (Exit) 96 %    Rating of Perceived Exertion (Exercise) 9    Perceived Dyspnea (Exercise) 0    Duration Continue with 30 min of aerobic exercise without signs/symptoms of physical distress.    Intensity THRR unchanged      Progression   Progression Continue to progress workloads to maintain intensity without signs/symptoms of physical distress.      Resistance Training   Training Prescription Yes    Weight Blue Bands    Reps 10-15     Time 10 Minutes      Oxygen   Oxygen Continuous    Liters 4-6      Recumbant Bike   Level 4    Minutes 15    METs 3.2      NuStep   Level 4    Minutes 15    METs 1.9      Oxygen   Maintain Oxygen Saturation 88% or higher             Nutrition:  Target Goals: Understanding of nutrition guidelines, daily intake of sodium 1500mg , cholesterol 200mg , calories 30% from fat and 7% or less from saturated fats, daily to have 5 or more servings of fruits and vegetables.  Biometrics:   Post Biometrics - 09/24/22 1259        Post  Biometrics   Grip Strength 22 kg             Nutrition Therapy Plan and Nutrition Goals:  Nutrition Therapy & Goals - 10/28/22 1109  Nutrition Therapy   Diet Heart healthy diet    Drug/Food Interactions Statins/Certain Fruits      Personal Nutrition Goals   Nutrition Goal Patient to improve diet quality by using the plate method as a guide for meal planning to include lean protein/plant protein, fruits, vegetables, whole grains, nonfat dairy as part of a well-balanced diet.    Personal Goal #2 Patient to identify strategies for weight loss of 0.5-2.0# per week.    Comments Trudee Grip reports motivation to lose weight; she reports weight gain of >50# over the last year. She acknowedges increased stress eating and over snacking at night.  We did discuss strategies for weight loss including mindful eating strategies, increasing high fiber/low calorie density foods, and protein shakes/supplements. She lives at home with her husband and reports eating 1-2 meals per day; she primarily does the grocery shopping and cooking. She enjoys seafood and vegetables regularly. Trudee Grip will benefit from participation in pulmonary rehab for nutrition, exercise, and lifestyle modification.      Intervention Plan   Intervention Prescribe, educate and counsel regarding individualized specific dietary modifications aiming towards targeted core components such  as weight, hypertension, lipid management, diabetes, heart failure and other comorbidities.;Nutrition handout(s) given to patient.    Expected Outcomes Short Term Goal: Understand basic principles of dietary content, such as calories, fat, sodium, cholesterol and nutrients.;Long Term Goal: Adherence to prescribed nutrition plan.;Short Term Goal: A plan has been developed with personal nutrition goals set during dietitian appointment.             Nutrition Assessments:  MEDIFICTS Score Key: ?70 Need to make dietary changes  40-70 Heart Healthy Diet ? 40 Therapeutic Level Cholesterol Diet   Picture Your Plate Scores: <78 Unhealthy dietary pattern with much room for improvement. 41-50 Dietary pattern unlikely to meet recommendations for good health and room for improvement. 51-60 More healthful dietary pattern, with some room for improvement.  >60 Healthy dietary pattern, although there may be some specific behaviors that could be improved.    Nutrition Goals Re-Evaluation:  Nutrition Goals Re-Evaluation     Row Name 09/30/22 1150 10/28/22 1109           Goals   Current Weight 222 lb 3.6 oz (100.8 kg) 223 lb 5.2 oz (101.3 kg)      Comment lipids WNL, CMP WNL no new labs; most recent labs lipids WNL, CMP WNL      Expected Outcome Bea Joyce Gross reports motivation to lose weight; she reports weight gain of >50# over the last year. She acknowedges increased stress eating and over snacking. We did discuss some plans for weight loss including [facility], Weight Watchers, etc. She lives at home with her husband and reports eating 1-2 meals per day; she primarily does the grocery shopping and cooking. Trudee Grip will benefit from participation in pulmonary rehab for nutrition, exercise, and lifestyle modification. Trudee Grip reports motivation to lose weight; she reports weight gain of >50# over the last year. She acknowedges increased stress eating and over snacking at night. We did  discuss strategies for weight loss including mindful eating strategies, increasing high fiber/low calorie density foods, and protein shakes/supplements. She lives at home with her husband and reports eating 1-2 meals per day; she primarily does the grocery shopping and cooking. She enjoys seafood and vegetables regularly. Trudee Grip will benefit from participation in pulmonary rehab for nutrition, exercise, and lifestyle modification.  Nutrition Goals Discharge (Final Nutrition Goals Re-Evaluation):  Nutrition Goals Re-Evaluation - 10/28/22 1109       Goals   Current Weight 223 lb 5.2 oz (101.3 kg)    Comment no new labs; most recent labs lipids WNL, CMP WNL    Expected Outcome Bea Joyce Gross reports motivation to lose weight; she reports weight gain of >50# over the last year. She acknowedges increased stress eating and over snacking at night. We did discuss strategies for weight loss including mindful eating strategies, increasing high fiber/low calorie density foods, and protein shakes/supplements. She lives at home with her husband and reports eating 1-2 meals per day; she primarily does the grocery shopping and cooking. She enjoys seafood and vegetables regularly. Trudee Grip will benefit from participation in pulmonary rehab for nutrition, exercise, and lifestyle modification.             Psychosocial: Target Goals: Acknowledge presence or absence of significant depression and/or stress, maximize coping skills, provide positive support system. Participant is able to verbalize types and ability to use techniques and skills needed for reducing stress and depression.  Initial Review & Psychosocial Screening:  Initial Psych Review & Screening - 09/24/22 1312       Initial Review   Current issues with Current Anxiety/Panic    Comments Pt admits to anxiety but denies need for counseling and/or medication.      Family Dynamics   Good Support System? Yes    Comments husband,  daughter, 5 sisters      Barriers   Psychosocial barriers to participate in program There are no identifiable barriers or psychosocial needs.             Quality of Life Scores:  Scores of 19 and below usually indicate a poorer quality of life in these areas.  A difference of  2-3 points is a clinically meaningful difference.  A difference of 2-3 points in the total score of the Quality of Life Index has been associated with significant improvement in overall quality of life, self-image, physical symptoms, and general health in studies assessing change in quality of life.  PHQ-9: Review Flowsheet  More data exists      09/24/2022 12/28/2021 12/10/2021 12/04/2020 11/29/2019  Depression screen PHQ 2/9  Decreased Interest 0 0 0 0 0  Down, Depressed, Hopeless 0 0 0 0 0  PHQ - 2 Score 0 0 0 0 0  Altered sleeping 0 0 - - -  Tired, decreased energy 1 0 - - -  Change in appetite 0 0 - - -  Feeling bad or failure about yourself  0 0 - - -  Trouble concentrating 0 0 - - -  Moving slowly or fidgety/restless 0 0 - - -  Suicidal thoughts 0 0 - - -  PHQ-9 Score 1 0 - - -  Difficult doing work/chores Not difficult at all Not difficult at all - - -   Interpretation of Total Score  Total Score Depression Severity:  1-4 = Minimal depression, 5-9 = Mild depression, 10-14 = Moderate depression, 15-19 = Moderately severe depression, 20-27 = Severe depression   Psychosocial Evaluation and Intervention:  Psychosocial Evaluation - 09/24/22 1314       Psychosocial Evaluation & Interventions   Interventions Stress management education;Relaxation education;Encouraged to exercise with the program and follow exercise prescription    Comments Trudee Grip admits to anxiety but denies the need for counselor and/or medication.    Expected Outcomes For Trudee Grip to participate  in rehab free of psychosocial barriers    Continue Psychosocial Services  Follow up required by counselor             Psychosocial  Re-Evaluation:  Psychosocial Re-Evaluation     Row Name 10/18/22 0921 11/15/22 1000           Psychosocial Re-Evaluation   Current issues with Current Anxiety/Panic Current Anxiety/Panic      Comments Trudee Grip still struggles with anxiety, but denies referral to therapist at this time. Staff will educate pt. on ways to deal with stress in a healthy way. Nothing has changed with Trudee Grip from the prior assessment. She still struggles with anxiety but declines a referral to a therapist currently. Trudee Grip is still resistant to getting home oxygen. She qualifies for home oxygen but hasn't come to terms that she needs to be wearing it continuously. Her pulmonologist is aware.      Expected Outcomes For Trudee Grip to participate in PR free of any psychoscial barriers or concerns. For Trudee Grip to continue to participate in PR free of any psychoscial barriers or concerns.      Interventions Stress management education;Relaxation education;Encouraged to attend Pulmonary Rehabilitation for the exercise Stress management education;Relaxation education;Encouraged to attend Pulmonary Rehabilitation for the exercise      Continue Psychosocial Services  No Follow up required No Follow up required               Psychosocial Discharge (Final Psychosocial Re-Evaluation):  Psychosocial Re-Evaluation - 11/15/22 1000       Psychosocial Re-Evaluation   Current issues with Current Anxiety/Panic    Comments Nothing has changed with Trudee Grip from the prior assessment. She still struggles with anxiety but declines a referral to a therapist currently. Trudee Grip is still resistant to getting home oxygen. She qualifies for home oxygen but hasn't come to terms that she needs to be wearing it continuously. Her pulmonologist is aware.    Expected Outcomes For Trudee Grip to continue to participate in PR free of any psychoscial barriers or concerns.    Interventions Stress management education;Relaxation education;Encouraged to  attend Pulmonary Rehabilitation for the exercise    Continue Psychosocial Services  No Follow up required             Education: Education Goals: Education classes will be provided on a weekly basis, covering required topics. Participant will state understanding/return demonstration of topics presented.  Learning Barriers/Preferences:  Learning Barriers/Preferences - 09/24/22 1316       Learning Barriers/Preferences   Learning Barriers Sight   wears glasses   Learning Preferences None             Education Topics: Introduction to Pulmonary Rehab Group instruction provided by PowerPoint, verbal discussion, and written material to support subject matter. Instructor reviews what Pulmonary Rehab is, the purpose of the program, and how patients are referred.     Know Your Numbers Group instruction that is supported by a PowerPoint presentation. Instructor discusses importance of knowing and understanding resting, exercise, and post-exercise oxygen saturation, heart rate, and blood pressure. Oxygen saturation, heart rate, blood pressure, rating of perceived exertion, and dyspnea are reviewed along with a normal range for these values.    Exercise for the Pulmonary Patient Group instruction that is supported by a PowerPoint presentation. Instructor discusses benefits of exercise, core components of exercise, frequency, duration, and intensity of an exercise routine, importance of utilizing pulse oximetry during exercise, safety while exercising, and options of  places to exercise outside of rehab.  Flowsheet Row PULMONARY REHAB CHRONIC OBSTRUCTIVE PULMONARY DISEASE from 11/04/2022 in [facility]  Date 11/04/22  Educator EP  Instruction Review Code 1- Verbalizes Understanding          MET Level  Group instruction provided by PowerPoint, verbal discussion, and written material to support subject matter. Instructor  reviews what METs are and how to increase METs.  Flowsheet Row PULMONARY REHAB CHRONIC OBSTRUCTIVE PULMONARY DISEASE from 10/14/2022 in [facility], [facility], [facility]  Date 10/14/22  Educator EP  Instruction Review Code 1- Verbalizes Understanding       Pulmonary Medications Verbally interactive group education provided by instructor with focus on inhaled medications and proper administration. Flowsheet Row PULMONARY REHAB CHRONIC OBSTRUCTIVE PULMONARY DISEASE from 10/28/2022 in [facility]  Date 10/28/22  Educator RT  Instruction Review Code 1- Verbalizes Understanding       Anatomy and Physiology of the Respiratory System Group instruction provided by PowerPoint, verbal discussion, and written material to support subject matter. Instructor reviews respiratory cycle and anatomical components of the respiratory system and their functions. Instructor also reviews differences in obstructive and restrictive respiratory diseases with examples of each.  Flowsheet Row PULMONARY REHAB CHRONIC OBSTRUCTIVE PULMONARY DISEASE from 10/21/2022 in [facility] for [facility], [facility], [facility] Health  Date 10/21/22  Educator RT  Instruction Review Code 1- Verbalizes Understanding       Oxygen Safety Group instruction provided by PowerPoint, verbal discussion, and written material to support subject matter. There is an overview of "What is Oxygen" and "Why do we need it".  Instructor also reviews how to create a safe environment for oxygen use, the importance of using oxygen as prescribed, and the risks of noncompliance. There is a brief discussion on traveling with oxygen and resources the patient may utilize.   Oxygen Use Group instruction provided by PowerPoint, verbal discussion, and written material to discuss how supplemental oxygen is prescribed and different types of oxygen  supply systems. Resources for more information are provided.    Breathing Techniques Group instruction that is supported by demonstration and informational handouts. Instructor discusses the benefits of pursed lip and diaphragmatic breathing and detailed demonstration on how to perform both.     Risk Factor Reduction Group instruction that is supported by a PowerPoint presentation. Instructor discusses the definition of a risk factor, different risk factors for pulmonary disease, and how the heart and lungs work together. Flowsheet Row PULMONARY REHAB CHRONIC OBSTRUCTIVE PULMONARY DISEASE from 09/30/2022 in [facility]  Date 09/30/22  Educator EP  Instruction Review Code 1- Verbalizes Understanding       MD Day A group question and answer session with a medical doctor that allows participants to ask questions that relate to their pulmonary disease state.   Nutrition for the Pulmonary Patient Group instruction provided by PowerPoint slides, verbal discussion, and written materials to support subject matter. The instructor gives an explanation and review of healthy diet recommendations, which includes a discussion on weight management, recommendations for fruit and vegetable consumption, as well as protein, fluid, caffeine, fiber, sodium, sugar, and alcohol. Tips for eating when patients are short of breath are discussed.    Other Education Group or individual verbal, written, or video instructions that support the educational goals of the  pulmonary rehab program.    Knowledge Questionnaire Score:  Knowledge Questionnaire Score - 09/24/22 1519       Knowledge Questionnaire Score   Pre Score 17/18             Core Components/Risk Factors/Patient Goals at Admission:  Personal Goals and Risk Factors at Admission - 09/24/22 1316       Core Components/Risk Factors/Patient Goals on Admission    Weight Management  Yes;Weight Loss    Intervention Weight Management: Develop a combined nutrition and exercise program designed to reach desired caloric intake, while maintaining appropriate intake of nutrient and fiber, sodium and fats, and appropriate energy expenditure required for the weight goal.;Weight Management: Provide education and appropriate resources to help participant work on and attain dietary goals.;Weight Management/Obesity: Establish reasonable short term and long term weight goals.;Obesity: Provide education and appropriate resources to help participant work on and attain dietary goals.    Expected Outcomes Short Term: Continue to assess and modify interventions until short term weight is achieved;Long Term: Adherence to nutrition and physical activity/exercise program aimed toward attainment of established weight goal;Weight Maintenance: Understanding of the daily nutrition guidelines, which includes 25-35% calories from fat, 7% or less cal from saturated fats, less than 200mg  cholesterol, less than 1.5gm of sodium, & 5 or more servings of fruits and vegetables daily;Weight Loss: Understanding of general recommendations for a balanced deficit meal plan, which promotes 1-2 lb weight loss per week and includes a negative energy balance of [phone] kcal/d;Understanding recommendations for meals to include 15-35% energy as protein, 25-35% energy from fat, 35-60% energy from carbohydrates, less than 200mg  of dietary cholesterol, 20-35 gm of total fiber daily;Understanding of distribution of calorie intake throughout the day with the consumption of 4-5 meals/snacks    Improve shortness of breath with ADL's Yes    Intervention Provide education, individualized exercise plan and daily activity instruction to help decrease symptoms of SOB with activities of daily living.    Expected Outcomes Short Term: Improve cardiorespiratory fitness to achieve a reduction of symptoms when performing ADLs;Long Term: Be able to  perform more ADLs without symptoms or delay the onset of symptoms             Core Components/Risk Factors/Patient Goals Review:   Goals and Risk Factor Review     Row Name 10/18/22 0924 11/15/22 1004           Core Components/Risk Factors/Patient Goals Review   Personal Goals Review Weight Management/Obesity;Improve shortness of breath with ADL's;Stress Weight Management/Obesity;Improve shortness of breath with ADL's;Stress      Review Trudee Grip has attended 4 sessions so far. She has not made any progress with weight loss yet, but she is working with staff dietician to achieve this goal. Trudee Grip still has shortness of breath with ADL's.This could be partly due to her needing oxygen at home, but refusing to have it ordered. Trudee Grip is dealing with stress over her declining health. Staff will work with Trudee Grip on healthy ways to decrease her stress. Pt. will benefit from participation in PR for nutrition, exercise and lifestyle modification. Goals in action for weight loss. Trudee Grip has not made progress of her goal of weight loss. She is working with our dietician on changes she can make to be successful. Goal progressing on reducing stress. She contributes the weight gain to stress and her decline in health. Staff have reiterated several times to Trudee Grip that she needs to be wearing oxygen continuously. 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 Resources have been provided to Trudee Grip for stress reduction and management. She currently declines a referral to a mental health professional. Goal not progressing for improving her shortness of breath with ADLs. Trudee Grip needs 4-6L on exertion while exercising. Though oxygen has been ordered for her, she adamantly refuses to pick up her oxygen at the [employer]. Her pulmonologist is aware of her refusal. Trudee Grip does enjoy the program, she is making progress  but is being held back due to her refusing to wear home oxygen.      Expected Outcomes See admission goals See admission goals               Core Components/Risk Factors/Patient Goals at Discharge (Final Review):   Goals and Risk Factor Review - 11/15/22 1004       Core Components/Risk Factors/Patient Goals Review   Personal Goals Review Weight Management/Obesity;Improve shortness of breath with ADL's;Stress    Review Goals in action for weight loss. Trudee Grip has not made progress of her goal of weight loss. She is working with our dietician on changes she can make to be successful. Goal progressing on reducing stress. She contributes the weight gain to stress and her decline in health. Staff have reiterated several times to Trudee Grip that she needs to be wearing oxygen continuously. 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 Resources have been provided to Trudee Grip for stress reduction and management. She currently declines a referral to a mental health professional. Goal not progressing for improving her shortness of breath with ADLs. Trudee Grip needs 4-6L on exertion while exercising. Though oxygen has been ordered for her, she adamantly refuses to pick up her oxygen at the [employer]. Her pulmonologist is aware of her refusal. Trudee Grip does enjoy the program, she is making progress but is being held back due to her refusing to wear home oxygen.    Expected Outcomes See admission goals             ITP Comments: Pt is making expected progress toward Pulmonary Rehab goals after completing 12 sessions. Recommend continued exercise, life style modification, education, and utilization of breathing techniques to increase stamina and strength, while also decreasing shortness of breath with exertion.  Dr. Mechele Collin is Medical Director for Pulmonary Rehab at [facility].

## 2022-11-18 ENCOUNTER — Encounter (HOSPITAL_COMMUNITY)
Admission: RE | Admit: 2022-11-18 | Discharge: 2022-11-18 | Disposition: A | Discharge status: Home / Self Care | Payer: No Typology Code available for payment source | Source: Ambulatory Visit | Attending: Student | Admitting: Student

## 2022-11-18 DIAGNOSIS — J449 Chronic obstructive pulmonary disease, unspecified: Secondary | ICD-10-CM | POA: Diagnosis not present

## 2022-11-18 NOTE — Progress Notes (Signed)
Daily Session Note  Patient Details  Name: Kelly Henderson MRN: [id number] Date of Birth: [birth year] Referring Provider:   [name] Pulmonary Rehab Walk Test from 09/24/2022 in [facility]  Referring Provider Meier       Encounter Date: 11/18/2022  Check In:  Session Check In - 11/18/22 1047       Check-In   Supervising physician immediately available to respond to emergencies [facility] MD immediately available    Physician(s) Micah Flesher, NP    Location [facility]    Staff Present Essie Hart, RN, BSN;Randi Idelle Crouch BS, ACSM-CEP, Exercise Physiologist;Kaylee Earlene Plater, MS, ACSM-CEP, Exercise Physiologist;Kasim Mccorkle Katrinka Blazing, RT    Virtual Visit No    Medication changes reported     No    Fall or balance concerns reported    No    Tobacco Cessation No Change    Warm-up and Cool-down Performed as group-led instruction    Resistance Training Performed Yes    VAD Patient? No    PAD/SET Patient? No      Pain Assessment   Currently in Pain? No/denies    Multiple Pain Sites No             Capillary Blood Glucose: No results found for this or any previous visit (from the past 24 hour(s)).    Social History   Tobacco Use  Smoking Status Former   Current packs/day: 0.00   Average packs/day: 2.0 packs/day for 50.0 years (100.0 ttl pk-yrs)   Types: Cigarettes   Start date: 07/01/1966   Quit date: 07/01/2016   Years since quitting: 6.3  Smokeless Tobacco Never    Goals Met:  Proper associated with RPD/PD & O2 Sat Independence with exercise equipment Exercise tolerated well No report of concerns or symptoms today Strength training completed today  Goals Unmet:  Not Applicable  Comments: Service time is from 1009 to 1155.    Dr. Mechele Collin is Medical Director for Pulmonary Rehab at [facility].

## 2022-11-23 ENCOUNTER — Encounter (HOSPITAL_COMMUNITY)
Admission: RE | Admit: 2022-11-23 | Discharge: 2022-11-23 | Disposition: A | Discharge status: Home / Self Care | Payer: No Typology Code available for payment source | Source: Ambulatory Visit | Attending: Student | Admitting: Student

## 2022-11-23 DIAGNOSIS — J449 Chronic obstructive pulmonary disease, unspecified: Secondary | ICD-10-CM

## 2022-11-23 NOTE — Progress Notes (Signed)
Daily Session Note  Patient Details  Name: NOVALYNN BRANAMAN MRN: [id number] Date of Birth: [birth year] Referring Provider:   [name] Pulmonary Rehab Walk Test from 09/24/2022 in [facility]  Referring Provider Meier       Encounter Date: 11/23/2022  Check In:  Session Check In - 11/23/22 1044       Check-In   Supervising physician immediately available to respond to emergencies [facility] MD immediately available    Physician(s) Edd Fabian, NP    Location [facility]    Staff Present Samantha Belarus, RD, Dutch Gray, RN, BSN;Randi Reeve BS, ACSM-CEP, Exercise Physiologist;Raymie Giammarco Earlene Plater, MS, ACSM-CEP, Exercise Physiologist;Casey Katrinka Blazing, RT    Virtual Visit No    Medication changes reported     No    Fall or balance concerns reported    No    Tobacco Cessation No Change    Warm-up and Cool-down Performed as group-led instruction    Resistance Training Performed Yes    VAD Patient? No    PAD/SET Patient? No      Pain Assessment   Currently in Pain? No/denies    Multiple Pain Sites No             Capillary Blood Glucose: No results found for this or any previous visit (from the past 24 hour(s)).    Social History   Tobacco Use  Smoking Status Former   Current packs/day: 0.00   Average packs/day: 2.0 packs/day for 50.0 years (100.0 ttl pk-yrs)   Types: Cigarettes   Start date: 07/01/1966   Quit date: 07/01/2016   Years since quitting: 6.4  Smokeless Tobacco Never    Goals Met:  Proper associated with RPD/PD & O2 Sat Exercise tolerated well No report of concerns or symptoms today Strength training completed today  Goals Unmet:  Not Applicable  Comments: Service time is from 1011 to 1134.    Dr. Mechele Collin is Medical Director for Pulmonary Rehab at [facility].

## 2022-11-25 ENCOUNTER — Encounter (HOSPITAL_COMMUNITY)
Admission: RE | Admit: 2022-11-25 | Discharge: 2022-11-25 | Disposition: A | Discharge status: Home / Self Care | Payer: No Typology Code available for payment source | Source: Ambulatory Visit | Attending: Student | Admitting: Student

## 2022-11-25 VITALS — Wt 216.5 lb

## 2022-11-25 DIAGNOSIS — J449 Chronic obstructive pulmonary disease, unspecified: Secondary | ICD-10-CM

## 2022-11-25 NOTE — Progress Notes (Signed)
Daily Session Note  Patient Details  Name: Kelly Henderson MRN: [id number] Date of Birth: Sep 08, 1945 Referring Provider:   [name] Pulmonary Rehab Walk Test from 09/24/2022 in [facility]  Referring Provider Meier       Encounter Date: 11/25/2022  Check In:  Session Check In - 11/25/22 1024       Check-In   Supervising physician immediately available to respond to emergencies [facility] MD immediately available    Physician(s) Bernadene Person, NP    [facility]    Staff Present Samantha Belarus, RD, Dutch Gray, RN, BSN;Sha Burling BS, ACSM-CEP, Exercise Physiologist;Kaylee Earlene Plater, MS, ACSM-CEP, Exercise Physiologist;Casey Katrinka Blazing, RT    Virtual Visit No    Medication changes reported     No    Fall or balance concerns reported    No    Tobacco Cessation No Change    Warm-up and Cool-down Performed as group-led instruction    Resistance Training Performed Yes    VAD Patient? No    PAD/SET Patient? No      Pain Assessment   Currently in Pain? No/denies    Multiple Pain Sites No             Capillary Blood Glucose: No results found for this or any previous visit (from the past 24 hour(s)).    Social History   Tobacco Use  Smoking Status Former   Current packs/day: 0.00   Average packs/day: 2.0 packs/day for 50.0 years (100.0 ttl pk-yrs)   Types: Cigarettes   Start date: 07/01/1966   Quit date: 07/01/2016   Years since quitting: 6.4  Smokeless Tobacco Never    Goals Met:  Independence with exercise equipment Exercise tolerated well No report of concerns or symptoms today Strength training completed today  Goals Unmet:  Not Applicable  Comments: Service time is from 1014 to 1150.    Dr. Mechele Collin is Medical Director for Pulmonary Rehab at [facility].

## 2022-11-30 ENCOUNTER — Encounter (HOSPITAL_COMMUNITY): Payer: Self-pay

## 2022-11-30 ENCOUNTER — Encounter (HOSPITAL_COMMUNITY): Payer: No Typology Code available for payment source

## 2022-12-02 ENCOUNTER — Encounter (HOSPITAL_COMMUNITY)
Admission: RE | Admit: 2022-12-02 | Discharge: 2022-12-02 | Disposition: A | Discharge status: Home / Self Care | Payer: No Typology Code available for payment source | Source: Ambulatory Visit | Attending: Student | Admitting: Student

## 2022-12-02 DIAGNOSIS — J449 Chronic obstructive pulmonary disease, unspecified: Secondary | ICD-10-CM

## 2022-12-02 NOTE — Progress Notes (Signed)
Daily Session Note  Patient Details  Name: Kelly Henderson MRN: [id number] Date of Birth: 26-Mar-1946 Referring Provider:   [name] Pulmonary Rehab Walk Test from 09/24/2022 in [facility]  Referring Provider Meier       Encounter Date: 12/02/2022  Check In:  Session Check In - 12/02/22 1102       Check-In   Supervising physician immediately available to respond to emergencies [facility] immediately available    Physician(s) Eligha Bridegroom, NP    [facility]    Staff Present Samantha Belarus, RD, LDN;Carlette Les Pou, RN, BSN;Jetta Walker BS, ACSM-CEP, Exercise Physiologist;Livvy Spilman Earlene Plater, MS, ACSM-CEP, Exercise Physiologist;Casey Katrinka Blazing, RT    Virtual Visit No    Medication changes reported     No    Fall or balance concerns reported    No    Tobacco Cessation No Change    Warm-up and Cool-down Performed as group-led instruction    Resistance Training Performed Yes    VAD Patient? No    PAD/SET Patient? No      Pain Assessment   Currently in Pain? No/denies    Multiple Pain Sites No             Capillary Blood Glucose: No results found for this or any previous visit (from the past 24 hour(s)).    Social History   Tobacco Use  Smoking Status Former   Current packs/day: 0.00   Average packs/day: 2.0 packs/day for 50.0 years (100.0 ttl pk-yrs)   Types: Cigarettes   Start date: 07/01/1966   Quit date: 07/01/2016   Years since quitting: 6.4  Smokeless Tobacco Never    Goals Met:  Proper associated with RPD/PD & O2 Sat Independence with exercise equipment Exercise tolerated well No report of concerns or symptoms today Strength training completed today  Goals Unmet:  Not Applicable  Comments: Service time is from 1006 to 1153.    Dr. Mechele Collin is Medical Director for Pulmonary Rehab at [facility].

## 2022-12-07 ENCOUNTER — Encounter (HOSPITAL_COMMUNITY)
Admission: RE | Admit: 2022-12-07 | Discharge: 2022-12-07 | Disposition: A | Discharge status: Home / Self Care | Payer: No Typology Code available for payment source | Source: Ambulatory Visit | Attending: Student | Admitting: Student

## 2022-12-07 DIAGNOSIS — J449 Chronic obstructive pulmonary disease, unspecified: Secondary | ICD-10-CM

## 2022-12-07 NOTE — Progress Notes (Signed)
Daily Session Note  Patient Details  Name: JAMELAH ROUTHIER MRN: [id number] Date of Birth: May 29, 1945 Referring Provider:   [name] Pulmonary Rehab Walk Test from 09/24/2022 in [facility]  Referring Provider Meier       Encounter Date: 12/07/2022  Check In:  Session Check In - 12/07/22 1054       Check-In   Supervising physician immediately available to respond to emergencies [facility] MD immediately available    Physician(s) Robin Searing, NP    [facility]    Staff Present Samantha Belarus, RD, Dutch Gray, RN, BSN;Carlette Les Pou, RN, BSN;Casey Smith, RT;Randi Reeve BS, ACSM-CEP, Exercise Physiologist    Virtual Visit No    Medication changes reported     No    Fall or balance concerns reported    No    Tobacco Cessation No Change    Warm-up and Cool-down Performed as group-led instruction    Resistance Training Performed Yes    VAD Patient? No    PAD/SET Patient? No      Pain Assessment   Currently in Pain? No/denies    Multiple Pain Sites No             Capillary Blood Glucose: No results found for this or any previous visit (from the past 24 hour(s)).    Social History   Tobacco Use  Smoking Status Former   Current packs/day: 0.00   Average packs/day: 2.0 packs/day for 50.0 years (100.0 ttl pk-yrs)   Types: Cigarettes   Start date: 07/01/1966   Quit date: 07/01/2016   Years since quitting: 6.4  Smokeless Tobacco Never    Goals Met:  Independence with exercise equipment Exercise tolerated well No report of concerns or symptoms today Strength training completed today  Goals Unmet:  Not Applicable  Comments: Service time is from 1014 to 1151    Dr. Mechele Collin is Medical Director for Pulmonary Rehab at [facility].

## 2022-12-09 ENCOUNTER — Encounter (HOSPITAL_COMMUNITY)
Admission: RE | Admit: 2022-12-09 | Discharge: 2022-12-09 | Disposition: A | Discharge status: Home / Self Care | Payer: No Typology Code available for payment source | Source: Ambulatory Visit | Attending: Student | Admitting: Student

## 2022-12-09 DIAGNOSIS — J449 Chronic obstructive pulmonary disease, unspecified: Secondary | ICD-10-CM | POA: Insufficient documentation

## 2022-12-09 NOTE — Progress Notes (Signed)
Daily Session Note  Patient Details  Name: Kelly Henderson MRN: [id number] Date of Birth: 09/20/1945 Referring Provider:   [name] Pulmonary Rehab Walk Test from 09/24/2022 in [facility]  Referring Provider Meier       Encounter Date: 12/09/2022  Check In:  Session Check In - 12/09/22 1137       Check-In   Supervising physician immediately available to respond to emergencies [facility] MD immediately available    Physician(s) Carlyon Shadow, NP    [facility]    Staff Present Elissa Lovett BS, ACSM-CEP, Exercise Physiologist;Samantha Belarus, RD, Dutch Gray, RN, BSN;Casey Smith, RT;Jetta Walker BS, ACSM-CEP, Exercise Physiologist    Virtual Visit No    Medication changes reported     No    Fall or balance concerns reported    No    Tobacco Cessation No Change    Warm-up and Cool-down Performed as group-led instruction    Resistance Training Performed Yes    VAD Patient? No    PAD/SET Patient? No      Pain Assessment   Currently in Pain? No/denies    Multiple Pain Sites No             Capillary Blood Glucose: No results found for this or any previous visit (from the past 24 hour(s)).    Social History   Tobacco Use  Smoking Status Former   Current packs/day: 0.00   Average packs/day: 2.0 packs/day for 50.0 years (100.0 ttl pk-yrs)   Types: Cigarettes   Start date: 07/01/1966   Quit date: 07/01/2016   Years since quitting: 6.4  Smokeless Tobacco Never    Goals Met:  Independence with exercise equipment Exercise tolerated well No report of concerns or symptoms today Strength training completed today  Goals Unmet:  Not Applicable  Comments: Service time is from 1015 to 1150    Dr. Mechele Collin is Medical Director for Pulmonary Rehab at [facility].

## 2022-12-14 ENCOUNTER — Ambulatory Visit: Payer: No Typology Code available for payment source

## 2022-12-14 ENCOUNTER — Encounter (HOSPITAL_COMMUNITY)
Admission: RE | Admit: 2022-12-14 | Discharge: 2022-12-14 | Disposition: A | Discharge status: Home / Self Care | Payer: No Typology Code available for payment source | Source: Ambulatory Visit | Attending: Internal Medicine | Admitting: Internal Medicine

## 2022-12-14 VITALS — Wt 214.9 lb

## 2022-12-14 DIAGNOSIS — J449 Chronic obstructive pulmonary disease, unspecified: Secondary | ICD-10-CM

## 2022-12-14 NOTE — Progress Notes (Signed)
Daily Session Note  Patient Details  Name: Kelly Henderson MRN: [id number] Date of Birth: 02/04/1946 Referring Provider:   [name] Pulmonary Rehab Walk Test from 09/24/2022 in [facility]  Referring Provider Meier       Encounter Date: 12/14/2022  Check In:  Session Check In - 12/14/22 1220       Check-In   Supervising physician immediately available to respond to emergencies [facility] MD immediately available    Physician(s) Carlyon Shadow, NP    Location [facility]    Staff Present Raford Pitcher, MS, ACSM-CEP, Exercise Physiologist;Randi Dionisio Paschal, ACSM-CEP, Exercise Physiologist;Samantha Belarus, RD, Dutch Gray, RN, BSN;Johnny Porter, MS, Exercise Physiologist; Katrinka Blazing, RT    Virtual Visit No    Medication changes reported     No    Fall or balance concerns reported    No    Tobacco Cessation No Change    Warm-up and Cool-down Performed as group-led instruction    Resistance Training Performed Yes    VAD Patient? No    PAD/SET Patient? No      Pain Assessment   Currently in Pain? No/denies    Multiple Pain Sites No             Capillary Blood Glucose: No results found for this or any previous visit (from the past 24 hour(s)).   Exercise Prescription Changes - 12/14/22 1200       Response to Exercise   Blood Pressure (Admit) 100/60    Blood Pressure (Exercise) 120/64    Blood Pressure (Exit) 108/66    Heart Rate (Admit) 82 bpm    Heart Rate (Exercise) 86 bpm    Heart Rate (Exit) 77 bpm    Oxygen Saturation (Admit) 94 %    Oxygen Saturation (Exercise) 95 %    Oxygen Saturation (Exit) 94 %    Rating of Perceived Exertion (Exercise) 11    Perceived Dyspnea (Exercise) 2    Duration Continue with 30 min of aerobic exercise without signs/symptoms of physical distress.    Intensity THRR unchanged      Progression   Progression Continue to progress workloads to maintain  intensity without signs/symptoms of physical distress.      Resistance Training   Training Prescription Yes    Weight Blue Bands    Reps 10-15    Time 10 Minutes      Oxygen   Oxygen Continuous    Liters 4-6      Recumbant Bike   Level 5    Minutes 15    METs 3.2      NuStep   Level 5    Minutes 15    METs 2.7      Oxygen   Maintain Oxygen Saturation 88% or higher             Social History   Tobacco Use  Smoking Status Former   Current packs/day: 0.00   Average packs/day: 2.0 packs/day for 50.0 years (100.0 ttl pk-yrs)   Types: Cigarettes   Start date: 07/01/1966   Quit date: 07/01/2016   Years since quitting: 6.4  Smokeless Tobacco Never    Goals Met:  Proper associated with RPD/PD & O2 Sat Independence with exercise equipment Exercise tolerated well No report of concerns or symptoms today Strength training completed today  Goals Unmet:  Not Applicable  Comments: Service time is from 1005 to 1140.    Dr. Erskine Squibb  Everardo All is Wellsite geologist for Pulmonary Rehab at [facility].

## 2022-12-15 NOTE — Progress Notes (Signed)
Pulmonary Individual Treatment Plan  Patient Details  Name: GENIENE ESCOBEDO MRN: [id number] Date of Birth: 11/09/1945 Referring Provider:   [name] Pulmonary Rehab Walk Test from 09/24/2022 in [facility]  Referring Provider Meier       Initial Encounter Date:  Flowsheet Row Pulmonary Rehab Walk Test from 09/24/2022 in [facility]  Date 09/24/22       Visit Diagnosis: Stage 2 moderate COPD by GOLD classification (HCC)  Patient's Home Medications on Admission:   Current Outpatient Medications:    albuterol (VENTOLIN HFA) 108 (90 Base) MCG/ACT inhaler, TAKE 2 PUFFS BY MOUTH EVERY 6 HOURS AS NEEDED FOR WHEEZE OR SHORTNESS OF BREATH, Disp: 18 g, Rfl: 2   benzonatate (TESSALON) 100 MG capsule, Take 1 capsule (100 mg total) by mouth 3 (three) times daily as needed., Disp: 20 capsule, Rfl: 0   Budeson-Glycopyrrol-Formoterol (BREZTRI AEROSPHERE) 160-9-4.8 MCG/ACT AERO, Inhale 2 puffs into the lungs 2 (two) times daily. Rinse mouth out after use., Disp: 10.7 g, Rfl: 11   fluticasone (FLONASE) 50 MCG/ACT nasal spray, Place 2 sprays into both nostrils daily., Disp: 16 g, Rfl: 6   montelukast (SINGULAIR) 10 MG tablet, TAKE 1 TABLET BY MOUTH EVERYDAY AT BEDTIME, Disp: 30 tablet, Rfl: 3   promethazine (PHENERGAN) 25 MG suppository, PLACE 1 SUPPOSITORY (25 MG TOTAL) RECTALLY EVERY 8 (EIGHT) HOURS AS NEEDED FOR NAUSEA OR VOMITING., Disp: 12 suppository, Rfl: 0   rosuvastatin (CRESTOR) 20 MG tablet, TAKE 1 TABLET BY MOUTH EVERY DAY, Disp: 90 tablet, Rfl: 3   Vitamin D, Ergocalciferol, (DRISDOL) 1.25 MG (50000 UNIT) CAPS capsule, TAKE 1 CAPSULE (50,000 UNITS TOTAL) BY MOUTH EVERY 7 (SEVEN) DAYS, Disp: 12 capsule, Rfl: 0  Past Medical History: Past Medical History:  Diagnosis Date   Aortic atherosclerosis (HCC)    CT 2019   Emphysema of lung (HCC)    History of chicken pox     History of shingles    Osteoporosis     Tobacco Use: Social History   Tobacco Use  Smoking Status Former   Current packs/day: 0.00   Average packs/day: 2.0 packs/day for 50.0 years (100.0 ttl pk-yrs)   Types: Cigarettes   Start date: 07/01/1966   Quit date: 07/01/2016   Years since quitting: 6.4  Smokeless Tobacco Never    Labs: Review Flowsheet  More data exists      Latest Ref Rng & Units 04/09/2019 03/09/2021 04/21/2021 12/28/2021 08/30/2022  Labs for ITP Cardiac and Pulmonary Rehab  Cholestrol 0 - 200 mg/dL 782  956  213  086  578   LDL (calc) 0 - 99 mg/dL 469  629  59  67  55   HDL-C >39.00 mg/dL 52.84  13.24  40.10  27.25  71.70   Trlycerides 0.0 - 149.0 mg/dL 36.6  44.0  34.7  42.5  78.0     Details            Capillary Blood Glucose: No results found for: "GLUCAP"   Pulmonary Assessment Scores:  Pulmonary Assessment Scores     Row Name 09/24/22 1324 12/09/22 1535       ADL UCSD   ADL Phase Entry Exit    SOB Score total 26 26      CAT Score   CAT Score 11 13      mMRC Score   mMRC Score 2 --  UCSD: Self-administered rating of dyspnea associated with activities of daily living (ADLs) 6-point scale (0 = "not at all" to 5 = "maximal or unable to do because of breathlessness")  Scoring Scores range from 0 to 120.  Minimally important difference is 5 units  CAT: CAT can identify the health impairment of COPD patients and is better correlated with disease progression.  CAT has a scoring range of zero to 40. The CAT score is classified into four groups of low (less than 10), medium (10 - 20), high (21-30) and very high (31-40) based on the impact level of disease on health status. A CAT score over 10 suggests significant symptoms.  A worsening CAT score could be explained by an exacerbation, poor medication adherence, poor inhaler technique, or progression of COPD or comorbid conditions.  CAT MCID is 2 points  mMRC: mMRC (Modified  Medical Research Council) Dyspnea Scale is used to assess the degree of baseline functional disability in patients of respiratory disease due to dyspnea. No minimal important difference is established. A decrease in score of 1 point or greater is considered a positive change.   Pulmonary Function Assessment:  Pulmonary Function Assessment - 09/24/22 1323       Breath   Bilateral Breath Sounds Decreased    Shortness of Breath Yes;Limiting activity             Exercise Target Goals: Exercise Program Goal: Individual exercise prescription set using results from initial 6 min walk test and THRR while considering  patient's activity barriers and safety.   Exercise Prescription Goal: Initial exercise prescription builds to 30-45 minutes a day of aerobic activity, 2-3 days per week.  Home exercise guidelines will be given to patient during program as part of exercise prescription that the participant will acknowledge.  Activity Barriers & Risk Stratification:  Activity Barriers & Cardiac Risk Stratification - 09/24/22 1331       Activity Barriers & Cardiac Risk Stratification   Activity Barriers Deconditioning;Muscular Weakness;Shortness of Breath             6 Minute Walk:  6 Minute Walk     Row Name 09/24/22 1600         6 Minute Walk   Phase Initial     Distance 556 feet     Walk Time 6 minutes     # of Rest Breaks 2  stopped at 1:50 for desat, 5:27 for desat     MPH 1.05     METS 1.28     RPE 12     Perceived Dyspnea  2     VO2 Peak 4.49     Symptoms Yes (comment)     Comments DOE     Resting HR 78 bpm     Resting BP 142/80     Resting Oxygen Saturation  90 %     Exercise Oxygen Saturation  during 6 min walk 79 %     Max Ex. HR 105 bpm     Max Ex. BP 174/98     2 Minute Post BP 142/76       Interval HR   1 Minute HR 79     2 Minute HR 96     3 Minute HR 83     4 Minute HR 85     5 Minute HR 105     6 Minute HR 96     2 Minute Post HR 81      Interval Heart Rate?  Yes       Interval Oxygen   Interval Oxygen? Yes     Baseline Oxygen Saturation % 90 %     1 Minute Oxygen Saturation % 89 %  stopped at 1:50 O2 80%, applied 2L O2     1 Minute Liters of Oxygen 0 L     2 Minute Oxygen Saturation % 79 %  stopped at 2:20 to increase O2 and pt began PLB     2 Minute Liters of Oxygen 2 L     3 Minute Oxygen Saturation % 92 %     3 Minute Liters of Oxygen 3 L     4 Minute Oxygen Saturation % 88 %  4:21 stopped pt d/t low O2 at 84%, pt able to PLB and O2>88%     4 Minute Liters of Oxygen 3 L     5 Minute Oxygen Saturation % 84 %  5:27 resumed walk test, O2 90% on 4L     5 Minute Liters of Oxygen 4 L     6 Minute Oxygen Saturation % 93 %     6 Minute Liters of Oxygen 4 L     2 Minute Post Oxygen Saturation % 96 %  5 min post, 91% room air     2 Minute Post Liters of Oxygen 4 L              Oxygen Initial Assessment:  Oxygen Initial Assessment - 09/24/22 1322       Home Oxygen   Home Oxygen Device None    Sleep Oxygen Prescription None    Home Exercise Oxygen Prescription None    Home Resting Oxygen Prescription None      Initial 6 min Walk   Oxygen Used Continuous    Liters per minute 4      Program Oxygen Prescription   Program Oxygen Prescription Continuous    Liters per minute 4      Intervention   Short Term Goals To learn and exhibit compliance with exercise, home and travel O2 prescription;To learn and understand importance of maintaining oxygen saturations>88%;To learn and demonstrate proper use of respiratory medications;To learn and understand importance of monitoring SPO2 with pulse oximeter and demonstrate accurate use of the pulse oximeter.;To learn and demonstrate proper pursed lip breathing techniques or other breathing techniques.     Long  Term Goals Exhibits compliance with exercise, home  and travel O2 prescription;Verbalizes importance of monitoring SPO2 with pulse oximeter and return  demonstration;Maintenance of O2 saturations>88%;Exhibits proper breathing techniques, such as pursed lip breathing or other method taught during program session;Compliance with respiratory medication;Other             Oxygen Re-Evaluation:  Oxygen Re-Evaluation     Row Name 10/11/22 1210 11/12/22 0920 12/03/22 1319         Program Oxygen Prescription   Program Oxygen Prescription Continuous Continuous Continuous     Liters per minute 4 6 4      Comments -- 96% on 6L decreased to 4L. O2 sat 925 on 4L       Home Oxygen   Home Oxygen Device None None None     Sleep Oxygen Prescription None None None     Home Exercise Oxygen Prescription None None None     Home Resting Oxygen Prescription None None None       Goals/Expected Outcomes   Short Term Goals To learn and exhibit compliance with exercise, home and travel O2 prescription;To  learn and understand importance of maintaining oxygen saturations>88%;To learn and demonstrate proper use of respiratory medications;To learn and understand importance of monitoring SPO2 with pulse oximeter and demonstrate accurate use of the pulse oximeter.;To learn and demonstrate proper pursed lip breathing techniques or other breathing techniques.  To learn and exhibit compliance with exercise, home and travel O2 prescription;To learn and understand importance of maintaining oxygen saturations>88%;To learn and demonstrate proper use of respiratory medications;To learn and understand importance of monitoring SPO2 with pulse oximeter and demonstrate accurate use of the pulse oximeter.;To learn and demonstrate proper pursed lip breathing techniques or other breathing techniques.  To learn and exhibit compliance with exercise, home and travel O2 prescription;To learn and understand importance of maintaining oxygen saturations>88%;To learn and demonstrate proper use of respiratory medications;To learn and understand importance of monitoring SPO2 with pulse oximeter  and demonstrate accurate use of the pulse oximeter.;To learn and demonstrate proper pursed lip breathing techniques or other breathing techniques.      Long  Term Goals Exhibits compliance with exercise, home  and travel O2 prescription;Verbalizes importance of monitoring SPO2 with pulse oximeter and return demonstration;Maintenance of O2 saturations>88%;Exhibits proper breathing techniques, such as pursed lip breathing or other method taught during program session;Compliance with respiratory medication;Other Exhibits compliance with exercise, home  and travel O2 prescription;Verbalizes importance of monitoring SPO2 with pulse oximeter and return demonstration;Maintenance of O2 saturations>88%;Exhibits proper breathing techniques, such as pursed lip breathing or other method taught during program session;Compliance with respiratory medication;Other Exhibits compliance with exercise, home  and travel O2 prescription;Verbalizes importance of monitoring SPO2 with pulse oximeter and return demonstration;Maintenance of O2 saturations>88%;Exhibits proper breathing techniques, such as pursed lip breathing or other method taught during program session;Compliance with respiratory medication;Other     Goals/Expected Outcomes Compliance and understanding of oxygen saturation monitoring and breathing techniques to decrease shortness of breath. Compliance and understanding of oxygen saturation monitoring and breathing techniques to decrease shortness of breath. Compliance and understanding of oxygen saturation monitoring and breathing techniques to decrease shortness of breath.              Oxygen Discharge (Final Oxygen Re-Evaluation):  Oxygen Re-Evaluation - 12/03/22 1319       Program Oxygen Prescription   Program Oxygen Prescription Continuous    Liters per minute 4    Comments decreased to 4L. O2 sat 925 on 4L      Home Oxygen   Home Oxygen Device None    Sleep Oxygen Prescription None    Home Exercise  Oxygen Prescription None    Home Resting Oxygen Prescription None      Goals/Expected Outcomes   Short Term Goals To learn and exhibit compliance with exercise, home and travel O2 prescription;To learn and understand importance of maintaining oxygen saturations>88%;To learn and demonstrate proper use of respiratory medications;To learn and understand importance of monitoring SPO2 with pulse oximeter and demonstrate accurate use of the pulse oximeter.;To learn and demonstrate proper pursed lip breathing techniques or other breathing techniques.     Long  Term Goals Exhibits compliance with exercise, home  and travel O2 prescription;Verbalizes importance of monitoring SPO2 with pulse oximeter and return demonstration;Maintenance of O2 saturations>88%;Exhibits proper breathing techniques, such as pursed lip breathing or other method taught during program session;Compliance with respiratory medication;Other    Goals/Expected Outcomes Compliance and understanding of oxygen saturation monitoring and breathing techniques to decrease shortness of breath.             Initial Exercise Prescription:  Initial Exercise  Prescription - 09/24/22 1500       Date of Initial Exercise RX and Referring Provider   Date 09/24/22    Referring Provider Meier    Expected Discharge Date 12/23/22      Oxygen   Oxygen Continuous    Liters 4    Maintain Oxygen Saturation 88% or higher      Recumbant Bike   Level 1    RPM 20    Watts 40    Minutes 15    METs 2      Track   Minutes 15    METs 1.5      Prescription Details   Frequency (times per week) 2    Duration Progress to 30 minutes of continuous aerobic without signs/symptoms of physical distress      Intensity   THRR 40-80% of Max Heartrate 58-116    Ratings of Perceived Exertion 11-13    Perceived Dyspnea 0-4      Progression   Progression Continue to progress workloads to maintain intensity without signs/symptoms of physical distress.       Resistance Training   Training Prescription Yes    Weight red bands    Reps 10-15             Perform Capillary Blood Glucose checks as needed.  Exercise Prescription Changes:   Exercise Prescription Changes     Row Name 10/05/22 1200 10/19/22 1100 11/02/22 1100 11/16/22 1200 11/30/22 1200     Response to Exercise   Blood Pressure (Admit) 118/70 124/86 112/58 110/68 108/66   Blood Pressure (Exercise) 154/80 150/82 128/70 126/72 --   Blood Pressure (Exit) 112/80 138/90 110/68 114/68 112/70   Heart Rate (Admit) 90 bpm 84 bpm 93 bpm 88 bpm 87 bpm   Heart Rate (Exercise) 105 bpm 110 bpm 98 bpm 90 bpm 100 bpm   Heart Rate (Exit) 86 bpm 85 bpm 89 bpm 90 bpm 86 bpm   Oxygen Saturation (Admit) 90 % 92 %  2L 91 % 94 % 93 %   Oxygen Saturation (Exercise) 95 % 91 %  6L 94 % 96 % 91 %   Oxygen Saturation (Exit) 90 % 96 %  6L 93 % 96 % 90 %   Rating of Perceived Exertion (Exercise) 12 13 10 9 10    Perceived Dyspnea (Exercise) 1 2 2  0 2   Duration Progress to 30 minutes of  aerobic without signs/symptoms of physical distress Progress to 30 minutes of  aerobic without signs/symptoms of physical distress Progress to 30 minutes of  aerobic without signs/symptoms of physical distress Continue with 30 min of aerobic exercise without signs/symptoms of physical distress. Continue with 30 min of aerobic exercise without signs/symptoms of physical distress.   Intensity THRR unchanged THRR unchanged THRR unchanged THRR unchanged THRR unchanged     Progression   Progression Continue to progress workloads to maintain intensity without signs/symptoms of physical distress. Continue to progress workloads to maintain intensity without signs/symptoms of physical distress. Continue to progress workloads to maintain intensity without signs/symptoms of physical distress. Continue to progress workloads to maintain intensity without signs/symptoms of physical distress. Continue to progress workloads to maintain  intensity without signs/symptoms of physical distress.     Resistance Training   Training Prescription Yes Yes Yes Yes Yes   Weight red bands Blue Bands Blue Bands Blue Bands Blue Bands   Reps 10-15 10-15 10-15 10-15 10-15   Time 10 Minutes 10 Minutes 10 Minutes 10 Minutes 10  Minutes     Oxygen   Oxygen Continuous Continuous Continuous Continuous Continuous   Liters 4 2-6 4-6 4-6 3-6     Recumbant Bike   Level 1 4 4 4 5    Minutes 15 15 15 15 15    METs 2 2.9 3.1 3.2 3.4     NuStep   Level -- -- -- 4 4   Minutes -- -- -- 15 15   METs -- -- -- 1.9 3.1     Track   Laps 9 9 -- -- --   Minutes 15 15 -- -- --   METs 2.38 2.38 -- -- --     Oxygen   Maintain Oxygen Saturation 88% or higher 88% or higher 88% or higher 88% or higher 88% or higher    Row Name 12/14/22 1200             Response to Exercise   Blood Pressure (Admit) 100/60       Blood Pressure (Exercise) 120/64       Blood Pressure (Exit) 108/66       Heart Rate (Admit) 82 bpm       Heart Rate (Exercise) 86 bpm       Heart Rate (Exit) 77 bpm       Oxygen Saturation (Admit) 94 %       Oxygen Saturation (Exercise) 95 %       Oxygen Saturation (Exit) 94 %       Rating of Perceived Exertion (Exercise) 11       Perceived Dyspnea (Exercise) 2       Duration Continue with 30 min of aerobic exercise without signs/symptoms of physical distress.       Intensity THRR unchanged         Progression   Progression Continue to progress workloads to maintain intensity without signs/symptoms of physical distress.         Resistance Training   Training Prescription Yes       Weight Blue Bands       Reps 10-15       Time 10 Minutes         Oxygen   Oxygen Continuous       Liters 4-6         Recumbant Bike   Level 5       Minutes 15       METs 3.2         NuStep   Level 5       Minutes 15       METs 2.7         Oxygen   Maintain Oxygen Saturation 88% or higher                Exercise  Comments:   Exercise Goals and Review:   Exercise Goals     Row Name 09/24/22 1331 10/11/22 1205 11/12/22 0835 12/03/22 1315       Exercise Goals   Increase Physical Activity Yes Yes Yes Yes    Intervention Provide advice, education, support and counseling about physical activity/exercise needs.;Develop an individualized exercise prescription for aerobic and resistive training based on initial evaluation findings, risk stratification, comorbidities and participant's personal goals. Provide advice, education, support and counseling about physical activity/exercise needs.;Develop an individualized exercise prescription for aerobic and resistive training based on initial evaluation findings, risk stratification, comorbidities and participant's personal goals. Provide advice, education, support and counseling about physical activity/exercise needs.;Develop an individualized exercise prescription  for aerobic and resistive training based on initial evaluation findings, risk stratification, comorbidities and participant's personal goals. Provide advice, education, support and counseling about physical activity/exercise needs.;Develop an individualized exercise prescription for aerobic and resistive training based on initial evaluation findings, risk stratification, comorbidities and participant's personal goals.    Expected Outcomes Short Term: Attend rehab on a regular basis to increase amount of physical activity.;Long Term: Add in home exercise to make exercise part of routine and to increase amount of physical activity.;Long Term: Exercising regularly at least 3-5 days a week. Short Term: Attend rehab on a regular basis to increase amount of physical activity.;Long Term: Add in home exercise to make exercise part of routine and to increase amount of physical activity.;Long Term: Exercising regularly at least 3-5 days a week. Short Term: Attend rehab on a regular basis to increase amount of physical  activity.;Long Term: Add in home exercise to make exercise part of routine and to increase amount of physical activity.;Long Term: Exercising regularly at least 3-5 days a week. Short Term: Attend rehab on a regular basis to increase amount of physical activity.;Long Term: Add in home exercise to make exercise part of routine and to increase amount of physical activity.;Long Term: Exercising regularly at least 3-5 days a week.    Increase Strength and Stamina Yes Yes Yes Yes    Intervention Provide advice, education, support and counseling about physical activity/exercise needs.;Develop an individualized exercise prescription for aerobic and resistive training based on initial evaluation findings, risk stratification, comorbidities and participant's personal goals. Provide advice, education, support and counseling about physical activity/exercise needs.;Develop an individualized exercise prescription for aerobic and resistive training based on initial evaluation findings, risk stratification, comorbidities and participant's personal goals. Provide advice, education, support and counseling about physical activity/exercise needs.;Develop an individualized exercise prescription for aerobic and resistive training based on initial evaluation findings, risk stratification, comorbidities and participant's personal goals. Provide advice, education, support and counseling about physical activity/exercise needs.;Develop an individualized exercise prescription for aerobic and resistive training based on initial evaluation findings, risk stratification, comorbidities and participant's personal goals.    Expected Outcomes Short Term: Increase workloads from initial exercise prescription for resistance, speed, and METs.;Short Term: Perform resistance training exercises routinely during rehab and add in resistance training at home;Long Term: Improve cardiorespiratory fitness, muscular endurance and strength as measured by  increased METs and functional capacity ( ) Short Term: Increase workloads from initial exercise prescription for resistance, speed, and METs.;Short Term: Perform resistance training exercises routinely during rehab and add in resistance training at home;Long Term: Improve cardiorespiratory fitness, muscular endurance and strength as measured by increased METs and functional capacity ( ) Short Term: Increase workloads from initial exercise prescription for resistance, speed, and METs.;Short Term: Perform resistance training exercises routinely during rehab and add in resistance training at home;Long Term: Improve cardiorespiratory fitness, muscular endurance and strength as measured by increased METs and functional capacity ( ) Short Term: Increase workloads from initial exercise prescription for resistance, speed, and METs.;Short Term: Perform resistance training exercises routinely during rehab and add in resistance training at home;Long Term: Improve cardiorespiratory fitness, muscular endurance and strength as measured by increased METs and functional capacity ( )    Able to understand and use rate of perceived exertion (RPE) scale Yes Yes Yes Yes    Intervention Provide education and explanation on how to use RPE scale Provide education and explanation on how to use RPE scale Provide education and explanation on how to use RPE scale Provide education and explanation on  how to use RPE scale    Expected Outcomes Short Term: Able to use RPE daily in rehab to express subjective intensity level;Long Term:  Able to use RPE to guide intensity level when exercising independently Short Term: Able to use RPE daily in rehab to express subjective intensity level;Long Term:  Able to use RPE to guide intensity level when exercising independently Short Term: Able to use RPE daily in rehab to express subjective intensity level;Long Term:  Able to use RPE to guide intensity level when exercising independently  Short Term: Able to use RPE daily in rehab to express subjective intensity level;Long Term:  Able to use RPE to guide intensity level when exercising independently    Able to understand and use Dyspnea scale Yes Yes Yes Yes    Intervention Provide education and explanation on how to use Dyspnea scale Provide education and explanation on how to use Dyspnea scale Provide education and explanation on how to use Dyspnea scale Provide education and explanation on how to use Dyspnea scale    Expected Outcomes Short Term: Able to use Dyspnea scale daily in rehab to express subjective sense of shortness of breath during exertion;Long Term: Able to use Dyspnea scale to guide intensity level when exercising independently Short Term: Able to use Dyspnea scale daily in rehab to express subjective sense of shortness of breath during exertion;Long Term: Able to use Dyspnea scale to guide intensity level when exercising independently Short Term: Able to use Dyspnea scale daily in rehab to express subjective sense of shortness of breath during exertion;Long Term: Able to use Dyspnea scale to guide intensity level when exercising independently Short Term: Able to use Dyspnea scale daily in rehab to express subjective sense of shortness of breath during exertion;Long Term: Able to use Dyspnea scale to guide intensity level when exercising independently    Knowledge and understanding of Target Heart Rate Range (THRR) Yes Yes Yes Yes    Intervention Provide education and explanation of THRR including how the numbers were predicted and where they are located for reference Provide education and explanation of THRR including how the numbers were predicted and where they are located for reference Provide education and explanation of THRR including how the numbers were predicted and where they are located for reference Provide education and explanation of THRR including how the numbers were predicted and where they are located for  reference    Expected Outcomes Short Term: Able to state/look up THRR;Long Term: Able to use THRR to govern intensity when exercising independently;Short Term: Able to use daily as guideline for intensity in rehab Short Term: Able to state/look up THRR;Long Term: Able to use THRR to govern intensity when exercising independently;Short Term: Able to use daily as guideline for intensity in rehab Short Term: Able to state/look up THRR;Long Term: Able to use THRR to govern intensity when exercising independently;Short Term: Able to use daily as guideline for intensity in rehab Short Term: Able to state/look up THRR;Long Term: Able to use THRR to govern intensity when exercising independently;Short Term: Able to use daily as guideline for intensity in rehab    Understanding of Exercise Prescription Yes Yes Yes Yes    Intervention Provide education, explanation, and written materials on patient's individual exercise prescription Provide education, explanation, and written materials on patient's individual exercise prescription Provide education, explanation, and written materials on patient's individual exercise prescription Provide education, explanation, and written materials on patient's individual exercise prescription    Expected Outcomes Short Term: Able to  explain program exercise prescription;Long Term: Able to explain home exercise prescription to exercise independently Short Term: Able to explain program exercise prescription;Long Term: Able to explain home exercise prescription to exercise independently Short Term: Able to explain program exercise prescription;Long Term: Able to explain home exercise prescription to exercise independently Short Term: Able to explain program exercise prescription;Long Term: Able to explain home exercise prescription to exercise independently             Exercise Goals Re-Evaluation :  Exercise Goals Re-Evaluation     Row Name 10/11/22 1205 11/12/22 0835 12/03/22  1315         Exercise Goal Re-Evaluation   Exercise Goals Review Increase Physical Activity;Able to understand and use Dyspnea scale;Understanding of Exercise Prescription;Increase Strength and Stamina;Knowledge and understanding of Target Heart Rate Range (THRR);Able to understand and use rate of perceived exertion (RPE) scale Increase Physical Activity;Able to understand and use Dyspnea scale;Understanding of Exercise Prescription;Increase Strength and Stamina;Knowledge and understanding of Target Heart Rate Range (THRR);Able to understand and use rate of perceived exertion (RPE) scale Increase Physical Activity;Able to understand and use Dyspnea scale;Understanding of Exercise Prescription;Increase Strength and Stamina;Knowledge and understanding of Target Heart Rate Range (THRR);Able to understand and use rate of perceived exertion (RPE) scale     Comments Trudee Grip has completed 1 exercise session. She exercises for 15 min on the track and recumbent bike. Pt averaged 2.38 METs on the track and 2 METs at 1 on the recumbent bike. She performs the warmup and cooldown standing without limitations. It is too soon to notate any discernable progressions. Will continue to monitor and progress as able. Trudee Grip has completed 1 exercise session. She exercises for 15 min on the Nustep and recumbent bike. Pt averaged 2.38 METs on the track and 2 METs at 4 on the recumbent bike. She performs the warmup and cooldown standing without limitations. Trudee Grip has transitioned from the track to the Nustep due to an injury to her foot. She tolerated the Nustep better as she has increased her workload. She has also increased her workload on the recumbent bike. I have tried to talk to Trudee Grip about exercising at home, but she is still refusing oxygen. She does need oxygen to exercise. Will continue to monitor and progress as able. Trudee Grip has completed 16 exercise sessions. She exercises for 15 min on the Nustep and recumbent bike.  Pt averages 2.8 METs at level 5 on the Nustep and 3.1 METs at level 4 on recumbent bike. She performs the warmup and cooldown standing without limitations. Trudee Grip has increased her workload for the Nustep as METs have slightly increased. Her workload for the recumbent bike has remained the same but will encourage her to increase. I am still unable to do a home ExRx with Trudee Grip because she does not want home O2. She understand the benefits of exercising with O2. I am unsure if she understands how much she needs O2. Will continue to monitor and progress able.     Expected Outcomes Through exercise at rehab and home, the patient will decrease shortness of breath with daily activities and feel confident carrying out an exercise regimen at home. Through exercise at rehab and home, the patient will decrease shortness of breath with daily activities and feel confident carrying out an exercise regimen at home. Through exercise at rehab and home, the patient will decrease shortness of breath with daily activities and feel confident carrying out an exercise regimen at home.  Discharge Exercise Prescription (Final Exercise Prescription Changes):  Exercise Prescription Changes - 12/14/22 1200       Response to Exercise   Blood Pressure (Admit) 100/60    Blood Pressure (Exercise) 120/64    Blood Pressure (Exit) 108/66    Heart Rate (Admit) 82 bpm    Heart Rate (Exercise) 86 bpm    Heart Rate (Exit) 77 bpm    Oxygen Saturation (Admit) 94 %    Oxygen Saturation (Exercise) 95 %    Oxygen Saturation (Exit) 94 %    Rating of Perceived Exertion (Exercise) 11    Perceived Dyspnea (Exercise) 2    Duration Continue with 30 min of aerobic exercise without signs/symptoms of physical distress.    Intensity THRR unchanged      Progression   Progression Continue to progress workloads to maintain intensity without signs/symptoms of physical distress.      Resistance Training   Training Prescription  Yes    Weight Blue Bands    Reps 10-15    Time 10 Minutes      Oxygen   Oxygen Continuous    Liters 4-6      Recumbant Bike   Level 5    Minutes 15    METs 3.2      NuStep   Level 5    Minutes 15    METs 2.7      Oxygen   Maintain Oxygen Saturation 88% or higher             Nutrition:  Target Goals: Understanding of nutrition guidelines, daily intake of sodium 1500mg , cholesterol 200mg , calories 30% from fat and 7% or less from saturated fats, daily to have 5 or more servings of fruits and vegetables.  Biometrics:   Post Biometrics - 09/24/22 1259        Post  Biometrics   Grip Strength 22 kg             Nutrition Therapy Plan and Nutrition Goals:  Nutrition Therapy & Goals - 11/25/22 1102       Nutrition Therapy   Diet Heart healthy diet    Drug/Food Interactions Statins/Certain Fruits      Personal Nutrition Goals   Nutrition Goal Patient to improve diet quality by using the plate method as a guide for meal planning to include lean protein/plant protein, fruits, vegetables, whole grains, nonfat dairy as part of a well-balanced diet.   goal in action.   Personal Goal #2 Patient to identify strategies for weight loss of 0.5-2.0# per week.   goal in action.   Comments Goals in action. Trudee Grip reports motivation to lose weight; she reports weight gain of >50# over the last year. She is down 6.8# since starting with our program. She has applied many strategies for weight loss including mindful eating strategies, high fiber/high protein food choices, etc. She continues to enjoy seafood and vegetables regularly. Trudee Grip will benefit from participation in pulmonary rehab for nutrition, exercise, and lifestyle modification.      Intervention Plan   Intervention Prescribe, educate and counsel regarding individualized specific dietary modifications aiming towards targeted core components such as weight, hypertension, lipid management, diabetes, heart failure and  other comorbidities.;Nutrition handout(s) given to patient.    Expected Outcomes Short Term Goal: Understand basic principles of dietary content, such as calories, fat, sodium, cholesterol and nutrients.;Long Term Goal: Adherence to prescribed nutrition plan.;Short Term Goal: A plan has been developed with personal nutrition goals set during dietitian appointment.  Nutrition Assessments:  Nutrition Assessments - 12/09/22 1048       Rate Your Plate Scores   Post Score 75            MEDIFICTS Score Key: ?70 Need to make dietary changes  40-70 Heart Healthy Diet ? 40 Therapeutic Level Cholesterol Diet  Flowsheet Row PULMONARY REHAB CHRONIC OBSTRUCTIVE PULMONARY DISEASE from 12/09/2022 in [facility]  Picture Your Plate Total Score on Discharge 75      Picture Your Plate Scores: <17 Unhealthy dietary pattern with much room for improvement. 41-50 Dietary pattern unlikely to meet recommendations for good health and room for improvement. 51-60 More healthful dietary pattern, with some room for improvement.  >60 Healthy dietary pattern, although there may be some specific behaviors that could be improved.    Nutrition Goals Re-Evaluation:  Nutrition Goals Re-Evaluation     Row Name 09/30/22 1150 10/28/22 1109 11/25/22 1102         Goals   Current Weight 222 lb 3.6 oz (100.8 kg) 223 lb 5.2 oz (101.3 kg) 216 lb 7.9 oz (98.2 kg)     Comment lipids WNL, CMP WNL no new labs; most recent labs lipids WNL, CMP WNL no new labs; most recent labs lipids WNL, CMP WNL     Expected Outcome Bea Joyce Gross reports motivation to lose weight; she reports weight gain of >50# over the last year. She acknowedges increased stress eating and over snacking. We did discuss some plans for weight loss including [facility], Weight Watchers, etc. She lives at home with her husband and reports eating 1-2 meals per day; she  primarily does the grocery shopping and cooking. Trudee Grip will benefit from participation in pulmonary rehab for nutrition, exercise, and lifestyle modification. Trudee Grip reports motivation to lose weight; she reports weight gain of >50# over the last year. She acknowedges increased stress eating and over snacking at night. We did discuss strategies for weight loss including mindful eating strategies, increasing high fiber/low calorie density foods, and protein shakes/supplements. She lives at home with her husband and reports eating 1-2 meals per day; she primarily does the grocery shopping and cooking. She enjoys seafood and vegetables regularly. Trudee Grip will benefit from participation in pulmonary rehab for nutrition, exercise, and lifestyle modification. Goals in action. Trudee Grip reports motivation to lose weight; she reports weight gain of >50# over the last year. She is down 6.8# (0.76# per week) since starting with our program. She has applied many strategies for weight loss including mindful eating strategies, high fiber/high protein food choices, etc. She continues to enjoy seafood and vegetables regularly. Trudee Grip will benefit from participation in pulmonary rehab for nutrition, exercise, and lifestyle modification.              Nutrition Goals Discharge (Final Nutrition Goals Re-Evaluation):  Nutrition Goals Re-Evaluation - 11/25/22 1102       Goals   Current Weight 216 lb 7.9 oz (98.2 kg)    Comment no new labs; most recent labs lipids WNL, CMP WNL    Expected Outcome Goals in action. Trudee Grip reports motivation to lose weight; she reports weight gain of >50# over the last year. She is down 6.8# (0.76# per week) since starting with our program. She has applied many strategies for weight loss including mindful eating strategies, high fiber/high protein food choices, etc. She continues to enjoy seafood and vegetables regularly. Trudee Grip will benefit  from participation in pulmonary rehab for  nutrition, exercise, and lifestyle modification.             Psychosocial: Target Goals: Acknowledge presence or absence of significant depression and/or stress, maximize coping skills, provide positive support system. Participant is able to verbalize types and ability to use techniques and skills needed for reducing stress and depression.  Initial Review & Psychosocial Screening:  Initial Psych Review & Screening - 09/24/22 1312       Initial Review   Current issues with Current Anxiety/Panic    Comments Pt admits to anxiety but denies need for counseling and/or medication.      Family Dynamics   Good Support System? Yes    Comments husband, daughter, 5 sisters      Barriers   Psychosocial barriers to participate in program There are no identifiable barriers or psychosocial needs.             Quality of Life Scores:  Scores of 19 and below usually indicate a poorer quality of life in these areas.  A difference of  2-3 points is a clinically meaningful difference.  A difference of 2-3 points in the total score of the Quality of Life Index has been associated with significant improvement in overall quality of life, self-image, physical symptoms, and general health in studies assessing change in quality of life.  PHQ-9: Review Flowsheet  More data exists      12/09/2022 09/24/2022 12/28/2021 12/10/2021 12/04/2020  Depression screen PHQ 2/9  Decreased Interest 0 0 0 0 0  Down, Depressed, Hopeless 0 0 0 0 0  PHQ - 2 Score 0 0 0 0 0  Altered sleeping 0 0 0 - -  Tired, decreased energy 1 1 0 - -  Change in appetite 0 0 0 - -  Feeling bad or failure about yourself  0 0 0 - -  Trouble concentrating 0 0 0 - -  Moving slowly or fidgety/restless 0 0 0 - -  Suicidal thoughts 0 0 0 - -  PHQ-9 Score 1 1 0 - -  Difficult doing work/chores Not difficult at all Not difficult at all Not difficult at all - -    Details           Interpretation of Total Score  Total Score  Depression Severity:  1-4 = Minimal depression, 5-9 = Mild depression, 10-14 = Moderate depression, 15-19 = Moderately severe depression, 20-27 = Severe depression   Psychosocial Evaluation and Intervention:  Psychosocial Evaluation - 09/24/22 1314       Psychosocial Evaluation & Interventions   Interventions Stress management education;Relaxation education;Encouraged to exercise with the program and follow exercise prescription    Comments Trudee Grip admits to anxiety but denies the need for counselor and/or medication.    Expected Outcomes For Trudee Grip to participate in rehab free of psychosocial barriers    Continue Psychosocial Services  Follow up required by counselor             Psychosocial Re-Evaluation:  Psychosocial Re-Evaluation     Row Name 10/18/22 0921 11/15/22 1000 12/10/22 0948         Psychosocial Re-Evaluation   Current issues with Current Anxiety/Panic Current Anxiety/Panic Current Anxiety/Panic     Comments Trudee Grip still struggles with anxiety, but denies referral to therapist at this time. Staff will educate pt. on ways to deal with stress in a healthy way. Nothing has changed with Trudee Grip from the prior assessment. She still  struggles with anxiety but declines a referral to a therapist currently. Trudee Grip is still resistant to getting home oxygen. She qualifies for home oxygen but hasn't come to terms that she needs to be wearing it continuously. Her pulmonologist is aware. Trudee Grip denies any new psychosocial barriers or concerns at this time. She does have anxiety sometimes, but is getting better with learning to handle it. Trudee Grip is still resistant to getting home oxygen. Staff has talked with Trudee Grip multiple times about the importance of having oxygen at home.     Expected Outcomes For Trudee Grip to participate in PR free of any psychoscial barriers or concerns. For Trudee Grip to continue to participate in PR free of any psychoscial barriers or concerns. For Trudee Grip to  continue to participate in PR free of any psychoscial barriers or concerns.     Interventions Stress management education;Relaxation education;Encouraged to attend Pulmonary Rehabilitation for the exercise Stress management education;Relaxation education;Encouraged to attend Pulmonary Rehabilitation for the exercise Encouraged to attend Pulmonary Rehabilitation for the exercise     Continue Psychosocial Services  No Follow up required No Follow up required No Follow up required              Psychosocial Discharge (Final Psychosocial Re-Evaluation):  Psychosocial Re-Evaluation - 12/10/22 0948       Psychosocial Re-Evaluation   Current issues with Current Anxiety/Panic    Comments Trudee Grip denies any new psychosocial barriers or concerns at this time. She does have anxiety sometimes, but is getting better with learning to handle it. Trudee Grip is still resistant to getting home oxygen. Staff has talked with Trudee Grip multiple times about the importance of having oxygen at home.    Expected Outcomes For Trudee Grip to continue to participate in PR free of any psychoscial barriers or concerns.    Interventions Encouraged to attend Pulmonary Rehabilitation for the exercise    Continue Psychosocial Services  No Follow up required             Education: Education Goals: Education classes will be provided on a weekly basis, covering required topics. Participant will state understanding/return demonstration of topics presented.  Learning Barriers/Preferences:  Learning Barriers/Preferences - 09/24/22 1316       Learning Barriers/Preferences   Learning Barriers Sight   wears glasses   Learning Preferences None             Education Topics: Introduction to Pulmonary Rehab Group instruction provided by PowerPoint, verbal discussion, and written material to support subject matter. Instructor reviews what Pulmonary Rehab is, the purpose of the program, and how patients are referred.     Know  Your Numbers Group instruction that is supported by a PowerPoint presentation. Instructor discusses importance of knowing and understanding resting, exercise, and post-exercise oxygen saturation, heart rate, and blood pressure. Oxygen saturation, heart rate, blood pressure, rating of perceived exertion, and dyspnea are reviewed along with a normal range for these values.  Flowsheet Row PULMONARY REHAB CHRONIC OBSTRUCTIVE PULMONARY DISEASE from 11/18/2022 in [facility] for [facility], Vascular, & Lung [facility]  Date 11/18/22  Educator EP  Instruction Review Code 1- Verbalizes Understanding       Exercise for the Pulmonary Patient Group instruction that is supported by a PowerPoint presentation. Instructor discusses benefits of exercise, core components of exercise, frequency, duration, and intensity of an exercise routine, importance of utilizing pulse oximetry during exercise, safety while exercising, and options of places to exercise outside  of rehab.  Flowsheet Row PULMONARY REHAB CHRONIC OBSTRUCTIVE PULMONARY DISEASE from 11/04/2022 in [facility], Vascular, [facility] Lung [facility]  Date 11/04/22  Educator EP  Instruction Review Code 1- Verbalizes Understanding          MET Level  Group instruction provided by PowerPoint, verbal discussion, and written material to support subject matter. Instructor reviews what METs are and how to increase METs.  Flowsheet Row PULMONARY REHAB CHRONIC OBSTRUCTIVE PULMONARY DISEASE from 10/14/2022 in [facility] for Heart, Vascular, & Lung Health  Date 10/14/22  Educator EP  Instruction Review Code 1- Verbalizes Understanding       Pulmonary Medications Verbally interactive group education provided by instructor with focus on inhaled medications and proper administration. Flowsheet Row PULMONARY REHAB CHRONIC OBSTRUCTIVE PULMONARY DISEASE from 10/28/2022 in [facility] for Heart, Vascular, & Lung Health  Date 10/28/22  Educator RT  Instruction Review Code 1- Verbalizes Understanding       Anatomy and Physiology of the Respiratory System Group instruction provided by PowerPoint, verbal discussion, and written material to support subject matter. Instructor reviews respiratory cycle and anatomical components of the respiratory system and their functions. Instructor also reviews differences in obstructive and restrictive respiratory diseases with examples of each.  Flowsheet Row PULMONARY REHAB CHRONIC OBSTRUCTIVE PULMONARY DISEASE from 10/21/2022 in [facility] for Heart, Vascular, & Lung Health  Date 10/21/22  Educator RT  Instruction Review Code 1- Verbalizes Understanding       Oxygen Safety Group instruction provided by PowerPoint, verbal discussion, and written material to support subject matter. There is an overview of "What is Oxygen" and "Why do we need it".  Instructor also reviews how to create a safe environment for oxygen use, the importance of using oxygen as prescribed, and the risks of noncompliance. There is a brief discussion on traveling with oxygen and resources the patient may utilize. Flowsheet Row PULMONARY REHAB CHRONIC OBSTRUCTIVE PULMONARY DISEASE from 11/25/2022 in [facility] for Heart, Vascular, & Lung Health  Date 11/25/22  Educator RN  Instruction Review Code 1- Verbalizes Understanding       Oxygen Use Group instruction provided by PowerPoint, verbal discussion, and written material to discuss how supplemental oxygen is prescribed and different types of oxygen supply systems. Resources for more information are provided.  Flowsheet Row PULMONARY REHAB CHRONIC OBSTRUCTIVE PULMONARY DISEASE from 12/02/2022 in [facility] for Heart, Vascular, & Lung Health  Date 12/02/22  Educator RT  Instruction Review Code 1- Verbalizes Understanding        Breathing Techniques Group instruction that is supported by demonstration and informational handouts. Instructor discusses the benefits of pursed lip and diaphragmatic breathing and detailed demonstration on how to perform both.  Flowsheet Row PULMONARY REHAB CHRONIC OBSTRUCTIVE PULMONARY DISEASE from 12/09/2022 in [facility] for Heart, Vascular, & Lung Health  Date 12/09/22  Educator RN  Instruction Review Code 1- Verbalizes Understanding        Risk Factor Reduction Group instruction that is supported by a PowerPoint presentation. Instructor discusses the definition of a risk factor, different risk factors for pulmonary disease, and how the heart and lungs work together. Flowsheet Row PULMONARY REHAB CHRONIC OBSTRUCTIVE PULMONARY DISEASE from 09/30/2022 in [facility] for Heart, Vascular, & Lung Health  Date 09/30/22  Educator EP  Instruction Review Code 1- Verbalizes Understanding  MD Day A group question and answer session with a medical doctor that allows participants to ask questions that relate to their pulmonary disease state.   Nutrition for the Pulmonary Patient Group instruction provided by PowerPoint slides, verbal discussion, and written materials to support subject matter. The instructor gives an explanation and review of healthy diet recommendations, which includes a discussion on weight management, recommendations for fruit and vegetable consumption, as well as protein, fluid, caffeine, fiber, sodium, sugar, and alcohol. Tips for eating when patients are short of breath are discussed.    Other Education Group or individual verbal, written, or video instructions that support the educational goals of the pulmonary rehab program.    Knowledge Questionnaire Score:  Knowledge Questionnaire Score - 12/09/22 1535       Knowledge Questionnaire Score   Post Score 18/18             Core Components/Risk  Factors/Patient Goals at Admission:  Personal Goals and Risk Factors at Admission - 09/24/22 1316       Core Components/Risk Factors/Patient Goals on Admission    Weight Management Yes;Weight Loss    Intervention Weight Management: Develop a combined nutrition and exercise program designed to reach desired caloric intake, while maintaining appropriate intake of nutrient and fiber, sodium and fats, and appropriate energy expenditure required for the weight goal.;Weight Management: Provide education and appropriate resources to help participant work on and attain dietary goals.;Weight Management/Obesity: Establish reasonable short term and long term weight goals.;Obesity: Provide education and appropriate resources to help participant work on and attain dietary goals.    Expected Outcomes Short Term: Continue to assess and modify interventions until short term weight is achieved;Long Term: Adherence to nutrition and physical activity/exercise program aimed toward attainment of established weight goal;Weight Maintenance: Understanding of the daily nutrition guidelines, which includes 25-35% calories from fat, 7% or less cal from saturated fats, less than 200mg  cholesterol, less than 1.5gm of sodium, & 5 or more servings of fruits and vegetables daily;Weight Loss: Understanding of general recommendations for a balanced deficit meal plan, which promotes 1-2 lb weight loss per week and includes a negative energy balance of [phone] kcal/d;Understanding recommendations for meals to include 15-35% energy as protein, 25-35% energy from fat, 35-60% energy from carbohydrates, less than 200mg  of dietary cholesterol, 20-35 gm of total fiber daily;Understanding of distribution of calorie intake throughout the day with the consumption of 4-5 meals/snacks    Improve shortness of breath with ADL's Yes    Intervention Provide education, individualized exercise plan and daily activity instruction to help decrease symptoms of  SOB with activities of daily living.    Expected Outcomes Short Term: Improve cardiorespiratory fitness to achieve a reduction of symptoms when performing ADLs;Long Term: Be able to perform more ADLs without symptoms or delay the onset of symptoms             Core Components/Risk Factors/Patient Goals Review:   Goals and Risk Factor Review     Row Name 10/18/22 0924 11/15/22 1004 12/10/22 0956         Core Components/Risk Factors/Patient Goals Review   Personal Goals Review Weight Management/Obesity;Improve shortness of breath with ADL's;Stress Weight Management/Obesity;Improve shortness of breath with ADL's;Stress Weight Management/Obesity;Improve shortness of breath with ADL's;Stress     Review Trudee Grip has attended 4 sessions so far. She has not made any progress with weight loss yet, but she is working with staff dietician to achieve this goal. Trudee Grip still has shortness of breath with  ADL's.This could be partly due to her needing oxygen at home, but refusing to have it ordered. Trudee Grip is dealing with stress over her declining health. Staff will work with Trudee Grip on healthy ways to decrease her stress. Pt. will benefit from participation in PR for nutrition, exercise and lifestyle modification. Goals in action for weight loss. Trudee Grip has not made progress of her goal of weight loss. She is working with our dietician on changes she can make to be successful. Goal progressing on reducing stress. She contributes the weight gain to stress and her decline in health. Staff have reiterated several times to Trudee Grip that she needs to be wearing oxygen continuously. 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 Resources have been provided to Trudee Grip for stress reduction and management. She currently declines a referral to a mental health professional. Goal not progressing for improving her shortness of breath with ADLs. Trudee Grip  needs 4-6L on exertion while exercising. Though oxygen has been ordered for her, she adamantly refuses to pick up her oxygen at the [employer]. Her pulmonologist is aware of her refusal. Trudee Grip does enjoy the program, she is making progress but is being held back due to her refusing to wear home oxygen. Goal progressing for weight loss. Trudee Grip has loss weight since starting the program, but has not met her goal yet. She is working with staff dietician to obtain her weight loss goals. Goal progressing on reducing stress. She does not mention any new stressors, but her health still remains a stressor for her. Goal not progressing for improving shortness of breath with ADL's. Trudee Grip still refuses to wear oxygen outside of PR. Staff has educated her multiple times on the importance of wearing oxygen. We will continue to monitor her progress throughout the program.     Expected Outcomes See admission goals See admission goals See admission goals              Core Components/Risk Factors/Patient Goals at Discharge (Final Review):   Goals and Risk Factor Review - 12/10/22 0956       Core Components/Risk Factors/Patient Goals Review   Personal Goals Review Weight Management/Obesity;Improve shortness of breath with ADL's;Stress    Review Goal progressing for weight loss. Trudee Grip has loss weight since starting the program, but has not met her goal yet. She is working with staff dietician to obtain her weight loss goals. Goal progressing on reducing stress. She does not mention any new stressors, but her health still remains a stressor for her. Goal not progressing for improving shortness of breath with ADL's. Trudee Grip still refuses to wear oxygen outside of PR. Staff has educated her multiple times on the importance of wearing oxygen. We will continue to monitor her progress throughout the program.    Expected Outcomes See admission goals             ITP Comments:Pt is making  expected progress toward Pulmonary Rehab goals after completing 19 sessions. Recommend continued exercise, life style modification, education, and utilization of breathing techniques to increase stamina and strength, while also decreasing shortness of breath with exertion.  Dr. Mechele Collin is Medical Director for Pulmonary Rehab at [facility].     Comments: Dr. Mechele Collin is Medical Director for Pulmonary Rehab at [facility].

## 2022-12-16 ENCOUNTER — Encounter (HOSPITAL_COMMUNITY): Payer: No Typology Code available for payment source

## 2022-12-17 ENCOUNTER — Ambulatory Visit (INDEPENDENT_AMBULATORY_CARE_PROVIDER_SITE_OTHER): Payer: No Typology Code available for payment source | Admitting: *Deleted

## 2022-12-17 VITALS — Ht 67.0 in | Wt 214.9 lb

## 2022-12-17 DIAGNOSIS — Z0000 Encounter for general adult medical examination without abnormal findings: Secondary | ICD-10-CM | POA: Diagnosis not present

## 2022-12-17 NOTE — Patient Instructions (Signed)
Ms. Kelly Henderson , Thank you for taking time to come for your Medicare Wellness Visit. I appreciate your ongoing commitment to your health goals. Please review the following plan we discussed and let me know if I can assist you in the future.     This is a list of the screening recommended for you and due dates:  Health Maintenance  Topic Date Due   Flu Shot  12/09/2022   COVID-19 Vaccine (3 - 2023-24 season) 03/01/2023*   Screening for Lung Cancer  08/17/2023   Medicare Annual Wellness Visit  12/17/2023   DTaP/Tdap/Td vaccine (3 - Td or Tdap) 12/29/2031   Pneumonia Vaccine  Completed   DEXA scan (bone density measurement)  Completed   Hepatitis C Screening  Completed   Zoster (Shingles) Vaccine  Completed   HPV Vaccine  Aged Out   Colon Cancer Screening  Discontinued  *Topic was postponed. The date shown is not the original due date.    Next appointment: Follow up in one year for your annual wellness visit.   Preventive Care 76 Years and Older, Female Preventive care refers to lifestyle choices and visits with your health care provider that can promote health and wellness. What does preventive care include? A yearly physical exam. This is also called an annual well check. Dental exams once or twice a year. Routine eye exams. Ask your health care provider how often you should have your eyes checked. Personal lifestyle choices, including: Daily care of your teeth and gums. Regular physical activity. Eating a healthy diet. Avoiding tobacco and drug use. Limiting alcohol use. Practicing safe sex. Taking low-dose aspirin every day. Taking vitamin and mineral supplements as recommended by your health care provider. What happens during an annual well check? The services and screenings done by your health care provider during your annual well check will depend on your age, overall health, lifestyle risk factors, and family history of disease. Counseling  Your health care provider may  ask you questions about your: Alcohol use. Tobacco use. Drug use. Emotional well-being. Home and relationship well-being. Sexual activity. Eating habits. History of falls. Memory and ability to understand (cognition). Work and work Astronomer. Reproductive health. Screening  You may have the following tests or measurements: Height, weight, and BMI. Blood pressure. Lipid and cholesterol levels. These may be checked every 5 years, or more frequently if you are over 55 years old. Skin check. Lung cancer screening. You may have this screening every year starting at age 76 if you have a 30-pack-year history of smoking and currently smoke or have quit within the past 15 years. Fecal occult blood test (FOBT) of the stool. You may have this test every year starting at age 76. Flexible sigmoidoscopy or colonoscopy. You may have a sigmoidoscopy every 5 years or a colonoscopy every 10 years starting at age 76. Hepatitis C blood test. Hepatitis B blood test. Sexually transmitted disease (STD) testing. Diabetes screening. This is done by checking your blood sugar (glucose) after you have not eaten for a while (fasting). You may have this done every 1-3 years. Bone density scan. This is done to screen for osteoporosis. You may have this done starting at age 76. Mammogram. This may be done every 1-2 years. Talk to your health care provider about how often you should have regular mammograms. Talk with your health care provider about your test results, treatment options, and if necessary, the need for more tests. Vaccines  Your health care provider may recommend certain vaccines, such  as: Influenza vaccine. This is recommended every year. Tetanus, diphtheria, and acellular pertussis (Tdap, Td) vaccine. You may need a Td booster every 10 years. Zoster vaccine. You may need this after age 76. Pneumococcal 13-valent conjugate (PCV13) vaccine. One dose is recommended after age 76. Pneumococcal  polysaccharide (PPSV23) vaccine. One dose is recommended after age 76. Talk to your health care provider about which screenings and vaccines you need and how often you need them. This information is not intended to replace advice given to you by your health care provider. Make sure you discuss any questions you have with your health care provider. Document Released: 05/23/2015 Document Revised: 01/14/2016 Document Reviewed: 02/25/2015 Elsevier Interactive Patient Education  2017 [employer].  Fall Prevention in the Home Falls can cause injuries. They can happen to people of all ages. There are many things you can do to make your home safe and to help prevent falls. What can I do on the outside of my home? Regularly fix the edges of walkways and driveways and fix any cracks. Remove anything that might make you trip as you walk through a door, such as a raised step or threshold. Trim any bushes or trees on the path to your home. Use bright outdoor lighting. Clear any walking paths of anything that might make someone trip, such as rocks or tools. Regularly check to see if handrails are loose or broken. Make sure that both sides of any steps have handrails. Any raised decks and porches should have guardrails on the edges. Have any leaves, snow, or ice cleared regularly. Use sand or salt on walking paths during winter. Clean up any spills in your garage right away. This includes oil or grease spills. What can I do in the bathroom? Use night lights. Install grab bars by the toilet and in the tub and shower. Do not use towel bars as grab bars. Use non-skid mats or decals in the tub or shower. If you need to sit down in the shower, use a plastic, non-slip stool. Keep the floor dry. Clean up any water that spills on the floor as soon as it happens. Remove soap buildup in the tub or shower regularly. Attach bath mats securely with double-sided non-slip rug tape. Do not have throw rugs and other  things on the floor that can make you trip. What can I do in the bedroom? Use night lights. Make sure that you have a light by your bed that is easy to reach. Do not use any sheets or blankets that are too big for your bed. They should not hang down onto the floor. Have a firm chair that has side arms. You can use this for support while you get dressed. Do not have throw rugs and other things on the floor that can make you trip. What can I do in the kitchen? Clean up any spills right away. Avoid walking on wet floors. Keep items that you use a lot in easy-to-reach places. If you need to reach something above you, use a strong step stool that has a grab bar. Keep electrical cords out of the way. Do not use floor polish or wax that makes floors slippery. If you must use wax, use non-skid floor wax. Do not have throw rugs and other things on the floor that can make you trip. What can I do with my stairs? Do not leave any items on the stairs. Make sure that there are handrails on both sides of the stairs and use  them. Fix handrails that are broken or loose. Make sure that handrails are as long as the stairways. Check any carpeting to make sure that it is firmly attached to the stairs. Fix any carpet that is loose or worn. Avoid having throw rugs at the top or bottom of the stairs. If you do have throw rugs, attach them to the floor with carpet tape. Make sure that you have a light switch at the top of the stairs and the bottom of the stairs. If you do not have them, ask someone to add them for you. What else can I do to help prevent falls? Wear shoes that: Do not have high heels. Have rubber bottoms. Are comfortable and fit you well. Are closed at the toe. Do not wear sandals. If you use a stepladder: Make sure that it is fully opened. Do not climb a closed stepladder. Make sure that both sides of the stepladder are locked into place. Ask someone to hold it for you, if possible. Clearly  mark and make sure that you can see: Any grab bars or handrails. First and last steps. Where the edge of each step is. Use tools that help you move around (mobility aids) if they are needed. These include: Canes. Walkers. Scooters. Crutches. Turn on the lights when you go into a dark area. Replace any light bulbs as soon as they burn out. Set up your furniture so you have a clear path. Avoid moving your furniture around. If any of your floors are uneven, fix them. If there are any pets around you, be aware of where they are. Review your medicines with your doctor. Some medicines can make you feel dizzy. This can increase your chance of falling. Ask your doctor what other things that you can do to help prevent falls. This information is not intended to replace advice given to you by your health care provider. Make sure you discuss any questions you have with your health care provider. Document Released: 02/20/2009 Document Revised: 10/02/2015 Document Reviewed: 05/31/2014 Elsevier Interactive Patient Education  2017 [employer].

## 2022-12-17 NOTE — Progress Notes (Signed)
Subjective:   Kelly Henderson is a 77 y.o. female who presents for Medicare Annual (Subsequent) preventive examination.  Visit Complete: Virtual  I connected with  [name] No on 12/17/22 by a audio enabled telemedicine application and verified that I am speaking with the correct person using two identifiers.  Patient Location: Home  Provider Location: Office/Clinic  I discussed the limitations of evaluation and management by telemedicine. The patient expressed understanding and agreed to proceed.  Patient Medicare AWV questionnaire was completed by the patient on 12/10/22; I have confirmed that all information answered by patient is correct and no changes since this date.  Review of Systems     Cardiac Risk Factors include: advanced age (>66men, >72 women);obesity (BMI >30kg/m2);smoking/ tobacco exposure;dyslipidemia     Objective:   Vital Signs: Vital signs are patient reported.  Today's Vitals   12/17/22 1547  Weight: 214 lb 15.2 oz (97.5 kg)  Height: 5\' 7"  (1.702 m)   Body mass index is 33.67 kg/m.     12/17/2022    3:46 PM 09/24/2022    1:07 PM 12/10/2021    9:27 AM 12/04/2020   11:11 AM 11/29/2019   11:13 AM 11/23/2018   11:10 AM 09/19/2017   10:34 AM  Advanced Directives  Does Patient Have a Medical Advance Directive? Yes Yes Yes Yes Yes Yes Yes  Type of Estate agent of [place];Living will Healthcare Power of [place];Living will Healthcare Power of [place];Living will Healthcare Power of [place];Living will Healthcare Power of [place];Living will Healthcare Power of [place];Living will Healthcare Power of [place];Living will  Does patient want to make changes to medical advance directive? No - Patient declined  No - Patient declined  No - Patient declined No - Patient declined No - Patient declined  Copy of Healthcare Power of Attorney in Chart? No - copy requested  No - copy requested No - copy requested No - copy requested No - copy  requested No - copy requested    Current Medications (verified) Outpatient Encounter Medications as of 12/17/2022  Medication Sig   Budeson-Glycopyrrol-Formoterol (BREZTRI AEROSPHERE) 160-9-4.8 MCG/ACT AERO Inhale 2 puffs into the lungs 2 (two) times daily. Rinse mouth out after use.   fluticasone (FLONASE) 50 MCG/ACT nasal spray Place 2 sprays into both nostrils daily.   montelukast (SINGULAIR) 10 MG tablet TAKE 1 TABLET BY MOUTH EVERYDAY AT BEDTIME   promethazine (PHENERGAN) 25 MG suppository PLACE 1 SUPPOSITORY (25 MG TOTAL) RECTALLY EVERY 8 (EIGHT) HOURS AS NEEDED FOR NAUSEA OR VOMITING.   rosuvastatin (CRESTOR) 20 MG tablet TAKE 1 TABLET BY MOUTH EVERY DAY   Vitamin D, Ergocalciferol, (DRISDOL) 1.25 MG (50000 UNIT) CAPS capsule TAKE 1 CAPSULE (50,000 UNITS TOTAL) BY MOUTH EVERY 7 (SEVEN) DAYS   albuterol (VENTOLIN HFA) 108 (90 Base) MCG/ACT inhaler TAKE 2 PUFFS BY MOUTH EVERY 6 HOURS AS NEEDED FOR WHEEZE OR SHORTNESS OF BREATH (Patient not taking: Reported on 12/17/2022)   benzonatate (TESSALON) 100 MG capsule Take 1 capsule (100 mg total) by mouth 3 (three) times daily as needed. (Patient not taking: Reported on 12/17/2022)   No facility-administered encounter medications on file as of 12/17/2022.    Allergies (verified) Latex and Morphine and codeine   History: Past Medical History:  Diagnosis Date   Allergy morphine 1986   Aortic atherosclerosis (HCC)    CT 2019   Emphysema of lung (HCC)    History of chicken pox    History of shingles    Osteoporosis  Past Surgical History:  Procedure Laterality Date   ABDOMINAL HYSTERECTOMY     AUGMENTATION MAMMAPLASTY Bilateral    Silicone, in front of muscle   BREAST BIOPSY     Patient has had 3 biopsy   BREAST ENHANCEMENT SURGERY  1975   COSMETIC SURGERY     TONSILLECTOMY     Family History  Problem Relation Age of Onset   Cancer Mother        Uterus   Alcohol abuse Father    Heart disease Father    Heart attack Son    Early  death Son    Heart disease Son    Colon polyps Neg Hx    Colon cancer Neg Hx    Esophageal cancer Neg Hx    Rectal cancer Neg Hx    Stomach cancer Neg Hx    Social History   Socioeconomic History   Marital status: Married    Spouse name: Not on file   Number of children: Not on file   Years of education: Not on file   Highest education level: Bachelor's degree (e.g., BA, AB, BS)  Occupational History   Not on file  Tobacco Use   Smoking status: Former    Current packs/day: 0.00    Average packs/day: 2.0 packs/day for 50.0 years (100.0 ttl pk-yrs)    Types: Cigarettes    Start date: 07/01/1966    Quit date: 07/01/2016    Years since quitting: 6.4   Smokeless tobacco: Never  Vaping Use   Vaping status: Never Used  Substance and Sexual Activity   Alcohol use: No   Drug use: No   Sexual activity: Yes    Birth control/protection: Surgical  Other Topics Concern   Not on file  Social History Narrative   Not on file   Social Determinants of Health   Financial Resource Strain: Low Risk  (12/10/2022)   Overall Financial Resource Strain (CARDIA)    Difficulty of Paying Living Expenses: Not hard at all  Food Insecurity: No Food Insecurity (12/10/2022)   Hunger Vital Sign    Worried About Running Out of Food in the Last Year: Never true    Ran Out of Food in the Last Year: Never true  Transportation Needs: No Transportation Needs (12/10/2022)   PRAPARE - Administrator, Civil Service (Medical): No    Lack of Transportation (Non-Medical): No  Physical Activity: Insufficiently Active (12/10/2022)   Exercise Vital Sign    Days of Exercise per Week: 3 days    Minutes of Exercise per Session: 40 min  Stress: No Stress Concern Present (12/10/2022)   [employer] of Occupational Health - Occupational Stress Questionnaire    Feeling of Stress : Not at all  Social Connections: Unknown (12/10/2022)   Social Connection and Isolation Panel [NHANES]    Frequency of Communication  with Friends and Family: More than three times a week    Frequency of Social Gatherings with Friends and Family: Twice a week    Attends Religious Services: Patient declined    Database administrator or Organizations: No    Attends Engineer, structural: Never    Marital Status: Married    Tobacco Counseling Counseling given: Not Answered   Clinical Intake:  Pre-visit preparation completed: Yes  Pain : No/denies pain  BMI - recorded: 33.67 Nutritional Status: BMI > 30  Obese Nutritional Risks: None Diabetes: No  How often do you need to have someone help you when  you read instructions, pamphlets, or other written materials from your doctor or pharmacy?: 1 - Never  Interpreter Needed?: No  Information entered by :: Donne Anon, CMA   Activities of Daily Living    12/10/2022   11:49 AM  In your present state of health, do you have any difficulty performing the following activities:  Hearing? 0  Vision? 0  Difficulty concentrating or making decisions? 0  Walking or climbing stairs? 1  Dressing or bathing? 0  Doing errands, shopping? 0  Preparing Food and eating ? N  Using the Toilet? N  In the past six months, have you accidently leaked urine? Y  Do you have problems with loss of bowel control? N  Managing your Medications? N  Managing your Finances? N  Housekeeping or managing your Housekeeping? N    Patient Care Team: Sharlene Dory, DO as PCP - General (Family Medicine)  Indicate any recent Medical Services you may have received from other than [facility] providers in the past year (date may be approximate).     Assessment:   This is a routine wellness examination for [facility].  Hearing/Vision screen No results found.  Dietary issues and exercise activities discussed:     Goals Addressed   None    Depression Screen    12/17/2022    3:50 PM 12/09/2022    3:35 PM 09/24/2022    2:32 PM 12/28/2021   10:05 AM 12/10/2021    9:29 AM 12/04/2020    11:14 AM 11/29/2019   11:17 AM  PHQ 2/9 Scores  PHQ - 2 Score 0 0 0 0 0 0 0  PHQ- 9 Score  1 1 0       Fall Risk    12/14/2022   12:20 PM 12/10/2022   11:49 AM 12/09/2022   11:38 AM 12/07/2022   10:54 AM 12/02/2022   11:03 AM  Fall Risk   Falls in the past year? 0 0 0 0 0  Number falls in past yr: 0 0 0 0 0  Injury with Fall? 0 0 0 0 0  Risk for fall due to : No Fall Risks No Fall Risks No Fall Risks No Fall Risks No Fall Risks  Follow up Falls evaluation completed Falls evaluation completed Falls evaluation completed Falls evaluation completed Falls evaluation completed    MEDICARE RISK AT HOME:   TIMED UP AND GO:  Was the test performed?  No    Cognitive Function:        12/17/2022    3:53 PM 12/10/2021    9:36 AM  6CIT Screen  What Year? 0 points 0 points  What month? 0 points 0 points  What time? 0 points 0 points  Count back from 20 0 points 0 points  Months in reverse 0 points 0 points  Repeat phrase 0 points 0 points  Total Score 0 points 0 points    Immunizations Immunization History  Administered Date(s) Administered   Moderna Sars-Covid-2 Vaccination 06/13/2019, 07/11/2019   Pneumococcal Conjugate-13 08/19/2016   Pneumococcal Polysaccharide-23 01/08/2019   Td 11/07/2008   Tdap 12/28/2021   Zoster Recombinant(Shingrix) 08/19/2016, 03/03/2017    TDAP status: Up to date  Flu Vaccine status: Due, Education has been provided regarding the importance of this vaccine. Advised may receive this vaccine at local pharmacy or Health Dept. Aware to provide a copy of the vaccination record if obtained from local pharmacy or Health Dept. Verbalized acceptance and understanding.  Pneumococcal vaccine status: Up to date  Covid-19 vaccine status: Information provided on how to obtain vaccines.   Qualifies for Shingles Vaccine? Yes   Zostavax completed No   Shingrix Completed?: Yes  Screening Tests Health Maintenance  Topic Date Due   Medicare Annual Wellness (AWV)   12/11/2022   INFLUENZA VACCINE  12/09/2022   COVID-19 Vaccine (3 - 2023-24 season) 03/01/2023 (Originally 01/08/2022)   Lung Cancer Screening  08/17/2023   DTaP/Tdap/Td (3 - Td or Tdap) 12/29/2031   Pneumonia Vaccine 73+ Years old  Completed   DEXA SCAN  Completed   Hepatitis C Screening  Completed   Zoster Vaccines- Shingrix  Completed   HPV VACCINES  Aged Out   Colonoscopy  Discontinued    Health Maintenance  Health Maintenance Due  Topic Date Due   Medicare Annual Wellness (AWV)  12/11/2022   INFLUENZA VACCINE  12/09/2022    Colorectal cancer screening: No longer required.   Mammogram status: Completed 03/03/22. Repeat every year  Bone Density status: Completed 02/03/21. Results reflect: Bone density results: OSTEOPOROSIS. Repeat every 2 years.  Lung Cancer Screening: (Low Dose CT Chest recommended if Age 63-80 years, 20 pack-year currently smoking OR have quit w/in 15years.) does qualify.   Lung Cancer Screening Referral: last scan completed on 08/17/22  Additional Screening:  Hepatitis C Screening: does qualify; Completed 05/10/78  Vision Screening: Recommended annual ophthalmology exams for early detection of glaucoma and other disorders of the eye. Is the patient up to date with their annual eye exam?  No  Who is the provider or what is the name of the office in which the patient attends annual eye exams? Dr. Arville Lime If pt is not established with a provider, would they like to be referred to a provider to establish care? No .   Dental Screening: Recommended annual dental exams for proper oral hygiene  Diabetic Foot Exam: N/a  Community Resource Referral / Chronic Care Management: CRR required this visit?  No   CCM required this visit?  No     Plan:     I have personally reviewed and noted the following in the patient's chart:   Medical and social history Use of alcohol, tobacco or illicit drugs  Current medications and supplements including opioid  prescriptions. Patient is not currently taking opioid prescriptions. Functional ability and status Nutritional status Physical activity Advanced directives List of other physicians Hospitalizations, surgeries, and ER visits in previous 12 months Vitals Screenings to include cognitive, depression, and falls Referrals and appointments  In addition, I have reviewed and discussed with patient certain preventive protocols, quality metrics, and best practice recommendations. A written personalized care plan for preventive services as well as general preventive health recommendations were provided to patient.     Donne Anon, CMA   12/17/2022   After Visit Summary: (MyChart) Due to this being a telephonic visit, the after visit summary with patients personalized plan was offered to patient via MyChart   Nurse Notes: None

## 2022-12-21 ENCOUNTER — Encounter (HOSPITAL_COMMUNITY)
Admission: RE | Admit: 2022-12-21 | Discharge: 2022-12-21 | Disposition: A | Discharge status: Home / Self Care | Payer: No Typology Code available for payment source | Source: Ambulatory Visit | Attending: Student

## 2022-12-21 DIAGNOSIS — J449 Chronic obstructive pulmonary disease, unspecified: Secondary | ICD-10-CM | POA: Diagnosis not present

## 2022-12-21 NOTE — Progress Notes (Signed)
Daily Session Note  Patient Details  Name: Kelly Henderson MRN: [id number] Date of Birth: 1945-09-16 Referring Provider:   Doristine Henderson Pulmonary Rehab Walk Test from 09/24/2022 in [facility]  Referring Provider Kelly Henderson       Encounter Date: 12/21/2022  Check In:  Session Check In - 12/21/22 1101       Check-In   Supervising physician immediately available to respond to emergencies [facility] MD immediately available    Physician(s) Kelly Favre, PA    Location [facility]    Staff Present Raford Pitcher, MS, ACSM-CEP, Exercise Physiologist; Dionisio Paschal, ACSM-CEP, Exercise Physiologist;Samantha Belarus, RD, Dutch Gray, RN, Fuller Plan, RT    Virtual Visit No    Medication changes reported     No    Fall or balance concerns reported    No    Tobacco Cessation No Change    Warm-up and Cool-down Performed as group-led instruction    Resistance Training Performed Yes    VAD Patient? No    PAD/SET Patient? No      Pain Assessment   Currently in Pain? No/denies    Multiple Pain Sites No             Capillary Blood Glucose: No results found for this or any previous visit (from the past 24 hour(s)).    Social History   Tobacco Use  Smoking Status Former   Current packs/day: 0.00   Average packs/day: 2.0 packs/day for 50.0 years (100.0 ttl pk-yrs)   Types: Cigarettes   Start date: 07/01/1966   Quit date: 07/01/2016   Years since quitting: 6.4  Smokeless Tobacco Never    Goals Met:  Independence with exercise equipment Exercise tolerated well No report of concerns or symptoms today Strength training completed today  Goals Unmet:  Not Applicable  Comments: Service time is from 1013 to 1138.    Dr. Mechele Collin is Medical Director for Pulmonary Rehab at [facility].

## 2022-12-23 ENCOUNTER — Encounter (HOSPITAL_COMMUNITY)
Admission: RE | Admit: 2022-12-23 | Discharge: 2022-12-23 | Disposition: A | Discharge status: Home / Self Care | Payer: No Typology Code available for payment source | Source: Ambulatory Visit | Attending: Student | Admitting: Student

## 2022-12-23 DIAGNOSIS — J449 Chronic obstructive pulmonary disease, unspecified: Secondary | ICD-10-CM | POA: Diagnosis not present

## 2022-12-23 NOTE — Progress Notes (Signed)
Daily Session Note  Patient Details  Name: Kelly Henderson MRN: [id number] Date of Birth: [birth year] Referring Provider:   [name] Pulmonary Rehab Walk Test [facility]/24/20[facility] in [facility]  Referring Provider [place]       Encounter Date: 12/23/2022  Check In:  Session Check In - 12/23/22 1109       Check-In   Supervising physician immediately available to respond to emergencies [facility] MD immediately available    Physician(s) Edd Fabian, NP    Location [facility]    Staff Present Raford Pitcher, MS, ACSM-CEP, Exercise Physiologist; Dionisio Paschal, ACSM-CEP, Exercise Physiologist;Samantha Belarus, RD, Dutch Gray, RN, Fuller Plan, RT    Virtual Visit No    Medication changes reported     No    Fall or balance concerns reported    No    Tobacco Cessation No Change    Warm-up and Cool-down Performed as group-led instruction    Resistance Training Performed Yes    VAD Patient? No    PAD/SET Patient? No      Pain Assessment   Currently in Pain? No/denies    Multiple Pain Sites No             Capillary Blood Glucose: No results found for this or any previous visit (from the past 24 hour(s)).    Social History   Tobacco Use  Smoking Status Former   Current packs/day: 0.00   Average packs/day: 2.0 packs/day for 50.0 years (100.0 ttl pk-yrs)   Types: Cigarettes   Start date: 07/01/1966   Quit date: 07/01/2016   Years since quitting: 6.4  Smokeless Tobacco Never    Goals Met:  Proper associated with RPD/PD & O2 Sat Independence with exercise equipment Improved SOB with ADL's Exercise tolerated well Personal goals reviewed No report of concerns or symptoms today Strength training completed today  Goals Unmet:  Not Applicable  Comments: Pt graduated today with great success. Service time is from 1015 to 1141.    Dr. Mechele Collin is Medical Director for Pulmonary Rehab at  [facility]

## 2022-12-23 NOTE — Progress Notes (Signed)
   12/23/22 1209  6 Minute Walk  Phase Discharge  Distance 1090 feet  Distance % Change 96 %  Distance Feet Change 534 ft  Walk Time 6 minutes  # of Rest Breaks 1 (2:00-2:20 sec due to hypoxia)  MPH 2.06  METS 2.09  RPE 13  Perceived Dyspnea  2  VO2 Peak 7.32  Symptoms Yes (comment)  Comments Pt needed to increase to 6L O2 during walk test  Resting HR 86 bpm  Resting BP 114/62  Resting Oxygen Saturation  91 %  Exercise Oxygen Saturation  during 6 min walk 80 %  Max Ex. HR 112 bpm  Max Ex. BP 140/70  2 Minute Post BP 124/76  Interval HR  1 Minute HR 86  2 Minute HR 97  3 Minute HR 98  4 Minute HR 100  5 Minute HR 110  6 Minute HR 112  2 Minute Post HR 88  Interval Oxygen  Baseline Oxygen Saturation % 91 %  1 Minute Oxygen Saturation % 90 % (85 RA at :50)  1 Minute Liters of Oxygen 2 L  2 Minute Oxygen Saturation % 85 % (80 2L at 1:40 sec)  2 Minute Liters of Oxygen 4 L  3 Minute Oxygen Saturation % 88 % (83 4L at 2:00)  3 Minute Liters of Oxygen 6 L  4 Minute Oxygen Saturation % 88 %  4 Minute Liters of Oxygen 6 L  5 Minute Oxygen Saturation % 93 %  5 Minute Liters of Oxygen 6 L  6 Minute Oxygen Saturation % 92 %  6 Minute Liters of Oxygen 6 L  2 Minute Post Oxygen Saturation % 98 %  2 Minute Post Liters of Oxygen 6 L   Pt needs 6L O2 for exercise. Highly encouraged pt to agree to oxygen for home so she can exercise on her own. Graduated from pulmonary rehab today with great success on oxygen. Kelly Henderson BS, ACSM-CEP 12/23/2022 4:18 PM

## 2022-12-24 ENCOUNTER — Telehealth (HOSPITAL_BASED_OUTPATIENT_CLINIC_OR_DEPARTMENT_OTHER): Payer: Self-pay | Admitting: Pulmonary Disease

## 2022-12-24 NOTE — Progress Notes (Signed)
Discharge Progress Report  Patient Details  Name: Kelly Henderson MRN: [id number] Date of Birth: 1945/11/20 Referring Provider:   [name] Pulmonary Rehab Walk Test from 09/24/2022 in [facility]  Referring Provider Meier        Number of Visits: 21  Reason for Discharge:  Patient has met program and personal goals.  Smoking History:  Social History   Tobacco Use  Smoking Status Former   Current packs/day: 0.00   Average packs/day: 2.0 packs/day for 50.0 years (100.0 ttl pk-yrs)   Types: Cigarettes   Start date: 07/01/1966   Quit date: 07/01/2016   Years since quitting: 6.4  Smokeless Tobacco Never    Diagnosis:  Stage 2 moderate COPD by GOLD classification (HCC)  ADL [facility]:  Pulmonary Assessment Scores     Row Name 09/24/22 1324 12/09/22 1535 12/23/22 1233     ADL [facility]   ADL Phase Entry Exit --   SOB Score total 26 26 --     CAT Score   CAT Score 11 13 --     mMRC Score   mMRC Score 2 -- 2            Initial Exercise Prescription:  Initial Exercise Prescription - 09/24/22 1500       Date of Initial Exercise RX and Referring Provider   Date 09/24/22    Referring Provider Meier    Expected Discharge Date 12/23/22      Oxygen   Oxygen Continuous    Liters 4    Maintain Oxygen Saturation 88% or higher      Recumbant Bike   Level 1    RPM 20    Watts 40    Minutes 15    METs 2      Track   Minutes 15    METs 1.5      Prescription Details   Frequency (times per week) 2    Duration Progress to 30 minutes of continuous aerobic without signs/symptoms of physical distress      Intensity   THRR 40-80% of Max Heartrate 58-116    Ratings of Perceived Exertion 11-13    Perceived Dyspnea 0-4      Progression   Progression Continue to progress workloads to maintain intensity without signs/symptoms of physical distress.      Resistance Training   Training Prescription Yes     Weight red bands    Reps 10-15             Discharge Exercise Prescription (Final Exercise Prescription Changes):  Exercise Prescription Changes - 12/14/22 1200       Response to Exercise   Blood Pressure (Admit) 100/60    Blood Pressure (Exercise) 120/64    Blood Pressure (Exit) 108/66    Heart Rate (Admit) 82 bpm    Heart Rate (Exercise) 86 bpm    Heart Rate (Exit) 77 bpm    Oxygen Saturation (Admit) 94 %    Oxygen Saturation (Exercise) 95 %    Oxygen Saturation (Exit) 94 %    Rating of Perceived Exertion (Exercise) 11    Perceived Dyspnea (Exercise) 2    Duration Continue with 30 min of aerobic exercise without signs/symptoms of physical distress.    Intensity THRR unchanged      Progression   Progression Continue to progress workloads to maintain intensity without signs/symptoms of physical distress.      Paramedic Prescription  Yes    Weight Blue Bands    Reps 10-15    Time 10 Minutes      Oxygen   Oxygen Continuous    Liters 4-6      Recumbant Bike   Level 5    Minutes 15    METs 3.2      NuStep   Level 5    Minutes 15    METs 2.7      Oxygen   Maintain Oxygen Saturation 88% or higher             Functional Capacity:  6 Minute Walk     Row Name 09/24/22 1600 12/23/22 1209       6 Minute Walk   Phase Initial Discharge    Distance 556 feet 1090 feet    Distance % Change -- 96 %    Distance Feet Change -- 534 ft    Walk Time 6 minutes 6 minutes    # of Rest Breaks 2  stopped at 1:50 for desat, 5:27 for desat 1  2:00-2:20 sec due to hypoxia    MPH 1.05 2.06    METS 1.28 2.09    RPE 12 13    Perceived Dyspnea  2 2    VO2 Peak 4.49 7.32    Symptoms Yes (comment) Yes (comment)    Comments DOE Pt needed to increase to 6L O2 during walk test    Resting HR 78 bpm 86 bpm    Resting BP 142/80 114/62    Resting Oxygen Saturation  90 % 91 %    Exercise Oxygen Saturation  during 6 min walk 79 % 80 %    Max Ex. HR 105 bpm  112 bpm    Max Ex. BP 174/98 140/70    2 Minute Post BP 142/76 124/76      Interval HR   1 Minute HR 79 86    2 Minute HR 96 97    3 Minute HR 83 98    4 Minute HR 85 100    5 Minute HR 105 110    6 Minute HR 96 112    2 Minute Post HR 81 88    Interval Heart Rate? Yes --      Interval Oxygen   Interval Oxygen? Yes --    Baseline Oxygen Saturation % 90 % 91 %    1 Minute Oxygen Saturation % 89 %  stopped at 1:50 O2 80%, applied 2L O2 90 %  85 RA at :50    1 Minute Liters of Oxygen 0 L 2 L    2 Minute Oxygen Saturation % 79 %  stopped at 2:20 to increase O2 and pt began PLB 85 %  80 2L at 1:40 sec    2 Minute Liters of Oxygen 2 L 4 L    3 Minute Oxygen Saturation % 92 % 88 %  83 4L at 2:00    3 Minute Liters of Oxygen 3 L 6 L    4 Minute Oxygen Saturation % 88 %  4:21 stopped pt d/t low O2 at 84%, pt able to PLB and O2>88% 88 %    4 Minute Liters of Oxygen 3 L 6 L    5 Minute Oxygen Saturation % 84 %  5:27 resumed walk test, O2 90% on 4L 93 %    5 Minute Liters of Oxygen 4 L 6 L    6 Minute Oxygen Saturation % 93 %  92 %    6 Minute Liters of Oxygen 4 L 6 L    2 Minute Post Oxygen Saturation % 96 %  5 min post, 91% room air 98 %    2 Minute Post Liters of Oxygen 4 L 6 L             Psychological, QOL, Others - Outcomes: PHQ 2/9:    12/17/2022    3:50 PM 12/09/2022    3:35 PM 09/24/2022    2:32 PM 12/28/2021   10:05 AM 12/10/2021    9:29 AM  Depression screen PHQ 2/9  Decreased Interest 0 0 0 0 0  Down, Depressed, Hopeless 0 0 0 0 0  PHQ - 2 Score 0 0 0 0 0  Altered sleeping  0 0 0   Tired, decreased energy  1 1 0   Change in appetite  0 0 0   Feeling bad or failure about yourself   0 0 0   Trouble concentrating  0 0 0   Moving slowly or fidgety/restless  0 0 0   Suicidal thoughts  0 0 0   PHQ-9 Score  1 1 0   Difficult doing work/chores  Not difficult at all Not difficult at all Not difficult at all     Quality of Life:   Personal Goals: Goals established at  orientation with interventions provided to work toward goal.  Personal Goals and Risk Factors at Admission - 09/24/22 1316       Core Components/Risk Factors/Patient Goals on Admission    Weight Management Yes;Weight Loss    Intervention Weight Management: Develop a combined nutrition and exercise program designed to reach desired caloric intake, while maintaining appropriate intake of nutrient and fiber, sodium and fats, and appropriate energy expenditure required for the weight goal.;Weight Management: Provide education and appropriate resources to help participant work on and attain dietary goals.;Weight Management/Obesity: Establish reasonable short term and long term weight goals.;Obesity: Provide education and appropriate resources to help participant work on and attain dietary goals.    Expected Outcomes Short Term: Continue to assess and modify interventions until short term weight is achieved;Long Term: Adherence to nutrition and physical activity/exercise program aimed toward attainment of established weight goal;Weight Maintenance: Understanding of the daily nutrition guidelines, which includes 25-35% calories from fat, 7% or less cal from saturated fats, less than 200mg  cholesterol, less than 1.5gm of sodium, & 5 or more servings of fruits and vegetables daily;Weight Loss: Understanding of general recommendations for a balanced deficit meal plan, which promotes 1-2 lb weight loss per week and includes a negative energy balance of [phone] kcal/d;Understanding recommendations for meals to include 15-35% energy as protein, 25-35% energy from fat, 35-60% energy from carbohydrates, less than 200mg  of dietary cholesterol, 20-35 gm of total fiber daily;Understanding of distribution of calorie intake throughout the day with the consumption of 4-5 meals/snacks    Improve shortness of breath with ADL's Yes    Intervention Provide education, individualized exercise plan and daily activity instruction to  help decrease symptoms of SOB with activities of daily living.    Expected Outcomes Short Term: Improve cardiorespiratory fitness to achieve a reduction of symptoms when performing ADLs;Long Term: Be able to perform more ADLs without symptoms or delay the onset of symptoms              Personal Goals Discharge:  Goals and Risk Factor Review     Row Name 10/18/22 [phone] 11/15/22 1004 12/10/22 0956 12/24/22 [phone]  Core Components/Risk Factors/Patient Goals Review   Personal Goals Review Weight Management/Obesity;Improve shortness of breath with ADL's;Stress Weight Management/Obesity;Improve shortness of breath with ADL's;Stress Weight Management/Obesity;Improve shortness of breath with ADL's;Stress Weight Management/Obesity;Improve shortness of breath with ADL's;Stress    Review Kelly Henderson has attended 4 sessions so far. She has not made any progress with weight loss yet, but she is working with staff dietician to achieve this goal. Kelly Henderson still has shortness of breath with ADL's.This could be partly due to her needing oxygen at home, but refusing to have it ordered. Kelly Henderson is dealing with stress over her declining health. Staff will work with Kelly Henderson on healthy ways to decrease her stress. Pt. will benefit from participation in PR for nutrition, exercise and lifestyle modification. Goals in action for weight loss. Kelly Henderson has not made progress of her goal of weight loss. She is working with our dietician on changes she can make to be successful. Goal progressing on reducing stress. She contributes the weight gain to stress and her decline in health. Staff have reiterated several times to Kelly Henderson that she needs to be wearing oxygen continuously. 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 Resources have been provided to Kelly Henderson for stress reduction and management. She currently declines a referral to a mental health  professional. Goal not progressing for improving her shortness of breath with ADLs. Kelly Henderson needs 4-6L on exertion while exercising. Though oxygen has been ordered for her, she adamantly refuses to pick up her oxygen at the [employer]. Her pulmonologist is aware of her refusal. Kelly Henderson does enjoy the program, she is making progress but is being held back due to her refusing to wear home oxygen. Goal progressing for weight loss. Kelly Henderson has loss weight since starting the program, but has not met her goal yet. She is working with staff dietician to obtain her weight loss goals. Goal progressing on reducing stress. She does not mention any new stressors, but her health still remains a stressor for her. Goal not progressing for improving shortness of breath with ADL's. Kelly Henderson still refuses to wear oxygen outside of PR. Staff has educated her multiple times on the importance of wearing oxygen. We will continue to monitor her progress throughout the program. Kelly Henderson graduated from the Pulmonary Rehab program on 12/23/22. She met her goal on weight loss. Her weight was down 7.9#'s since starting the program. Goal on stress progressing. Goal on improving shortness of breath with ADL's prgressing.    Expected Outcomes See admission goals See admission goals See admission goals To continue to exercise and modify her nutrition and lifestyle post graduation             Exercise Goals and Review:  Exercise Goals     Row Name 09/24/22 1331 10/11/22 1205 11/12/22 0835 12/03/22 1315       Exercise Goals   Increase Physical Activity Yes Yes Yes Yes    Intervention Provide advice, education, support and counseling about physical activity/exercise needs.;Develop an individualized exercise prescription for aerobic and resistive training based on initial evaluation findings, risk stratification, comorbidities and participant's personal goals. Provide advice, education, support and counseling about  physical activity/exercise needs.;Develop an individualized exercise prescription for aerobic and resistive training based on initial evaluation findings, risk stratification, comorbidities and participant's personal goals. Provide advice, education, support and counseling about physical activity/exercise  needs.;Develop an individualized exercise prescription for aerobic and resistive training based on initial evaluation findings, risk stratification, comorbidities and participant's personal goals. Provide advice, education, support and counseling about physical activity/exercise needs.;Develop an individualized exercise prescription for aerobic and resistive training based on initial evaluation findings, risk stratification, comorbidities and participant's personal goals.    Expected Outcomes Short Term: Attend rehab on a regular basis to increase amount of physical activity.;Long Term: Add in home exercise to make exercise part of routine and to increase amount of physical activity.;Long Term: Exercising regularly at least 3-5 days a week. Short Term: Attend rehab on a regular basis to increase amount of physical activity.;Long Term: Add in home exercise to make exercise part of routine and to increase amount of physical activity.;Long Term: Exercising regularly at least 3-5 days a week. Short Term: Attend rehab on a regular basis to increase amount of physical activity.;Long Term: Add in home exercise to make exercise part of routine and to increase amount of physical activity.;Long Term: Exercising regularly at least 3-5 days a week. Short Term: Attend rehab on a regular basis to increase amount of physical activity.;Long Term: Add in home exercise to make exercise part of routine and to increase amount of physical activity.;Long Term: Exercising regularly at least 3-5 days a week.    Increase Strength and Stamina Yes Yes Yes Yes    Intervention Provide advice, education, support and counseling about physical  activity/exercise needs.;Develop an individualized exercise prescription for aerobic and resistive training based on initial evaluation findings, risk stratification, comorbidities and participant's personal goals. Provide advice, education, support and counseling about physical activity/exercise needs.;Develop an individualized exercise prescription for aerobic and resistive training based on initial evaluation findings, risk stratification, comorbidities and participant's personal goals. Provide advice, education, support and counseling about physical activity/exercise needs.;Develop an individualized exercise prescription for aerobic and resistive training based on initial evaluation findings, risk stratification, comorbidities and participant's personal goals. Provide advice, education, support and counseling about physical activity/exercise needs.;Develop an individualized exercise prescription for aerobic and resistive training based on initial evaluation findings, risk stratification, comorbidities and participant's personal goals.    Expected Outcomes Short Term: Increase workloads from initial exercise prescription for resistance, speed, and METs.;Short Term: Perform resistance training exercises routinely during rehab and add in resistance training at home;Long Term: Improve cardiorespiratory fitness, muscular endurance and strength as measured by increased METs and functional capacity ( ) Short Term: Increase workloads from initial exercise prescription for resistance, speed, and METs.;Short Term: Perform resistance training exercises routinely during rehab and add in resistance training at home;Long Term: Improve cardiorespiratory fitness, muscular endurance and strength as measured by increased METs and functional capacity ( ) Short Term: Increase workloads from initial exercise prescription for resistance, speed, and METs.;Short Term: Perform resistance training exercises routinely during rehab  and add in resistance training at home;Long Term: Improve cardiorespiratory fitness, muscular endurance and strength as measured by increased METs and functional capacity ( ) Short Term: Increase workloads from initial exercise prescription for resistance, speed, and METs.;Short Term: Perform resistance training exercises routinely during rehab and add in resistance training at home;Long Term: Improve cardiorespiratory fitness, muscular endurance and strength as measured by increased METs and functional capacity ( )    Able to understand and use rate of perceived exertion (RPE) scale Yes Yes Yes Yes    Intervention Provide education and explanation on how to use RPE scale Provide education and explanation on how to use RPE scale Provide education and explanation on how to use RPE scale  Provide education and explanation on how to use RPE scale    Expected Outcomes Short Term: Able to use RPE daily in rehab to express subjective intensity level;Long Term:  Able to use RPE to guide intensity level when exercising independently Short Term: Able to use RPE daily in rehab to express subjective intensity level;Long Term:  Able to use RPE to guide intensity level when exercising independently Short Term: Able to use RPE daily in rehab to express subjective intensity level;Long Term:  Able to use RPE to guide intensity level when exercising independently Short Term: Able to use RPE daily in rehab to express subjective intensity level;Long Term:  Able to use RPE to guide intensity level when exercising independently    Able to understand and use Dyspnea scale Yes Yes Yes Yes    Intervention Provide education and explanation on how to use Dyspnea scale Provide education and explanation on how to use Dyspnea scale Provide education and explanation on how to use Dyspnea scale Provide education and explanation on how to use Dyspnea scale    Expected Outcomes Short Term: Able to use Dyspnea scale daily in rehab to  express subjective sense of shortness of breath during exertion;Long Term: Able to use Dyspnea scale to guide intensity level when exercising independently Short Term: Able to use Dyspnea scale daily in rehab to express subjective sense of shortness of breath during exertion;Long Term: Able to use Dyspnea scale to guide intensity level when exercising independently Short Term: Able to use Dyspnea scale daily in rehab to express subjective sense of shortness of breath during exertion;Long Term: Able to use Dyspnea scale to guide intensity level when exercising independently Short Term: Able to use Dyspnea scale daily in rehab to express subjective sense of shortness of breath during exertion;Long Term: Able to use Dyspnea scale to guide intensity level when exercising independently    Knowledge and understanding of Target Heart Rate Range (THRR) Yes Yes Yes Yes    Intervention Provide education and explanation of THRR including how the numbers were predicted and where they are located for reference Provide education and explanation of THRR including how the numbers were predicted and where they are located for reference Provide education and explanation of THRR including how the numbers were predicted and where they are located for reference Provide education and explanation of THRR including how the numbers were predicted and where they are located for reference    Expected Outcomes Short Term: Able to state/look up THRR;Long Term: Able to use THRR to govern intensity when exercising independently;Short Term: Able to use daily as guideline for intensity in rehab Short Term: Able to state/look up THRR;Long Term: Able to use THRR to govern intensity when exercising independently;Short Term: Able to use daily as guideline for intensity in rehab Short Term: Able to state/look up THRR;Long Term: Able to use THRR to govern intensity when exercising independently;Short Term: Able to use daily as guideline for intensity  in rehab Short Term: Able to state/look up THRR;Long Term: Able to use THRR to govern intensity when exercising independently;Short Term: Able to use daily as guideline for intensity in rehab    Understanding of Exercise Prescription Yes Yes Yes Yes    Intervention Provide education, explanation, and written materials on patient's individual exercise prescription Provide education, explanation, and written materials on patient's individual exercise prescription Provide education, explanation, and written materials on patient's individual exercise prescription Provide education, explanation, and written materials on patient's individual exercise prescription    Expected  Outcomes Short Term: Able to explain program exercise prescription;Long Term: Able to explain home exercise prescription to exercise independently Short Term: Able to explain program exercise prescription;Long Term: Able to explain home exercise prescription to exercise independently Short Term: Able to explain program exercise prescription;Long Term: Able to explain home exercise prescription to exercise independently Short Term: Able to explain program exercise prescription;Long Term: Able to explain home exercise prescription to exercise independently             Exercise Goals Re-Evaluation:  Exercise Goals Re-Evaluation     Row Name 10/11/22 1205 11/12/22 0835 12/03/22 1315 12/23/22 1540       Exercise Goal Re-Evaluation   Exercise Goals Review Increase Physical Activity;Able to understand and use Dyspnea scale;Understanding of Exercise Prescription;Increase Strength and Stamina;Knowledge and understanding of Target Heart Rate Range (THRR);Able to understand and use rate of perceived exertion (RPE) scale Increase Physical Activity;Able to understand and use Dyspnea scale;Understanding of Exercise Prescription;Increase Strength and Stamina;Knowledge and understanding of Target Heart Rate Range (THRR);Able to understand and use  rate of perceived exertion (RPE) scale Increase Physical Activity;Able to understand and use Dyspnea scale;Understanding of Exercise Prescription;Increase Strength and Stamina;Knowledge and understanding of Target Heart Rate Range (THRR);Able to understand and use rate of perceived exertion (RPE) scale Increase Physical Activity;Able to understand and use Dyspnea scale;Understanding of Exercise Prescription;Increase Strength and Stamina;Knowledge and understanding of Target Heart Rate Range (THRR);Able to understand and use rate of perceived exertion (RPE) scale    Comments Kelly Henderson has completed 1 exercise session. She exercises for 15 min on the track and recumbent bike. Pt averaged 2.38 METs on the track and 2 METs at 1 on the recumbent bike. She performs the warmup and cooldown standing without limitations. It is too soon to notate any discernable progressions. Will continue to monitor and progress as able. Kelly Henderson has completed 1 exercise session. She exercises for 15 min on the Nustep and recumbent bike. Pt averaged 2.38 METs on the track and 2 METs at 4 on the recumbent bike. She performs the warmup and cooldown standing without limitations. Kelly Henderson has transitioned from the track to the Nustep due to an injury to her foot. She tolerated the Nustep better as she has increased her workload. She has also increased her workload on the recumbent bike. I have tried to talk to Kelly Henderson about exercising at home, but she is still refusing oxygen. She does need oxygen to exercise. Will continue to monitor and progress as able. Kelly Henderson has completed 16 exercise sessions. She exercises for 15 min on the Nustep and recumbent bike. Pt averages 2.8 METs at level 5 on the Nustep and 3.1 METs at level 4 on recumbent bike. She performs the warmup and cooldown standing without limitations. Kelly Henderson has increased her workload for the Nustep as METs have slightly increased. Her workload for the recumbent bike has remained the same  but will encourage her to increase. I am still unable to do a home ExRx with Kelly Henderson because she does not want home O2. She understand the benefits of exercising with O2. I am unsure if she understands how much she needs O2. Will continue to monitor and progress able. Kelly Henderson has completed 21 exercise sessions. She has progressed from 2.0 to 3.3 METs. Her workload has also increased from level 1 to 5. Kelly Henderson has tolerated PR well and feels that it has benefited her. She wants to exercise at the [facility].I discussed the need  for supplemental oxygen during exercise. Pt stated that she will stop exercising when desatting. Kelly Henderson mentioned calling the pulmonologist if she sees that she cannot exercise. Will notify pulmonologist for desats.    Expected Outcomes Through exercise at rehab and home, the patient will decrease shortness of breath with daily activities and feel confident carrying out an exercise regimen at home. Through exercise at rehab and home, the patient will decrease shortness of breath with daily activities and feel confident carrying out an exercise regimen at home. Through exercise at rehab and home, the patient will decrease shortness of breath with daily activities and feel confident carrying out an exercise regimen at home. Through exercise at rehab and home, the patient will decrease shortness of breath with daily activities and feel confident carrying out an exercise regimen at home.             Nutrition & Weight - Outcomes:   Post Biometrics - 12/23/22 1233        Post  Biometrics   Henderson Strength 29 kg             Nutrition:  Nutrition Therapy & Goals - 12/23/22 0907       Nutrition Therapy   Diet Heart healthy diet    Drug/Food Interactions Statins/Certain Fruits      Personal Nutrition Goals   Nutrition Goal Patient to improve diet quality by using the plate method as a guide for meal planning to include lean protein/plant protein, fruits, vegetables, whole  grains, nonfat dairy as part of a well-balanced diet.   goal in action.   Personal Goal #2 Patient to identify strategies for weight loss of 0.5-2.0# per week.   goal in action.   Comments Goals in action. Kelly Henderson reports motivation to lose weight; she reports weight gain of >50# over the last year. She is down 7.9# since starting with our program. She has applied many strategies for weight loss including mindful eating strategies, high fiber/high protein food choices, etc. She continues to enjoy seafood and vegetables regularly. Kelly Henderson will benefit from participation in pulmonary rehab for nutrition, exercise, and lifestyle modification.      Intervention Plan   Intervention Prescribe, educate and counsel regarding individualized specific dietary modifications aiming towards targeted core components such as weight, hypertension, lipid management, diabetes, heart failure and other comorbidities.;Nutrition handout(s) given to patient.    Expected Outcomes Short Term Goal: Understand basic principles of dietary content, such as calories, fat, sodium, cholesterol and nutrients.;Long Term Goal: Adherence to prescribed nutrition plan.;Short Term Goal: A plan has been developed with personal nutrition goals set during dietitian appointment.             Nutrition Discharge:  Nutrition Assessments - 12/09/22 1048       Rate Your Plate Scores   Post Score 75             Education Questionnaire Score:  Knowledge Questionnaire Score - 12/09/22 1535       Knowledge Questionnaire Score   Post Score 18/18             Goals reviewed with patient; copy given to patient.

## 2022-12-24 NOTE — Telephone Encounter (Signed)
Pulmonary Rehab contacted Pulmonary rehab director regarding hypoxemia during . Please order 6L with activity for patient based on testing from 12/23/22.      12/23/22 1209  6 Minute Walk  Phase Discharge  Distance 1090 feet  Distance % Change 96 %  Distance Feet Change 534 ft  Walk Time 6 minutes  # of Rest Breaks 1 (2:00-2:20 sec due to hypoxia)  MPH 2.06  METS 2.09  RPE 13  Perceived Dyspnea  2  VO2 Peak 7.32  Symptoms Yes (comment)  Comments Pt needed to increase to 6L O2 during walk test  Resting HR 86 bpm  Resting BP 114/62  Resting Oxygen Saturation  91 %  Exercise Oxygen Saturation  during 6 min walk 80 %  Max Ex. HR 112 bpm  Max Ex. BP 140/70  2 Minute Post BP 124/76  Interval HR  1 Minute HR 86  2 Minute HR 97  3 Minute HR 98  4 Minute HR 100  5 Minute HR 110  6 Minute HR 112  2 Minute Post HR 88  Interval Oxygen  Baseline Oxygen Saturation % 91 %  1 Minute Oxygen Saturation % 90 % (85 RA at :50)  1 Minute Liters of Oxygen 2 L  2 Minute Oxygen Saturation % 85 % (80 2L at 1:40 sec)  2 Minute Liters of Oxygen 4 L  3 Minute Oxygen Saturation % 88 % (83 4L at 2:00)  3 Minute Liters of Oxygen 6 L  4 Minute Oxygen Saturation % 88 %  4 Minute Liters of Oxygen 6 L  5 Minute Oxygen Saturation % 93 %  5 Minute Liters of Oxygen 6 L  6 Minute Oxygen Saturation % 92 %  6 Minute Liters of Oxygen 6 L  2 Minute Post Oxygen Saturation % 98 %  2 Minute Post Liters of Oxygen 6 L    Pt needs 6L O2 for exercise. Highly encouraged pt to agree to oxygen for home so she can exercise on her own. Graduated from pulmonary rehab today with great success on oxygen. Ethelda Chick BS, ACSM-CEP 12/23/2022 4:18 PM

## 2022-12-27 ENCOUNTER — Other Ambulatory Visit: Payer: Self-pay

## 2022-12-27 DIAGNOSIS — J439 Emphysema, unspecified: Secondary | ICD-10-CM

## 2022-12-27 DIAGNOSIS — R0600 Dyspnea, unspecified: Secondary | ICD-10-CM

## 2022-12-27 DIAGNOSIS — J449 Chronic obstructive pulmonary disease, unspecified: Secondary | ICD-10-CM

## 2022-12-27 NOTE — Telephone Encounter (Signed)
Order has been placed for 02. If you can just get her scheduled for a apt Kelly Henderson

## 2023-01-10 ENCOUNTER — Other Ambulatory Visit: Payer: Self-pay | Admitting: Family Medicine

## 2023-01-10 DIAGNOSIS — J439 Emphysema, unspecified: Secondary | ICD-10-CM

## 2023-01-31 ENCOUNTER — Encounter: Payer: Self-pay | Admitting: Primary Care

## 2023-01-31 ENCOUNTER — Other Ambulatory Visit: Payer: Self-pay | Admitting: Primary Care

## 2023-01-31 ENCOUNTER — Ambulatory Visit (INDEPENDENT_AMBULATORY_CARE_PROVIDER_SITE_OTHER): Payer: No Typology Code available for payment source | Admitting: Primary Care

## 2023-01-31 VITALS — BP 116/74 | HR 75 | Temp 98.0°F | Ht 67.0 in | Wt 217.8 lb

## 2023-01-31 DIAGNOSIS — Z87891 Personal history of nicotine dependence: Secondary | ICD-10-CM | POA: Diagnosis not present

## 2023-01-31 DIAGNOSIS — J9611 Chronic respiratory failure with hypoxia: Secondary | ICD-10-CM | POA: Diagnosis not present

## 2023-01-31 DIAGNOSIS — R918 Other nonspecific abnormal finding of lung field: Secondary | ICD-10-CM | POA: Diagnosis not present

## 2023-01-31 NOTE — Patient Instructions (Addendum)
Your oxygen dropped to 81% RA, needs 2L continuous or 5L POC to maintain O2 level <90%   We recommend getting you oxygen for use at home as needed, please let us know if you change your mind and want an order placed   Recommendations: - Continue Breztri Aerosphere- two puffs morning and evening - Use Albuterol 2 puffs every 4-6 hours as needed for breakthrough shortness of breath/wheezing  - Continue Singulair 10mg  at bedtime - Take mucinex 600mg  tablet twice daily to loosen congestion (or robitussin) - Due for follow-up CT imaging of your chest in October   Follow-up: - 3 months with Dr. Francine Graven Hunsucker or Celine Mans (30 mins- former Dr. Thora Lance- COPD/Chronic respiratory failure)    Chronic Obstructive Pulmonary Disease  Chronic obstructive pulmonary disease (COPD) is a long-term (chronic) lung problem. When you have COPD, it is hard for air to get in and out of your lungs. Usually the condition gets worse over time, and your lungs will never return to normal. There are things you can do to keep yourself as healthy as possible. What are the causes? Smoking. This is the most common cause. Certain genes passed from parent to child (inherited). What increases the risk? Being exposed to secondhand smoke from cigarettes, pipes, or cigars. Being exposed to chemicals and other irritants, such as fumes and dust in the work environment. Having chronic lung conditions or infections. What are the signs or symptoms? Shortness of breath, especially during physical activity. A long-term cough with a large amount of thick mucus. Sometimes, the cough may not have any mucus (dry cough). Wheezing. Breathing quickly. Skin that looks gray or blue, especially in the fingers, toes, or lips. Feeling tired (fatigue). Weight loss. Chest tightness. Having infections often. Episodes when breathing symptoms become much worse (exacerbations). At the later stages of this disease, you may have swelling in the  ankles, feet, or legs. How is this treated? Taking medicines. Quitting smoking, if you smoke. Rehabilitation. This includes steps to make your body work better. It may involve a team of specialists. Doing exercises. Making changes to your diet. Using oxygen. Lung surgery. Lung transplant. Comfort measures (palliative care). Follow these instructions at home: Medicines Take over-the-counter and prescription medicines only as told by your doctor. Talk to your doctor before taking any cough or allergy medicines. You may need to avoid medicines that cause your lungs to be dry. Lifestyle If you smoke, stop smoking. Smoking makes the problem worse. Do not smoke or use any products that contain nicotine or tobacco. If you need help quitting, ask your doctor. Avoid being around things that make your breathing worse. This may include smoke, chemicals, and fumes. Stay active, but remember to rest as well. Learn and use tips on how to manage stress and control your breathing. Make sure you get enough sleep. Most adults need at least 7 hours of sleep every night. Eat healthy foods. Eat smaller meals more often. Rest before meals. Controlled breathing Learn and use tips on how to control your breathing as told by your doctor. Try: Breathing in (inhaling) through your nose for 1 second. Then, pucker your lips and breath out (exhale) through your lips for 2 seconds. Putting one hand on your belly (abdomen). Breathe in slowly through your nose for 1 second. Your hand on your belly should move out. Pucker your lips and breathe out slowly through your lips. Your hand on your belly should move in as you breathe out.  Controlled coughing Learn and use  controlled coughing to clear mucus from your lungs. Follow these steps: Lean your head a little forward. Breathe in deeply. Try to hold your breath for 3 seconds. Keep your mouth slightly open while coughing 2 times. Spit any mucus out into a  tissue. Rest and do the steps again 1 or 2 times as needed. General instructions Make sure you get all the shots (vaccines) that your doctor recommends. Ask your doctor about a flu shot and a pneumonia shot. Use oxygen therapy and pulmonary rehabilitation if told by your doctor. If you need home oxygen therapy, ask your doctor if you should buy a tool to measure your oxygen level (oximeter). Make a COPD action plan with your doctor. This helps you to know what to do if you feel worse than usual. Manage any other conditions you have as told by your doctor. Avoid going outside when it is very hot, cold, or humid. Avoid people who have a sickness you can catch (contagious). Keep all follow-up visits. Contact a doctor if: You cough up more mucus than usual. There is a change in the color or thickness of the mucus. It is harder to breathe than usual. Your breathing is faster than usual. You have trouble sleeping. You need to use your medicines more often than usual. You have trouble doing your normal activities such as getting dressed or walking around the house. Get help right away if: You have shortness of breath while resting. You have shortness of breath that stops you from: Being able to talk. Doing normal activities. Your chest hurts for longer than 5 minutes. Your skin color is more blue than usual. Your pulse oximeter shows that you have low oxygen for longer than 5 minutes. You have a fever. You feel too tired to breathe normally. These symptoms may represent a serious problem that is an emergency. Do not wait to see if the symptoms will go away. Get medical help right away. Call your local emergency services (911 in the [place] Do not drive yourself to the hospital. Summary Chronic obstructive pulmonary disease (COPD) is a long-term lung problem. The way your lungs work will never return to normal. Usually the condition gets worse over time. There are things you can do to keep  yourself as healthy as possible. Take over-the-counter and prescription medicines only as told by your doctor. If you smoke, stop. Smoking makes the problem worse. This information is not intended to replace advice given to you by your health care provider. Make sure you discuss any questions you have with your health care provider. Document Revised: 03/03/2020 Document Reviewed: 03/04/2020 Elsevier Patient Education  2024 [employer]     Chronic Respiratory Failure Chronic respiratory failure is a long-term type of respiratory failure. Respiratory failure is when one or both of these things happen: Oxygen cannot pass from the lungs into the blood, causing the blood oxygen level to drop. Loss of blood oxygen means tissues and organs may not work well. A gas called carbon dioxide cannot pass from the blood into the lungs so the body can get rid of it. The buildup of carbon dioxide can damage the tissues and organs in the body. Chronic respiratory failure may develop over weeks, months, or years. It is usually the body's response to another long-term (chronic) condition that affects breathing. You may also get sudden shortness of breath (acute respiratory failure) when you have chronic respiratory failure. Sudden shortness of breath needs to be treated right away. What increases the risk?  You are more likely to develop this condition if you have another chronic condition, such as: Chronic obstructive pulmonary disease (COPD). Cystic fibrosis. Neuromuscular weakness from a stroke, spinal cord injury, or myasthenia gravis. Pulmonary fibrosis. This is scarring of the lung tissue. Sleep apnea. Obesity-hypoventilation syndrome. Congestive heart failure. What are the signs or symptoms? Chronic respiratory failure may not have clear symptoms when it happens slowly over time and the body gets used to it. Symptoms of this condition may include: Shortness of breath or trouble breathing. Feeling  very tired, sleepy, or low energy. Blurred vision or headaches. Fast breathing. Confusion. Fingernail beds or toenail beds that look bluish. Fingernails or toenails that look wide, swollen, spongy, or bulging. How is this diagnosed? This condition may be diagnosed based on: Your medical history. A physical exam. Other tests. These may include: Blood tests, such as an arterial blood gas test to check oxygen and carbon dioxide levels. Imaging tests, such as a chest X-ray or CT scan. Pulmonary function tests. These help to find out if you have chronic lung disease. An echocardiogram. This test uses sound waves to make pictures of your heart. How is this treated? Treatment for this condition depends on the cause. Treatment may include: Oxygen given through a tube with prongs that sit in your nose (nasal cannula) or through a mask that fits over your face. Noninvasive positive pressure ventilation. This is breathing support using a machine to blow air into your lungs through a mask. Examples of these machines are: Continuous positive airway pressure (CPAP) machine. Bilevel positive airway pressure (BIPAP) machine. Medicines to help with breathing, such as: Medicines that open up and relax air passages, such as bronchodilators. These may be given through a device that turns liquid medicines into a mist you can breathe in (nebulizer). Diuretics. These medicines get rid of extra fluid in your lungs. Steroid medicines. These decrease inflammation in the lungs. Pulmonary rehabilitation. This is an exercise program that strengthens the muscles in your chest and helps you learn breathing methods to manage your condition. A ventilator. This is a breathing machine that sends oxygen to your lungs through a tube that is put into your windpipe (trachea). This machine is used when you can no longer breathe well enough on your own. Follow these instructions at home: Medicines Take over-the-counter and  prescription medicines only as told by your health care provider. If you were prescribed antibiotics, take them as told by your health care provider. Do not stop using the antibiotic even if you start to feel better. General instructions  Do not use any products that contain nicotine or tobacco. These products include cigarettes, chewing tobacco, and vaping devices, such as e-cigarettes. If you need help quitting, ask your health care provider. If you were told to use oxygen therapy at home, follow the instructions from your health care provider. Do not use more oxygen than you were told. Getting too much oxygen may be harmful when you have this condition. If you were given a CPAP or BIPAP machine, follow the instructions for use, cleaning, and care as told by your health care provider. Return to your normal activities as told by your health care provider. Ask your health care provider what activities are safe for you. Stay up to date on all vaccines, especially pneumonia and yearly flu (influenza) vaccines. Stay away from people who are sick, and avoid crowded places, especially during the cold and flu season. Keep all follow-up visits. Contact a health care provider if:  Your shortness of breath gets worse. You have more mucus from your lungs (sputum), high-pitched whistling sounds when you breathe (wheezing), coughing, or loss of energy. You get oxygen therapy and you still have trouble breathing. You have a fever of 100.68F (38C) or higher. Get help right away if: Your shortness of breath gets much worse or gets worse very fast. You get pain, tightness, or pressure in your chest. You cannot say more than a few words without having to catch your breath. Your lips, toenails, or fingernails look bluish. You become confused or difficult to wake up. These symptoms may be an emergency. Get help right away. Call 911. Do not wait to see if the symptoms will go away. Do not drive yourself to the  hospital. Summary Chronic respiratory failure is a long-term breathing problem that may develop over weeks, months, or years. It is usually the body's response to another long-term condition that affects breathing. This condition may not have clear symptoms when it happens slowly over time and the body gets used to it. Shortness of breath and trouble breathing are the most common symptoms of this condition. If you were told to use oxygen therapy at home, do not use more oxygen than you were told. Getting too much oxygen may be harmful when you have this condition. This information is not intended to replace advice given to you by your health care provider. Make sure you discuss any questions you have with your health care provider. Document Revised: 07/03/2021 Document Reviewed: 07/03/2021 Elsevier Patient Education  2024 [employer].

## 2023-01-31 NOTE — Progress Notes (Signed)
@Patient  ID: Kelly Henderson, female    DOB: 10/04/1945, 77 y.o.   MRN: [id number]  Chief Complaint  Patient presents with   Follow-up    Doing well.  Persistent SOB with exertion during hot weather    Referring provider: Sharlene Dory*  HPI: 77 year old female, former smoker. Past medical history significant for COPD group B. Patient of Dr. Thora Lance, last seen 08/25/22.   01/31/2023- Interim hx  Patient presents today for 5 month follow-up.  Patient of Dr. Thora Lance, last seen in April for COPD chronic respiratory failure. Declined oxygen, she was referred and completed pulmonary rehab. She used oxygen while at pulmonary rehab but continues to decline home oxygen. She enjoyed [facility] She stopped working this year. She has a cough, mucus gets stuck back of her throat. Feels cough/congestion could be due to the glu she uses to permanently fix her dentures, at times the bottom one will fall out. She plans to have dental work in October. She has a lot of drainage and rhinitis symptoms. She is compliant with Breztri Aerosphere two puffs twice a day. She has not required Albuterol rescue inhaler. She takes Singulair 10mg  at bedtime. Not able to use nasal sprays.   CT in April 2024 showed scattered irregular consolidations decreased in size consistent with resolving infectious inflammatory process, LUNGS RADS 3. Needs repeat CT imaging in 6 months, due October 2024.    Chest Imaging: CT 06/04/22 reviewed by me with waxing and waning pulmonary nodules, RML, lower lobe predominant bronchiectasis   CT Chest 08/18/22 with decrease in size of scattered nodular consolidation  Allergies  Allergen Reactions   Latex Hives   Morphine And Codeine Swelling    Immunization History  Administered Date(s) Administered   Moderna Sars-Covid-2 Vaccination 06/13/2019, 07/11/2019   Pneumococcal Conjugate-13 08/19/2016   Pneumococcal Polysaccharide-23 01/08/2019    Td 11/07/2008   Tdap 12/28/2021   Zoster Recombinant(Shingrix) 08/19/2016, 03/03/2017    Past Medical History:  Diagnosis Date   Allergy morphine 1986   Aortic atherosclerosis (HCC)    CT 2019   Emphysema of lung (HCC)    History of chicken pox    History of shingles    Osteoporosis     Tobacco History: Social History   Tobacco Use  Smoking Status Former   Current packs/day: 0.00   Average packs/day: 2.0 packs/day for 50.0 years (100.0 ttl pk-yrs)   Types: Cigarettes   Start date: 07/01/1966   Quit date: 07/01/2016   Years since quitting: 6.6  Smokeless Tobacco Never   Counseling given: Not Answered   Outpatient Medications Prior to Visit  Medication Sig Dispense Refill   albuterol (VENTOLIN HFA) 108 (90 Base) MCG/ACT inhaler TAKE 2 PUFFS BY MOUTH EVERY 6 HOURS AS NEEDED FOR WHEEZE OR SHORTNESS OF BREATH 8.5 each 2   benzonatate (TESSALON) 100 MG capsule Take 1 capsule (100 mg total) by mouth 3 (three) times daily as needed. 20 capsule 0   Budeson-Glycopyrrol-Formoterol (BREZTRI AEROSPHERE) 160-9-4.8 MCG/ACT AERO Inhale 2 puffs into the lungs 2 (two) times daily. Rinse mouth out after use. 10.7 g 11   montelukast (SINGULAIR) 10 MG tablet TAKE 1 TABLET BY MOUTH EVERYDAY AT BEDTIME 30 tablet 3   promethazine (PHENERGAN) 25 MG suppository PLACE 1 SUPPOSITORY (25 MG TOTAL) RECTALLY EVERY 8 (EIGHT) HOURS AS NEEDED FOR NAUSEA OR VOMITING. 12 suppository 0   rosuvastatin (CRESTOR) 20 MG tablet TAKE 1 TABLET BY MOUTH EVERY DAY 90  tablet 3   Vitamin D, Ergocalciferol, (DRISDOL) 1.25 MG (50000 UNIT) CAPS capsule TAKE 1 CAPSULE (50,000 UNITS TOTAL) BY MOUTH EVERY 7 (SEVEN) DAYS 12 capsule 0   fluticasone (FLONASE) 50 MCG/ACT nasal spray Place 2 sprays into both nostrils daily. 16 g 6   No facility-administered medications prior to visit.   Review of Systems  Review of Systems  Constitutional: Negative.   HENT:  Positive for congestion.   Respiratory:  Positive for cough.  Negative for chest tightness and wheezing.   Cardiovascular: Negative.  Negative for leg swelling.     Physical Exam  BP 116/74 (BP Location: Left Arm, Patient Position: Sitting, Cuff Size: Large)   Pulse 75   Temp 98 F (36.7 C) (Oral)   Ht 5\' 7"  (1.702 m)   Wt 217 lb 12.8 oz (98.8 kg)   SpO2 93%   BMI 34.11 kg/m  Physical Exam Constitutional:      General: She is not in acute distress.    Appearance: Normal appearance. She is not ill-appearing.  HENT:     Mouth/Throat:     Mouth: Mucous membranes are moist.     Pharynx: Oropharynx is clear.  Cardiovascular:     Rate and Rhythm: Normal rate and regular rhythm.  Pulmonary:     Effort: Pulmonary effort is normal. No respiratory distress.     Breath sounds: Normal breath sounds. No stridor. No wheezing or rhonchi.     Comments: CTA Musculoskeletal:        General: Normal range of motion.  Skin:    General: Skin is warm and dry.  Neurological:     General: No focal deficit present.     Mental Status: She is alert and oriented to person, place, and time. Mental status is at baseline.  Psychiatric:        Mood and Affect: Mood normal.        Behavior: Behavior normal.        Thought Content: Thought content normal.        Judgment: Judgment normal.      Lab Results:  CBC    Component Value Date/Time   WBC 5.4 08/19/2016 1043   RBC 4.99 08/19/2016 1043   HGB 14.6 08/19/2016 1043   HCT 44.7 08/19/2016 1043   PLT 233.0 08/19/2016 1043   MCV 89.6 08/19/2016 1043   MCHC 32.6 08/19/2016 1043   RDW 14.3 08/19/2016 1043   LYMPHSABS 1.5 12/25/2008 0000   MONOABS 0.3 12/25/2008 0000   EOSABS 0.0 12/25/2008 0000   BASOSABS 0.0 12/25/2008 0000    BMET    Component Value Date/Time   NA 141 08/30/2022 1125   K 4.9 08/30/2022 1125   CL 103 08/30/2022 1125   CO2 30 08/30/2022 1125   GLUCOSE 90 08/30/2022 1125   BUN 18 08/30/2022 1125   CREATININE 0.82 08/30/2022 1125   CALCIUM 9.3 08/30/2022 1125   GFRNONAA 90.18  12/25/2008 0000    BNP No results found for: "BNP"  ProBNP No results found for: "PROBNP"  Imaging: No results found.   Assessment & Plan:   Pulmonary emphysema (HCC) - Continue Breztri Aerosphere 2 puffs morning and evening, as needed albuterol 2 puffs every 4-6 hours needed for breakthrough shortness of breath or wheezing, Singulair 10 mg at bedtime.  Advised patient take Mucinex 600 mg twice daily to loosen congestion  Chronic respiratory failure with hypoxia (HCC) - Not new; Oxygen level desaturated to 81% on room air, needing 2 L  continuous or 5 L pulsed to maintain O2 greater than 90%. She has attended pulmonary rehab, and plans [facility]  We had a long discussion and stressed the importance of oxygen use but patient continues to decline oxygen at this time. She understands short/long term risk by not using oxygen. Advised patient bring albuterol rescue inhaler with her at all times to use if needed.   Former smoker - Following with lung cancer screening program. CT in April 2024 showed scattered irregular consolidations decreased in size consistent with resolving infectious inflammatory process, LUNGS RADS 3. Needs repeat CT imaging in 6 months, due October 2024.   Patient instructions:  Oxygen dropped to 81% RA, needs 2L continuous or 5L POC to maintain O2 level <90%   We recommend getting you oxygen for use at home as needed, please let us know if you change your mind and want an order placed   Recommendations: - Continue Breztri Aerosphere- two puffs morning and evening - Use Albuterol 2 puffs every 4-6 hours as needed for breakthrough shortness of breath/wheezing  - Continue Singulair 10mg  at bedtime - Take mucinex 600mg  tablet twice daily to loosen congestion (or robitussin) - Due for follow-up CT imaging of your chest in October   Follow-up: - 3 months with Dr. Francine Graven Hunsucker or Celine Mans (30 mins- former Dr. Thora Lance- COPD/Chronic  respiratory failure)    Glenford Bayley, NP 02/06/2023

## 2023-02-06 ENCOUNTER — Other Ambulatory Visit: Payer: Self-pay | Admitting: Family Medicine

## 2023-02-06 DIAGNOSIS — J302 Other seasonal allergic rhinitis: Secondary | ICD-10-CM

## 2023-02-06 DIAGNOSIS — J9611 Chronic respiratory failure with hypoxia: Secondary | ICD-10-CM | POA: Insufficient documentation

## 2023-02-06 NOTE — Assessment & Plan Note (Signed)
-   Following with lung cancer screening program. CT in April 2024 showed scattered irregular consolidations decreased in size consistent with resolving infectious inflammatory process, LUNGS RADS 3. Needs repeat CT imaging in 6 months, due October 2024.

## 2023-02-06 NOTE — Assessment & Plan Note (Signed)
-   Continue Breztri Aerosphere 2 puffs morning and evening, as needed albuterol 2 puffs every 4-6 hours needed for breakthrough shortness of breath or wheezing, Singulair 10 mg at bedtime.  Advised patient take Mucinex 600 mg twice daily to loosen congestion

## 2023-02-06 NOTE — Assessment & Plan Note (Addendum)
-   Not new; Oxygen level desaturated to 81% on room air, needing 2 L continuous or 5 L pulsed to maintain O2 greater than 90%. She has attended pulmonary rehab, and plans [facility] to [facility]  We had a long discussion and stressed the importance of oxygen use but patient continues to decline oxygen at this time. She understands short/long term risk by not using oxygen. Advised patient bring albuterol rescue inhaler with her at all times to use if needed.

## 2023-02-07 ENCOUNTER — Other Ambulatory Visit (HOSPITAL_BASED_OUTPATIENT_CLINIC_OR_DEPARTMENT_OTHER): Payer: Self-pay | Admitting: Family Medicine

## 2023-02-07 DIAGNOSIS — Z1231 Encounter for screening mammogram for malignant neoplasm of breast: Secondary | ICD-10-CM

## 2023-02-07 NOTE — Progress Notes (Signed)
Reviewed and agree with assessment/plan.   Coralyn Helling, MD [facility] 02/07/2023, 8:19 AM Pager:  [phone]

## 2023-02-14 ENCOUNTER — Other Ambulatory Visit: Payer: Self-pay | Admitting: Family Medicine

## 2023-02-14 DIAGNOSIS — J439 Emphysema, unspecified: Secondary | ICD-10-CM

## 2023-03-01 ENCOUNTER — Ambulatory Visit: Payer: No Typology Code available for payment source | Admitting: Family Medicine

## 2023-03-01 ENCOUNTER — Encounter: Payer: Self-pay | Admitting: Family Medicine

## 2023-03-01 VITALS — BP 121/72 | HR 72 | Temp 98.3°F | Ht 67.0 in | Wt 223.2 lb

## 2023-03-01 DIAGNOSIS — Z23 Encounter for immunization: Secondary | ICD-10-CM

## 2023-03-01 DIAGNOSIS — I700 Atherosclerosis of aorta: Secondary | ICD-10-CM

## 2023-03-01 DIAGNOSIS — J439 Emphysema, unspecified: Secondary | ICD-10-CM

## 2023-03-01 LAB — COMPREHENSIVE METABOLIC PANEL
ALT: 14 U/L (ref 0–35)
AST: 15 U/L (ref 0–37)
Albumin: 3.9 g/dL (ref 3.5–5.2)
Alkaline Phosphatase: 88 U/L (ref 39–117)
BUN: 14 mg/dL (ref 6–23)
CO2: 30 meq/L (ref 19–32)
Calcium: 9.2 mg/dL (ref 8.4–10.5)
Chloride: 105 meq/L (ref 96–112)
Creatinine, Ser: 0.71 mg/dL (ref 0.40–1.20)
GFR: 82.85 mL/min (ref >60.00)
Glucose, Bld: 93 mg/dL (ref 70–99)
Potassium: 4.6 meq/L (ref 3.5–5.1)
Sodium: 143 meq/L (ref 135–145)
Total Bilirubin: 0.5 mg/dL (ref 0.2–1.2)
Total Protein: 6.8 g/dL (ref 6.0–8.3)

## 2023-03-01 LAB — LIPID PANEL
Cholesterol: 138 mg/dL (ref 0–200)
HDL: 72.1 mg/dL (ref >39.00)
LDL Cholesterol: 52 mg/dL (ref 0–99)
NonHDL: 65.4
Total CHOL/HDL Ratio: 2
Triglycerides: 69 mg/dL (ref 0.0–149.0)
VLDL: 13.8 mg/dL (ref 0.0–40.0)

## 2023-03-01 NOTE — Patient Instructions (Addendum)
Give us 2-3 business days to get the results of your labs back.   Keep the diet clean and stay active.  Let us know if you need anything. 

## 2023-03-01 NOTE — Progress Notes (Signed)
Chief Complaint  Patient presents with   Follow-up    6 month     Kelly Henderson is a 77 y.o. female here for COPD.  Currently treated with Breztri. Compliance is excellent. Uses rescue inhaler 2 times per week. Reports breathing is good overall. No nighttime awakenings.  Hyperlipidemia Patient presents for dyslipidemia follow up. Currently being treated with Crestor 20 mg/d and compliance with treatment thus far has been good. She denies myalgias. She is usually adhering to a healthy diet. Exercise: active in house The patient is known to have coexisting coronary artery disease.  Past Medical History:  Diagnosis Date   Allergy morphine 1986   Aortic atherosclerosis (HCC)    CT 2019   Emphysema of lung (HCC)    History of chicken pox    History of shingles    Osteoporosis     BP 121/72 (BP Location: Right Arm, Patient Position: Sitting, Cuff Size: Normal)   Pulse 72   Temp 98.3 F (36.8 C) (Oral)   Ht 5\' 7"  (1.702 m)   Wt 223 lb 4 oz (101.3 kg)   SpO2 93%   BMI 34.97 kg/m  Gen: Awake, alert HEENT: MMM, nares patent, no polyps Heart: RRR, no LE edema Lungs: CTAB, no accessory muscle use Msk: No clubbing or cyanosis Psych: Age appropriate judgment and insight  Aortic atherosclerosis (HCC)  Pulmonary emphysema, unspecified emphysema type (HCC)  Need for influenza vaccination - Plan: Flu Vaccine Trivalent High Dose (Fluad)  Chronic, stable. Cont Crestor 20 mg/d. Counseled on diet/exercise. Chronic, stable. Cont Breztri (will give Trelegy samples today as she is in the donut hole). SABA prn.  Flu shot today.  F/u in 6 mo. The patient voiced understanding and agreement to the plan.  Jilda Roche [place], DO 03/01/23 11:11 AM

## 2023-03-02 ENCOUNTER — Ambulatory Visit (HOSPITAL_COMMUNITY): Payer: No Typology Code available for payment source

## 2023-03-02 ENCOUNTER — Ambulatory Visit (HOSPITAL_BASED_OUTPATIENT_CLINIC_OR_DEPARTMENT_OTHER): Payer: No Typology Code available for payment source

## 2023-03-07 ENCOUNTER — Ambulatory Visit (HOSPITAL_BASED_OUTPATIENT_CLINIC_OR_DEPARTMENT_OTHER)
Admission: RE | Admit: 2023-03-07 | Discharge: 2023-03-07 | Disposition: A | Discharge status: Home / Self Care | Payer: No Typology Code available for payment source | Source: Ambulatory Visit | Attending: Family Medicine | Admitting: Family Medicine

## 2023-03-07 ENCOUNTER — Ambulatory Visit (HOSPITAL_BASED_OUTPATIENT_CLINIC_OR_DEPARTMENT_OTHER)
Admission: RE | Admit: 2023-03-07 | Discharge: 2023-03-07 | Disposition: A | Discharge status: Home / Self Care | Payer: No Typology Code available for payment source | Source: Ambulatory Visit | Attending: Primary Care | Admitting: Primary Care

## 2023-03-07 ENCOUNTER — Other Ambulatory Visit (HOSPITAL_BASED_OUTPATIENT_CLINIC_OR_DEPARTMENT_OTHER): Payer: Self-pay | Admitting: Family Medicine

## 2023-03-07 ENCOUNTER — Encounter (HOSPITAL_BASED_OUTPATIENT_CLINIC_OR_DEPARTMENT_OTHER): Payer: Self-pay

## 2023-03-07 DIAGNOSIS — Z1231 Encounter for screening mammogram for malignant neoplasm of breast: Secondary | ICD-10-CM | POA: Insufficient documentation

## 2023-03-07 DIAGNOSIS — J432 Centrilobular emphysema: Secondary | ICD-10-CM | POA: Diagnosis not present

## 2023-03-07 DIAGNOSIS — R918 Other nonspecific abnormal finding of lung field: Secondary | ICD-10-CM | POA: Insufficient documentation

## 2023-03-10 ENCOUNTER — Other Ambulatory Visit: Payer: Self-pay | Admitting: Family Medicine

## 2023-03-10 DIAGNOSIS — I700 Atherosclerosis of aorta: Secondary | ICD-10-CM

## 2023-03-16 ENCOUNTER — Encounter: Payer: Self-pay | Admitting: Family Medicine

## 2023-04-20 ENCOUNTER — Encounter: Payer: Self-pay | Admitting: Family Medicine

## 2023-04-20 ENCOUNTER — Other Ambulatory Visit: Payer: Self-pay

## 2023-04-20 ENCOUNTER — Other Ambulatory Visit: Payer: Self-pay | Admitting: Family Medicine

## 2023-04-20 DIAGNOSIS — J439 Emphysema, unspecified: Secondary | ICD-10-CM

## 2023-04-20 MED ORDER — TRELEGY ELLIPTA 100-62.5-25 MCG/ACT IN AEPB: 1.0000 | INHALATION_SPRAY | Freq: Every day | RESPIRATORY_TRACT | 11 refills | Status: DC

## 2023-05-02 ENCOUNTER — Ambulatory Visit: Payer: No Typology Code available for payment source | Admitting: Pulmonary Disease

## 2023-05-02 ENCOUNTER — Encounter: Payer: Self-pay | Admitting: Pulmonary Disease

## 2023-05-02 VITALS — BP 132/83 | HR 78 | Temp 97.5°F | Wt 224.0 lb

## 2023-05-02 DIAGNOSIS — J302 Other seasonal allergic rhinitis: Secondary | ICD-10-CM

## 2023-05-02 DIAGNOSIS — R0600 Dyspnea, unspecified: Secondary | ICD-10-CM

## 2023-05-02 DIAGNOSIS — J439 Emphysema, unspecified: Secondary | ICD-10-CM | POA: Diagnosis not present

## 2023-05-02 MED ORDER — FLUTICASONE PROPIONATE 50 MCG/ACT NA SUSP
1.0000 | Freq: Every day | NASAL | 2 refills | Status: DC
Start: 2023-05-02 — End: 2023-11-16

## 2023-05-02 NOTE — Patient Instructions (Addendum)
Continue trelegy inhaler 1 puff daily - rinse mouth out after each use  Continue as needed albuterol 1-2 puffs every 4-6 hours as needed  Continue singulair and zyrtec daily  Start fluticasone nasal spray, 1 spray per nostril daily  We will schedule you for an overnight oxygen test and an echocardiogram to evaluate your heart  Follow up in 4 months, call sooner if needed

## 2023-05-02 NOTE — Progress Notes (Signed)
Synopsis: Hx of COPD  Subjective:   PATIENT ID: Kelly Henderson GENDER: female DOB: [birth year], MRN: [id number]   HPI  Chief Complaint  Patient presents with   Follow-up   Kelly Henderson is a 77 year old woman, former smoker with history of emphysema/COPD and pulmonary nodules who returns to pulmonary clinic.   She was last seen by Buelah Manis, NP 01/31/23 and planned for follow up CT chest in 10/24.   She is on breztri 2 puffs twice daily for COPD and as needed albuterol.  They were recommended to use oxygen during the day and night, but have been reluctant to do so, preferring to rest and regain their breath rather than carry an oxygen device. They report that their oxygen levels are safe at rest, but drop when they engage in physical activity. They have not had an overnight oxygen test to monitor their levels during sleep.  She is currently on Trelegy for their COPD and emphysema, and they report that it helps, though not to the extent advertised. They were previously on Breztri and did not notice a significant difference between the two medications. They also take Singulair daily for allergies, which have become more pronounced since they quit smoking. They report frequent sneezing and post-nasal drip, which has caused hoarseness in their voice. They also take Zyrtec and two Benadryl at night, as well as ibuprofen or Tylenol for knee pain.  Over the past year, the patient has gained significant weight, which they attribute to stress eating following an abnormal CT scan. They report that this weight gain has exacerbated their knee pain and made it more difficult to manage their breathlessness. They are considering weight loss medications and chelation therapy for atherosclerosis, which was noted on their CT scan.  The patient has not had an ultrasound of their heart, but they report that their CT scan mentioned pulmonary hypertension. They have not had any recent issues with cough or  wheezing, but they do report a persistent hoarseness in their voice due to post-nasal drip. They have not been using any nasal sprays.  Past Medical History:  Diagnosis Date   Allergy morphine 1986   Aortic atherosclerosis (HCC)    CT 2019   Emphysema of lung (HCC)    History of chicken pox    History of shingles    Osteoporosis      Family History  Problem Relation Age of Onset   Cancer Mother        Uterus   Alcohol abuse Father    Heart disease Father    Heart attack Son    Early death Son    Heart disease Son    Colon polyps Neg Hx    Colon cancer Neg Hx    Esophageal cancer Neg Hx    Rectal cancer Neg Hx    Stomach cancer Neg Hx      Social History   Socioeconomic History   Marital status: Married    Spouse name: Not on file   Number of children: Not on file   Years of education: Not on file   Highest education level: Bachelor's degree (e.g., BA, AB, BS)  Occupational History   Not on file  Tobacco Use   Smoking status: Former    Current packs/day: 0.00    Average packs/day: 2.0 packs/day for 50.0 years (100.0 ttl pk-yrs)    Types: Cigarettes    Start date: 07/01/1966    Quit date: 07/01/2016    Years  since quitting: 6.8   Smokeless tobacco: Never  Vaping Use   Vaping status: Never Used  Substance and Sexual Activity   Alcohol use: No   Drug use: No   Sexual activity: Yes    Birth control/protection: Surgical  Other Topics Concern   Not on file  Social History Narrative   Not on file   Social Drivers of Health   Financial Resource Strain: Low Risk  (02/22/2023)   Overall Financial Resource Strain (CARDIA)    Difficulty of Paying Living Expenses: Not hard at all  Food Insecurity: No Food Insecurity (02/22/2023)   Hunger Vital Sign    Worried About Running Out of Food in the Last Year: Never true    Ran Out of Food in the Last Year: Never true  Transportation Needs: No Transportation Needs (02/22/2023)   PRAPARE - Scientist, research (physical sciences) (Medical): No    Lack of Transportation (Non-Medical): No  Physical Activity: Inactive (02/22/2023)   Exercise Vital Sign    Days of Exercise per Week: 0 days    Minutes of Exercise per Session: 40 min  Stress: No Stress Concern Present (02/22/2023)   [employer] of Occupational Health - Occupational Stress Questionnaire    Feeling of Stress : Only a little  Social Connections: Socially Integrated (02/22/2023)   Social Connection and Isolation Panel [NHANES]    Frequency of Communication with Friends and Family: More than three times a week    Frequency of Social Gatherings with Friends and Family: Three times a week    Attends Religious Services: 1 to 4 times per year    Active Member of Clubs or Organizations: Yes    Attends Banker Meetings: 1 to 4 times per year    Marital Status: Married  Catering manager Violence: Not At Risk (12/17/2022)   Humiliation, Afraid, Rape, and Kick questionnaire    Fear of Current or Ex-Partner: No    Emotionally Abused: No    Physically Abused: No    Sexually Abused: No     Allergies  Allergen Reactions   Latex Hives   Morphine And Codeine Swelling     Outpatient Medications Prior to Visit  Medication Sig Dispense Refill   albuterol (VENTOLIN HFA) 108 (90 Base) MCG/ACT inhaler TAKE 2 PUFFS BY MOUTH EVERY 6 HOURS AS NEEDED FOR WHEEZE OR SHORTNESS OF BREATH 8.5 each 2   Apoaequorin (PREVAGEN) 10 MG CAPS Take 1 tablet by mouth daily.     cetirizine (ZYRTEC) 10 MG chewable tablet Chew 10 mg by mouth daily.     diphenhydrAMINE (BENADRYL) 25 MG tablet Take 50 mg by mouth at bedtime.     Fluticasone-Umeclidin-Vilant (TRELEGY ELLIPTA) 100-62.5-25 MCG/ACT AEPB Inhale 1 puff into the lungs daily. 1 each 11   montelukast (SINGULAIR) 10 MG tablet TAKE 1 TABLET BY MOUTH EVERYDAY AT BEDTIME 30 tablet 3   promethazine (PHENERGAN) 25 MG suppository PLACE 1 SUPPOSITORY (25 MG TOTAL) RECTALLY EVERY 8 (EIGHT) HOURS AS NEEDED FOR  NAUSEA OR VOMITING. 12 suppository 0   rosuvastatin (CRESTOR) 20 MG tablet TAKE 1 TABLET BY MOUTH EVERY DAY 90 tablet 3   Vitamin D, Ergocalciferol, (DRISDOL) 1.25 MG (50000 UNIT) CAPS capsule TAKE 1 CAPSULE (50,000 UNITS TOTAL) BY MOUTH EVERY 7 (SEVEN) DAYS 12 capsule 0   No facility-administered medications prior to visit.   Review of Systems  Constitutional:  Negative for chills, fever, malaise/fatigue and weight loss.  HENT:  Negative for congestion, sinus pain  and sore throat.   Eyes: Negative.   Respiratory:  Positive for shortness of breath. Negative for cough, hemoptysis, sputum production and wheezing.   Cardiovascular:  Negative for chest pain, palpitations, orthopnea, claudication and leg swelling.  Gastrointestinal:  Negative for abdominal pain, heartburn, nausea and vomiting.  Genitourinary: Negative.   Musculoskeletal:  Negative for joint pain and myalgias.  Skin:  Negative for rash.  Neurological:  Negative for weakness.  Endo/Heme/Allergies: Negative.   Psychiatric/Behavioral: Negative.      Objective:   Vitals:   05/02/23 1028  BP: 132/83  Pulse: 78  Temp: (!) 97.5 F (36.4 C)  TempSrc: Oral  SpO2: 93%  Weight: 224 lb (101.6 kg)   Physical Exam Constitutional:      General: She is not in acute distress.    Appearance: Normal appearance.  Eyes:     General: No scleral icterus.    Conjunctiva/sclera: Conjunctivae normal.  Cardiovascular:     Rate and Rhythm: Normal rate and regular rhythm.  Pulmonary:     Breath sounds: No wheezing, rhonchi or rales.  Musculoskeletal:     Right lower leg: No edema.     Left lower leg: No edema.  Skin:    General: Skin is warm and dry.  Neurological:     General: No focal deficit present.    CBC    Component Value Date/Time   WBC 5.4 08/19/2016 1043   RBC 4.99 08/19/2016 1043   HGB 14.6 08/19/2016 1043   HCT 44.7 08/19/2016 1043   PLT 233.0 08/19/2016 1043   MCV 89.6 08/19/2016 1043   MCHC 32.6 08/19/2016  1043   RDW 14.3 08/19/2016 1043   LYMPHSABS 1.5 12/25/2008 0000   MONOABS 0.3 12/25/2008 0000   EOSABS 0.0 12/25/2008 0000   BASOSABS 0.0 12/25/2008 0000      Latest Ref Rng & Units 03/01/2023   11:35 AM 08/30/2022   11:25 AM 01/06/2022   10:12 AM  BMP  Glucose 70 - 99 mg/dL 93  90  409   BUN 6 - 23 mg/dL 14  18  21    Creatinine 0.40 - 1.20 mg/dL 8.11  9.14  7.82   Sodium 135 - 145 mEq/L 143  141  143   Potassium 3.5 - 5.1 mEq/L 4.6  4.9  5.1   Chloride 96 - 112 mEq/L 105  103  104   CO2 19 - 32 mEq/L 30  30  32   Calcium 8.4 - 10.5 mg/dL 9.2  9.3  9.8    Chest imaging: CT Chest wo Contrast 03/07/23 1. No suspicious pulmonary nodules. Recommend return to annual lung cancer screening CT. 2. Aortic atherosclerosis (ICD10-I70.0). Coronary artery calcification. 3. Enlarged pulmonic trunk, indicative of pulmonary arterial hypertension.   PFT:    Latest Ref Rng & Units 07/29/2022    3:47 PM  PFT Results  FVC-Pre L 2.26   FVC-Predicted Pre % 70   FVC-Post L 2.48   FVC-Predicted Post % 77   Pre FEV1/FVC % % 53   Post FEV1/FCV % % 56   FEV1-Pre L 1.21   FEV1-Predicted Pre % 50   FEV1-Post L 1.39   DLCO uncorrected ml/min/mmHg 12.86   DLCO UNC% % 60   DLCO corrected ml/min/mmHg 12.86   DLCO COR %Predicted % 60   DLVA Predicted % 70   TLC L 5.89   TLC % Predicted % 107   RV % Predicted % 142     Labs:  Path:  Echo:  Heart Catheterization:     Assessment & Plan:   Pulmonary emphysema, unspecified emphysema type (HCC) - Plan: Pulse oximetry, overnight, ECHOCARDIOGRAM COMPLETE  Seasonal allergies - Plan: fluticasone (FLONASE) 50 MCG/ACT nasal spray  Dyspnea, unspecified type  Discussion: Kelly Henderson is a 77 year old woman, former smoker with history of emphysema/COPD and pulmonary nodules who returns to pulmonary clinic.   COPD Patient declines daytime oxygen therapy despite recommendation due to desaturation with exertion. Oxygen saturation at rest is  93%. Patient acknowledges shortness of breath with exertion. Currently on Trelegy for management. -Order overnight oxygen test to assess for nocturnal hypoxia. -Continue Trelegy as tolerated.  Allergic Rhinitis Reports post-nasal drip and hoarseness, currently on montelukast and Zyrtec. -Add Flonase daily to regimen.  Weight Gain Patient reports significant weight gain over the past year, contributing to knee pain and exacerbating shortness of breath. Expressed interest in weight loss medications. -Encourage continued efforts towards weight loss through diet and exercise.  Cardiac Evaluation No known history of cardiac ultrasound. Enlarged PA on CT chest scan. -Order echocardiogram to assess cardiac function and evaluate for pulmonary hypertension.  Follow-up in 4 months   Melody Comas, MD [place] [facility] Office: [phone]   Current Outpatient Medications:    albuterol (VENTOLIN HFA) 108 (90 Base) MCG/ACT inhaler, TAKE 2 PUFFS BY MOUTH EVERY 6 HOURS AS NEEDED FOR WHEEZE OR SHORTNESS OF BREATH, Disp: 8.5 each, Rfl: 2   Apoaequorin (PREVAGEN) 10 MG CAPS, Take 1 tablet by mouth daily., Disp: , Rfl:    cetirizine (ZYRTEC) 10 MG chewable tablet, Chew 10 mg by mouth daily., Disp: , Rfl:    diphenhydrAMINE (BENADRYL) 25 MG tablet, Take 50 mg by mouth at bedtime., Disp: , Rfl:    fluticasone (FLONASE) 50 MCG/ACT nasal spray, Place 1 spray into both nostrils daily., Disp: 16 g, Rfl: 2   Fluticasone-Umeclidin-Vilant (TRELEGY ELLIPTA) 100-62.5-25 MCG/ACT AEPB, Inhale 1 puff into the lungs daily., Disp: 1 each, Rfl: 11   montelukast (SINGULAIR) 10 MG tablet, TAKE 1 TABLET BY MOUTH EVERYDAY AT BEDTIME, Disp: 30 tablet, Rfl: 3   promethazine (PHENERGAN) 25 MG suppository, PLACE 1 SUPPOSITORY (25 MG TOTAL) RECTALLY EVERY 8 (EIGHT) HOURS AS NEEDED FOR NAUSEA OR VOMITING., Disp: 12 suppository, Rfl: 0   rosuvastatin (CRESTOR) 20 MG tablet, TAKE 1 TABLET BY MOUTH EVERY DAY,  Disp: 90 tablet, Rfl: 3   Vitamin D, Ergocalciferol, (DRISDOL) 1.25 MG (50000 UNIT) CAPS capsule, TAKE 1 CAPSULE (50,000 UNITS TOTAL) BY MOUTH EVERY 7 (SEVEN) DAYS, Disp: 12 capsule, Rfl: 0

## 2023-05-09 ENCOUNTER — Ambulatory Visit (HOSPITAL_COMMUNITY): Payer: No Typology Code available for payment source | Attending: Cardiology

## 2023-05-09 DIAGNOSIS — J439 Emphysema, unspecified: Secondary | ICD-10-CM

## 2023-05-09 DIAGNOSIS — R0609 Other forms of dyspnea: Secondary | ICD-10-CM | POA: Diagnosis not present

## 2023-05-09 DIAGNOSIS — R0600 Dyspnea, unspecified: Secondary | ICD-10-CM | POA: Diagnosis present

## 2023-05-09 LAB — ECHOCARDIOGRAM COMPLETE
Area-P 1/2: 3.21 cm2
S' Lateral: 2.8 cm

## 2023-05-12 ENCOUNTER — Other Ambulatory Visit: Payer: Self-pay | Admitting: Family Medicine

## 2023-05-12 DIAGNOSIS — R0600 Dyspnea, unspecified: Secondary | ICD-10-CM | POA: Diagnosis not present

## 2023-05-24 ENCOUNTER — Ambulatory Visit: Payer: PPO | Admitting: Family Medicine

## 2023-05-24 ENCOUNTER — Other Ambulatory Visit (HOSPITAL_BASED_OUTPATIENT_CLINIC_OR_DEPARTMENT_OTHER): Payer: Self-pay

## 2023-05-24 ENCOUNTER — Encounter: Payer: Self-pay | Admitting: Family Medicine

## 2023-05-24 VITALS — BP 130/80 | HR 94 | Temp 98.0°F | Resp 16 | Ht 67.0 in | Wt 225.0 lb

## 2023-05-24 DIAGNOSIS — J441 Chronic obstructive pulmonary disease with (acute) exacerbation: Secondary | ICD-10-CM | POA: Diagnosis not present

## 2023-05-24 DIAGNOSIS — J069 Acute upper respiratory infection, unspecified: Secondary | ICD-10-CM | POA: Diagnosis not present

## 2023-05-24 MED ORDER — DOXYCYCLINE HYCLATE 100 MG PO TABS
100.0000 mg | ORAL_TABLET | Freq: Two times a day (BID) | ORAL | 0 refills | Status: AC
Start: 2023-05-26 — End: 2023-06-02
  Filled 2023-05-24: qty 14, 7d supply, fill #0

## 2023-05-24 MED ORDER — PREDNISONE 20 MG PO TABS
40.0000 mg | ORAL_TABLET | Freq: Every day | ORAL | 0 refills | Status: AC
Start: 2023-05-24 — End: 2023-05-29
  Filled 2023-05-24: qty 10, 5d supply, fill #0

## 2023-05-24 NOTE — Progress Notes (Signed)
 Chief Complaint  Patient presents with   Cough    cough    Kelly Henderson here for URI complaints.  Duration: 1 week  Associated symptoms: sinus congestion, rhinorrhea, lower posterior chest pain on L, and productive cough Denies: sinus pain, itchy watery eyes, ear pain, ear drainage, sore throat, myalgia, and fevers, new SOB/wheezing , bedrest, trauma to legs, swelling in legs Treatment to date: Alk-Seltzer Cold/flu, Advil Sinus/Cold Sick contacts: Yes- spouse  Past Medical History:  Diagnosis Date   Allergy morphine 1986   Aortic atherosclerosis (HCC)    CT 2019   Emphysema of lung (HCC)    History of chicken pox    History of shingles    Osteoporosis     Objective BP 130/80   Pulse 94   Temp 98 F (36.7 C) (Oral)   Resp 16   Ht 5' 7 (1.702 m)   Wt 225 lb (102.1 kg)   SpO2 92%   BMI 35.24 kg/m  General: Awake, alert, appears stated age HEENT: AT, [place], ears patent b/l and TM's neg, nares patent w/o discharge, pharynx pink and without exudates, MMM Neck: No masses or asymmetry Heart: RRR Lungs: diffuse exp wheezing, no accessory muscle use Psych: Age appropriate judgment and insight, normal mood and affect  COPD exacerbation (HCC) - Plan: predniSONE  (DELTASONE ) 20 MG tablet, doxycycline  (VIBRA -TABS) 100 MG tablet  URI with cough and congestion  Exacerbation of chronic issue.  5-day prednisone  burst 40 mg daily.  No improvement that is significant in the next 1 to 2 days after starting the medication, she will take a 7-day course of doxycycline .  Continue to push fluids, practice good hand hygiene, cover mouth when coughing. F/u prn. If starting to experience fevers, shaking, or worsening shortness of breath, seek immediate care. Pt voiced understanding and agreement to the plan.  Mabel [place], DO 05/24/23 11:28 AM

## 2023-05-24 NOTE — Patient Instructions (Addendum)
 Continue to push fluids, practice good hand hygiene, and cover your mouth if you cough.  If you start having fevers, shaking or shortness of breath, seek immediate care.  OK to take Tylenol 1000 mg (2 extra strength tabs) or 975 mg (3 regular strength tabs) every 6 hours as needed.  Ice/cold pack over area for 10-15 min twice daily.  Heat (pad or rice pillow in microwave) over affected area, 10-15 minutes twice daily.   If not significantly better in 1-2 days after starting the prednisone , then taking the doxycycline .   Let us  know if you need anything.

## 2023-06-01 ENCOUNTER — Encounter: Payer: Self-pay | Admitting: Pulmonary Disease

## 2023-06-01 DIAGNOSIS — G4734 Idiopathic sleep related nonobstructive alveolar hypoventilation: Secondary | ICD-10-CM

## 2023-06-01 DIAGNOSIS — R0683 Snoring: Secondary | ICD-10-CM

## 2023-06-04 ENCOUNTER — Other Ambulatory Visit: Payer: Self-pay | Admitting: Family Medicine

## 2023-06-04 DIAGNOSIS — J302 Other seasonal allergic rhinitis: Secondary | ICD-10-CM

## 2023-06-07 NOTE — Telephone Encounter (Signed)
PCCs, please see patient message regarding ONO and insurance coverage.  Thank you.

## 2023-06-13 NOTE — Telephone Encounter (Signed)
Dr. Francine Graven, I have placed the ONO for this patient in your box in A pod for your review.  Thank you.

## 2023-06-13 NOTE — Telephone Encounter (Signed)
Called [facility] and spoke with Archie Patten, she will fax the ONO to [phone] to my attention.

## 2023-06-15 NOTE — Telephone Encounter (Signed)
 Results have been relayed to the patient/authorized caretaker. The patient/authorized caretaker verbalized understanding. Patient states that she will do the Split study if you order it. She is also asking for oxygen  to go ahead and be ordered as she gets short of breath more. She had an order in 01/2023 but she didn't have a good experience and never went back to get it but she is interested in it now but doesn't want to wait for sleep study to get oxygen . Please advise.

## 2023-06-15 NOTE — Telephone Encounter (Signed)
Split night study has been ordered.   Recommend getting this done and ordering the CPAP or Bipap along with oxygen if needed at that time.  Dr. Francine Graven

## 2023-07-04 ENCOUNTER — Encounter: Payer: Self-pay | Admitting: Pulmonary Disease

## 2023-08-02 ENCOUNTER — Telehealth: Payer: Self-pay | Admitting: Pulmonary Disease

## 2023-08-02 NOTE — Telephone Encounter (Signed)
 Patient states that she needs another oxygen order to be sent to [facility]. She had an order in the past but let it expire because she wasn't ready to commit to oxygen use.

## 2023-08-02 NOTE — Telephone Encounter (Signed)
 ATC X1. LMTCB. If pt wants to be put back on oxygen, she will need a office visit with pulmonologist. (Dr Francine Graven) Pt was last seen in December 2024. DME orders are only good if seen within 30 days.

## 2023-08-03 NOTE — Telephone Encounter (Signed)
 ATC X2. Lmtcb. I am going to send a Mychart message to pt, and complete note per protocol.

## 2023-08-25 ENCOUNTER — Other Ambulatory Visit: Payer: Self-pay | Admitting: Family Medicine

## 2023-08-29 ENCOUNTER — Ambulatory Visit (HOSPITAL_BASED_OUTPATIENT_CLINIC_OR_DEPARTMENT_OTHER): Payer: PPO | Attending: Pulmonary Disease | Admitting: Internal Medicine

## 2023-08-29 VITALS — Ht 67.0 in | Wt 225.0 lb

## 2023-08-29 DIAGNOSIS — G4733 Obstructive sleep apnea (adult) (pediatric): Secondary | ICD-10-CM | POA: Insufficient documentation

## 2023-08-29 DIAGNOSIS — R0683 Snoring: Secondary | ICD-10-CM

## 2023-08-29 DIAGNOSIS — G4734 Idiopathic sleep related nonobstructive alveolar hypoventilation: Secondary | ICD-10-CM | POA: Diagnosis not present

## 2023-09-13 NOTE — Procedures (Signed)
   [facility] [address] [place], [place] [address] Tel: [phone]   Fax: [phone]  Polysomnography Interpretation  Patient Name:  Kelly Henderson, Kelly Henderson Date:  08/29/2023 Referring Physician:  Wilfredo Hanly, Md  Indications for Polysomnography The patient is a 78 year-old Female who is 5\' 7"  and weighs 225.0 lbs. Her BMI equals 35.3.  A full night polysomnogram was performed to evaluate for Obstructive Sleep Apnea.  Medication  Benadryl  Prilosec  Advil  Zyrtec  Singulair   Crestor    Polysomnogram Data A full night polysomnogram recorded the standard physiologic parameters including EEG, EOG, EMG, EKG, nasal and oral airflow.  Respiratory parameters of chest and abdominal movements were recorded with Respiratory Inductance Plethysmography belts.  Oxygen saturation was recorded by pulse oximetry.   Sleep Architecture The total recording time of the polysomnogram was 364.9 minutes.  The total sleep time was 225.0 minutes.  The patient spent 4.2% of total sleep time in Stage N1, 64.7% in Stage N2, 23.3% in Stages N3, and 7.8% in REM.  Sleep latency was 35.7 minutes.  REM latency was 228.0 minutes.  Sleep Efficiency was 61.7%.  Wake after Sleep Onset time was 104.0 minutes.  Respiratory Events The polysomnogram revealed a presence of 13 obstructive, 6 central, and 1 mixed apneas resulting in an Apnea index of 5.3 events per hour.  There were 29 hypopneas (>=3% desaturation and/or arousal) resulting in an Apnea\Hypopnea Index (AHI >=3% desaturation and/or arousal) of 13.1 events per hour.  There were 2 hypopneas (>=4% desaturation) resulting in an Apnea\Hypopnea Index (AHI >=4% desaturation) of 5.9 events per hour.  There were 48 Respiratory Effort Related Arousals resulting in a RERA index of 12.8 events per hour. The Respiratory Disturbance Index is 25.9 events per hour.  The snore index was 4.5 events per hour.  Mean oxygen saturation  was 90.6%.  The lowest oxygen saturation during sleep was 83.0%.  Time spent <=88% oxygen saturation was 58.9 minutes (16.5%).  Limb Activity There were 9 total limb movements recorded, of this total, 4 were classified as PLMs.  PLM index was 1.1 per hour and PLM associated with Arousals index was 0.3 per hour.  Cardiac Summary The average pulse rate was 71.1 bpm.  The minimum pulse rate was 57.0 bpm while the maximum pulse rate was 92.0 bpm.  Cardiac rhythm was normal sinus rhythm with occasional PVCs..  Diagnosis:  Mild Obstructive Sleep Apnea Nocturnal Hypoxemia  Recommendations: Recommend in lab CPAP titration for sleep disordered breathing and nocturnal hypoxemia. The patient should be counseled in good sleep hygiene. The patient should be encouraged to avoid sleeping in the supine position. Encourage patient to avoid driving while sleepy.    This study was personally reviewed and electronically signed by: Jacqueline Matsu, Md Accredited Board Certified in Sleep Medicine Date/Time: 09/13/2023 7:40PM

## 2023-09-21 ENCOUNTER — Ambulatory Visit: Admitting: Pulmonary Disease

## 2023-09-22 ENCOUNTER — Ambulatory Visit: Admitting: Pulmonary Disease

## 2023-09-22 ENCOUNTER — Encounter: Payer: Self-pay | Admitting: Pulmonary Disease

## 2023-09-22 VITALS — BP 116/62 | HR 106 | Ht 67.0 in | Wt 225.0 lb

## 2023-09-22 DIAGNOSIS — J441 Chronic obstructive pulmonary disease with (acute) exacerbation: Secondary | ICD-10-CM

## 2023-09-22 MED ORDER — AZITHROMYCIN 250 MG PO TABS
ORAL_TABLET | ORAL | 0 refills | Status: DC
Start: 2023-09-22 — End: 2023-11-16

## 2023-09-22 MED ORDER — PREDNISONE 10 MG PO TABS
40.0000 mg | ORAL_TABLET | Freq: Every day | ORAL | 0 refills | Status: DC
Start: 2023-09-22 — End: 2023-11-16

## 2023-09-22 NOTE — Progress Notes (Addendum)
Synopsis: Hx of COPD  Subjective:   PATIENT ID: Kelly Henderson GENDER: female DOB: [birth year], MRN: [id number]  HPI  Chief Complaint  Patient presents with   Follow-up    Pt states possible head cold x Sunday , SOB, cough.   Kelly Henderson is a 78 year old woman, former smoker with history of emphysema/COPD and pulmonary nodules who returns to pulmonary clinic.   OV 05/01/24 She was last seen by Irby Mannan, NP 01/31/23 and planned for follow up CT chest in 10/24.   She is on breztri  2 puffs twice daily for COPD and as needed albuterol .  They were recommended to use oxygen during the day and night, but have been reluctant to do so, preferring to rest and regain their breath rather than carry an oxygen device. They report that their oxygen levels are safe at rest, but drop when they engage in physical activity. They have not had an overnight oxygen test to monitor their levels during sleep.  She is currently on Trelegy for their COPD and emphysema, and they report that it helps, though not to the extent advertised. They were previously on Breztri  and did not notice a significant difference between the two medications. They also take Singulair  daily for allergies, which have become more pronounced since they quit smoking. They report frequent sneezing and post-nasal drip, which has caused hoarseness in their voice. They also take Zyrtec and two Benadryl at night, as well as ibuprofen or Tylenol for knee pain.  Over the past year, the patient has gained significant weight, which they attribute to stress eating following an abnormal CT scan. They report that this weight gain has exacerbated their knee pain and made it more difficult to manage their breathlessness. They are considering weight loss medications and chelation therapy for atherosclerosis, which was noted on their CT scan.  The patient has not had an ultrasound of their heart, but they report that their CT scan mentioned  pulmonary hypertension. They have not had any recent issues with cough or wheezing, but they do report a persistent hoarseness in their voice due to post-nasal drip. They have not been using any nasal sprays.  OV 09/22/23 She experiences significant fatigue and shortness of breath, impacting her daily activities. She is unable to walk long distances without dyspnea and can only move slowly in a grocery store while leaning on a cart. She cannot walk briskly or run.  She recently developed a cold with symptoms of chills and a dry, non-productive cough that began last night. There is no fever. Prednisone  has been effective for similar symptoms in the past.  Her current medication includes Trelegy, which she finds effective for her respiratory symptoms. She is not using oxygen at night and has not been set up with a CPAP machine.   Past Medical History:  Diagnosis Date   Allergy morphine 1986   Aortic atherosclerosis (HCC)    CT 2019   Emphysema of lung (HCC)    History of chicken pox    History of shingles    Osteoporosis      Family History  Problem Relation Age of Onset   Cancer Mother        Uterus   Alcohol abuse Father    Heart disease Father    Heart attack Son    Early death Son    Heart disease Son    Colon polyps Neg Hx    Colon cancer Neg Hx    Esophageal cancer  Neg Hx    Rectal cancer Neg Hx    Stomach cancer Neg Hx      Social History   Socioeconomic History   Marital status: Married    Spouse name: Not on file   Number of children: Not on file   Years of education: Not on file   Highest education level: Bachelor's degree (e.g., BA, AB, BS)  Occupational History   Not on file  Tobacco Use   Smoking status: Former    Current packs/day: 0.00    Average packs/day: 2.0 packs/day for 50.0 years (100.0 ttl pk-yrs)    Types: Cigarettes    Start date: 07/01/1966    Quit date: 07/01/2016    Years since quitting: 7.2   Smokeless tobacco: Never  Vaping Use   Vaping  status: Never Used  Substance and Sexual Activity   Alcohol use: No   Drug use: No   Sexual activity: Yes    Birth control/protection: Surgical  Other Topics Concern   Not on file  Social History Narrative   Not on file   Social Drivers of Health   Financial Resource Strain: Low Risk  (02/22/2023)   Overall Financial Resource Strain (CARDIA)    Difficulty of Paying Living Expenses: Not hard at all  Food Insecurity: No Food Insecurity (02/22/2023)   Hunger Vital Sign    Worried About Running Out of Food in the Last Year: Never true    Ran Out of Food in the Last Year: Never true  Transportation Needs: No Transportation Needs (02/22/2023)   PRAPARE - Administrator, Civil Service (Medical): No    Lack of Transportation (Non-Medical): No  Physical Activity: Inactive (02/22/2023)   Exercise Vital Sign    Days of Exercise per Week: 0 days    Minutes of Exercise per Session: 40 min  Stress: No Stress Concern Present (02/22/2023)   [employer] of Occupational Health - Occupational Stress Questionnaire    Feeling of Stress : Only a little  Social Connections: Socially Integrated (02/22/2023)   Social Connection and Isolation Panel [NHANES]    Frequency of Communication with Friends and Family: More than three times a week    Frequency of Social Gatherings with Friends and Family: Three times a week    Attends Religious Services: 1 to 4 times per year    Active Member of Clubs or Organizations: Yes    Attends Banker Meetings: 1 to 4 times per year    Marital Status: Married  Catering manager Violence: Not At Risk (12/17/2022)   Humiliation, Afraid, Rape, and Kick questionnaire    Fear of Current or Ex-Partner: No    Emotionally Abused: No    Physically Abused: No    Sexually Abused: No     Allergies  Allergen Reactions   Latex Hives   Morphine And Codeine Swelling     Outpatient Medications Prior to Visit  Medication Sig Dispense Refill    albuterol  (VENTOLIN  HFA) 108 (90 Base) MCG/ACT inhaler TAKE 2 PUFFS BY MOUTH EVERY 6 HOURS AS NEEDED FOR WHEEZE OR SHORTNESS OF BREATH 8.5 each 2   Apoaequorin (PREVAGEN) 10 MG CAPS Take 1 tablet by mouth daily.     cetirizine (ZYRTEC) 10 MG chewable tablet Chew 10 mg by mouth daily.     diphenhydrAMINE (BENADRYL) 25 MG tablet Take 50 mg by mouth at bedtime.     fluticasone  (FLONASE ) 50 MCG/ACT nasal spray Place 1 spray into both nostrils daily. 16  g 2   Fluticasone -Umeclidin-Vilant (TRELEGY ELLIPTA ) 100-62.5-25 MCG/ACT AEPB Inhale 1 puff into the lungs daily. 1 each 11   montelukast  (SINGULAIR ) 10 MG tablet TAKE 1 TABLET BY MOUTH EVERYDAY AT BEDTIME 30 tablet 3   promethazine  (PHENERGAN ) 25 MG suppository PLACE 1 SUPPOSITORY (25 MG TOTAL) RECTALLY EVERY 8 (EIGHT) HOURS AS NEEDED FOR NAUSEA OR VOMITING. 12 suppository 0   rosuvastatin  (CRESTOR ) 20 MG tablet TAKE 1 TABLET BY MOUTH EVERY DAY 90 tablet 3   Vitamin D , Ergocalciferol , (DRISDOL ) 1.25 MG (50000 UNIT) CAPS capsule TAKE 1 CAPSULE (50,000 UNITS TOTAL) BY MOUTH EVERY 7 (SEVEN) DAYS 12 capsule 0   No facility-administered medications prior to visit.   Review of Systems  Constitutional:  Negative for chills, fever, malaise/fatigue and weight loss.  HENT:  Negative for congestion, sinus pain and sore throat.   Eyes: Negative.   Respiratory:  Positive for shortness of breath. Negative for cough, hemoptysis, sputum production and wheezing.   Cardiovascular:  Negative for chest pain, palpitations, orthopnea, claudication and leg swelling.  Gastrointestinal:  Negative for abdominal pain, heartburn, nausea and vomiting.  Genitourinary: Negative.   Musculoskeletal:  Negative for joint pain and myalgias.  Skin:  Negative for rash.  Neurological:  Negative for weakness.  Endo/Heme/Allergies: Negative.   Psychiatric/Behavioral: Negative.      Objective:   Vitals:   09/22/23 1046  BP: 116/62  Pulse: (!) 106  SpO2: 92%  Weight: 225 lb  (102.1 kg)  Height: 5\' 7"  (1.702 m)    Physical Exam Constitutional:      General: She is not in acute distress.    Appearance: Normal appearance.  Eyes:     General: No scleral icterus.    Conjunctiva/sclera: Conjunctivae normal.  Cardiovascular:     Rate and Rhythm: Normal rate and regular rhythm.  Pulmonary:     Breath sounds: No wheezing, rhonchi or rales.  Musculoskeletal:     Right lower leg: No edema.     Left lower leg: No edema.  Skin:    General: Skin is warm and dry.  Neurological:     General: No focal deficit present.    Ambulatory Pulse Oximetry   Row Name 09/22/23 1135      Resting  Resting Heart Rate 90      Resting Sp02 92      Lap 1 (250 feet)  HR 99      02 Sat 84      Lap 3 (250 feet)  Tech Comments: Patient did not walk as soon as she stood up O2 dropped to 84% plaved on POC 2-3L O2 went back upto 92 %        CBC    Component Value Date/Time   WBC 5.4 08/19/2016 1043   RBC 4.99 08/19/2016 1043   HGB 14.6 08/19/2016 1043   HCT 44.7 08/19/2016 1043   PLT 233.0 08/19/2016 1043   MCV 89.6 08/19/2016 1043   MCHC 32.6 08/19/2016 1043   RDW 14.3 08/19/2016 1043   LYMPHSABS 1.5 12/25/2008 0000   MONOABS 0.3 12/25/2008 0000   EOSABS 0.0 12/25/2008 0000   BASOSABS 0.0 12/25/2008 0000      Latest Ref Rng & Units 03/01/2023   11:35 AM 08/30/2022   11:25 AM 01/06/2022   10:12 AM  BMP  Glucose 70 - 99 mg/dL 93  90  629   BUN 6 - 23 mg/dL 14  18  21    Creatinine 0.40 - 1.20 mg/dL 5.28  4.13  0.79   Sodium 135 - 145 mEq/L 143  141  143   Potassium 3.5 - 5.1 mEq/L 4.6  4.9  5.1   Chloride 96 - 112 mEq/L 105  103  104   CO2 19 - 32 mEq/L 30  30  32   Calcium  8.4 - 10.5 mg/dL 9.2  9.3  9.8    Chest imaging: CT Chest wo Contrast 03/07/23 1. No suspicious pulmonary nodules. Recommend return to annual lung cancer screening CT. 2. Aortic atherosclerosis (ICD10-I70.0). Coronary artery calcification. 3. Enlarged pulmonic trunk, indicative of  pulmonary arterial hypertension.   PFT:    Latest Ref Rng & Units 07/29/2022    3:47 PM  PFT Results  FVC-Pre L 2.26   FVC-Predicted Pre % 70   FVC-Post L 2.48   FVC-Predicted Post % 77   Pre FEV1/FVC % % 53   Post FEV1/FCV % % 56   FEV1-Pre L 1.21   FEV1-Predicted Pre % 50   FEV1-Post L 1.39   DLCO uncorrected ml/min/mmHg 12.86   DLCO UNC% % 60   DLCO corrected ml/min/mmHg 12.86   DLCO COR %Predicted % 60   DLVA Predicted % 70   TLC L 5.89   TLC % Predicted % 107   RV % Predicted % 142     Labs:  Path:  Echo:  Heart Catheterization:     Assessment & Plan:   No diagnosis found.  Discussion: Kelly Henderson is a 79 year old woman, former smoker with history of emphysema/COPD and pulmonary nodules who returns to pulmonary clinic.   COPD with exacerbation -Continue Trelegy 1 puff daily - use albuterol  inhaler as needed - 40mg  prednisone  daily for 5 days - Zpak  Mild obstructive sleep apnea Mild obstructive sleep apnea confirmed by sleep study. CPAP therapy discussed.She agreed to try CPAP. - Order CPAP machine with auto-titrating 5-15cmH2O - Arrange mask fitting session with respiratory therapist. - Will order nocturnal oximitry on CPAP to determine need for nocturnal oxygen.  Follow up in 3 months or call for an appointment 1 month after starting CPAP for compliance report  Duaine German, MD [facility] Office: [phone]   Current Outpatient Medications:    albuterol  (VENTOLIN  HFA) 108 (90 Base) MCG/ACT inhaler, TAKE 2 PUFFS BY MOUTH EVERY 6 HOURS AS NEEDED FOR WHEEZE OR SHORTNESS OF BREATH, Disp: 8.5 each, Rfl: 2   Apoaequorin (PREVAGEN) 10 MG CAPS, Take 1 tablet by mouth daily., Disp: , Rfl:    cetirizine (ZYRTEC) 10 MG chewable tablet, Chew 10 mg by mouth daily., Disp: , Rfl:    diphenhydrAMINE (BENADRYL) 25 MG tablet, Take 50 mg by mouth at bedtime., Disp: , Rfl:    fluticasone  (FLONASE ) 50 MCG/ACT nasal spray, Place 1  spray into both nostrils daily., Disp: 16 g, Rfl: 2   Fluticasone -Umeclidin-Vilant (TRELEGY ELLIPTA ) 100-62.5-25 MCG/ACT AEPB, Inhale 1 puff into the lungs daily., Disp: 1 each, Rfl: 11   montelukast  (SINGULAIR ) 10 MG tablet, TAKE 1 TABLET BY MOUTH EVERYDAY AT BEDTIME, Disp: 30 tablet, Rfl: 3   promethazine  (PHENERGAN ) 25 MG suppository, PLACE 1 SUPPOSITORY (25 MG TOTAL) RECTALLY EVERY 8 (EIGHT) HOURS AS NEEDED FOR NAUSEA OR VOMITING., Disp: 12 suppository, Rfl: 0   rosuvastatin  (CRESTOR ) 20 MG tablet, TAKE 1 TABLET BY MOUTH EVERY DAY, Disp: 90 tablet, Rfl: 3   Vitamin D , Ergocalciferol , (DRISDOL ) 1.25 MG (50000 UNIT) CAPS capsule, TAKE 1 CAPSULE (50,000 UNITS TOTAL) BY MOUTH EVERY 7 (SEVEN) DAYS, Disp: 12 capsule, Rfl:  0   

## 2023-09-22 NOTE — Patient Instructions (Addendum)
 Continue trelegy ellipta  1 puff daily - rinse mouth out after each use  Use albuterol  inhaler as needed  Continue singulair  10mg  daily at bed time  Start prednisone  40mg  daily for 5 days  Start Zpak for 5 days  We will start you on CPAP auto-titration 5-15cmH2O humidification and a face mask fitting session  Follow up in 3 months or call us  once you get the CPAP to schedule an appointment 1 month after starting it.     ATURATION QUALIFICATIONS: (This note is used to comply with regulatory documentation for home oxygen)   Patient Saturations on Room Air at Rest = 92%   Patient Saturations on Room Air while Ambulating = 84%   Patient Saturations on 3 Liters of oxygen while Ambulating = 92%   Please briefly explain why patient needs home oxygen:Patient did not walk as soon as she stood up O2 dropped to 84% plaved on POC 2-3L O2 went back upto 92 %

## 2023-10-07 ENCOUNTER — Other Ambulatory Visit: Payer: Self-pay | Admitting: Family Medicine

## 2023-10-07 DIAGNOSIS — J302 Other seasonal allergic rhinitis: Secondary | ICD-10-CM

## 2023-10-11 ENCOUNTER — Telehealth: Payer: Self-pay | Admitting: Pulmonary Disease

## 2023-10-11 ENCOUNTER — Other Ambulatory Visit: Payer: Self-pay

## 2023-10-11 DIAGNOSIS — J441 Chronic obstructive pulmonary disease with (acute) exacerbation: Secondary | ICD-10-CM

## 2023-10-11 NOTE — Telephone Encounter (Signed)
 Ok to send updated oxygen order as requested.  JD

## 2023-10-11 NOTE — Telephone Encounter (Signed)
 Order has been updated NFN

## 2023-10-11 NOTE — Telephone Encounter (Signed)
 Per Devra Fontana at [employer]-   She would need to be treated as a new start. She doesn't have anything with us . So if we can get order frequency changed to "continuous with POC" and the sats copied and paste we will be able to provide the home set up and a POC.   Dr. Diania Fortes this patient is going to need a new order-please advise

## 2023-10-12 ENCOUNTER — Telehealth: Payer: Self-pay | Admitting: Pulmonary Disease

## 2023-10-12 NOTE — Telephone Encounter (Signed)
 Updated

## 2023-10-12 NOTE — Telephone Encounter (Signed)
 Note updated

## 2023-10-12 NOTE — Telephone Encounter (Signed)
 Per Devra Fontana at [employer] are still needing to be co-signed or copied and paste into their note.

## 2023-10-14 ENCOUNTER — Encounter: Payer: Self-pay | Admitting: Pulmonary Disease

## 2023-10-17 ENCOUNTER — Telehealth: Payer: Self-pay | Admitting: Family Medicine

## 2023-10-17 NOTE — Telephone Encounter (Signed)
 PCC's- pt is asking about her o2 order with [facility]. I see that you were awaiting JD to update his note and this was done on 10/12/23

## 2023-10-17 NOTE — Telephone Encounter (Signed)
 Copied from CRM [id number]. Topic: Medicare AWV >> Oct 17, 2023  1:54 PM Juliana Ocean wrote: Reason for CRM: Called 10/14/2023 to sched AWV - NO VOICEMAIL  Rosalee Collins; Care Guide Ambulatory Clinical Support [facility] Direct Dial: [phone]

## 2023-10-18 NOTE — Telephone Encounter (Signed)
 Sent to [facility]. NFN

## 2023-10-19 DIAGNOSIS — J449 Chronic obstructive pulmonary disease, unspecified: Secondary | ICD-10-CM | POA: Diagnosis not present

## 2023-11-16 ENCOUNTER — Ambulatory Visit: Admitting: Family Medicine

## 2023-11-16 ENCOUNTER — Encounter: Payer: Self-pay | Admitting: Family Medicine

## 2023-11-16 ENCOUNTER — Other Ambulatory Visit (HOSPITAL_BASED_OUTPATIENT_CLINIC_OR_DEPARTMENT_OTHER): Payer: Self-pay

## 2023-11-16 ENCOUNTER — Other Ambulatory Visit: Payer: Self-pay | Admitting: Family Medicine

## 2023-11-16 VITALS — BP 118/68 | HR 81 | Temp 98.0°F | Resp 16 | Ht 67.0 in | Wt 227.0 lb

## 2023-11-16 DIAGNOSIS — Z6835 Body mass index (BMI) 35.0-35.9, adult: Secondary | ICD-10-CM

## 2023-11-16 DIAGNOSIS — E669 Obesity, unspecified: Secondary | ICD-10-CM | POA: Diagnosis not present

## 2023-11-16 MED ORDER — ZEPBOUND 7.5 MG/0.5ML ~~LOC~~ SOAJ
7.5000 mg | SUBCUTANEOUS | 0 refills | Status: DC
  Filled 2023-11-16: qty 2, 28d supply, fill #0

## 2023-11-16 MED ORDER — ZEPBOUND 10 MG/0.5ML ~~LOC~~ SOAJ
10.0000 mg | SUBCUTANEOUS | 0 refills | Status: DC
  Filled 2023-11-16: qty 2, 28d supply, fill #0

## 2023-11-16 MED ORDER — ZEPBOUND 5 MG/0.5ML ~~LOC~~ SOAJ: 5.0000 mg | SUBCUTANEOUS | 0 refills | Status: DC

## 2023-11-16 MED ORDER — TIRZEPATIDE-WEIGHT MANAGEMENT 2.5 MG/0.5ML ~~LOC~~ SOLN
2.5000 mg | SUBCUTANEOUS | 0 refills | Status: DC
Start: 2023-11-16 — End: 2023-12-09

## 2023-11-16 MED ORDER — ZEPBOUND 2.5 MG/0.5ML ~~LOC~~ SOAJ: 2.5000 mg | SUBCUTANEOUS | 0 refills | Status: DC

## 2023-11-16 MED ORDER — ZEPBOUND 12.5 MG/0.5ML ~~LOC~~ SOAJ: 12.5000 mg | SUBCUTANEOUS | 0 refills | Status: DC

## 2023-11-16 MED ORDER — ZEPBOUND 2.5 MG/0.5ML ~~LOC~~ SOAJ
2.5000 mg | SUBCUTANEOUS | 0 refills | Status: DC
  Filled 2023-11-16: qty 2, 28d supply, fill #0

## 2023-11-16 MED ORDER — ZEPBOUND 15 MG/0.5ML ~~LOC~~ SOAJ: 15.0000 mg | SUBCUTANEOUS | 1 refills | Status: DC

## 2023-11-16 MED ORDER — ZEPBOUND 7.5 MG/0.5ML ~~LOC~~ SOAJ: 7.5000 mg | SUBCUTANEOUS | 0 refills | Status: DC

## 2023-11-16 MED ORDER — ZEPBOUND 12.5 MG/0.5ML ~~LOC~~ SOAJ
12.5000 mg | SUBCUTANEOUS | 0 refills | Status: DC
  Filled 2023-11-16: qty 2, 28d supply, fill #0

## 2023-11-16 MED ORDER — ZEPBOUND 15 MG/0.5ML ~~LOC~~ SOAJ
15.0000 mg | SUBCUTANEOUS | 1 refills | Status: DC
  Filled 2023-11-16: qty 6, 84d supply, fill #0

## 2023-11-16 MED ORDER — ZEPBOUND 5 MG/0.5ML ~~LOC~~ SOAJ
5.0000 mg | SUBCUTANEOUS | 0 refills | Status: DC
  Filled 2023-11-16: qty 2, 28d supply, fill #0

## 2023-11-16 MED ORDER — ZEPBOUND 10 MG/0.5ML ~~LOC~~ SOAJ: 10.0000 mg | SUBCUTANEOUS | 0 refills | Status: DC

## 2023-11-16 NOTE — Patient Instructions (Addendum)
 Foods that may reduce pain: 1) Ginger 2) Blueberries 3) Salmon 4) Pumpkin seeds 5) Dark chocolate 6) Turmeric 7) Tart cherries 8) Virgin olive oil 9) Chili peppers 10) Mint 11) Krill oil  Keep the diet clean and stay active.  Aim to do some physical exertion for 150 minutes per week. This is typically divided into 5 days per week, 30 minutes per day. The activity should be enough to get your heart rate up. Anything is better than nothing if you have time constraints.  Please consider adding some weight resistance exercise to your routine. Consider yoga as well.   Call Center for [facility] at [phone] for an appointment.  They are located at [address], [place], [place], [address] (right across the hall from our office).  Let us  know if you need anything.

## 2023-11-16 NOTE — Progress Notes (Signed)
 Chief Complaint  Patient presents with   Weight Loss    Discuss Weight     Subjective: Patient is a 78 y.o. female here for weight discussion.  Patient has a history of obesity.  She has seen weight watchers and continues to follow with them.  Diet could be better.  She does not exercise routinely.  Her weight is affecting her joints and breathing.  She is interested in going on medication to help.  Past Medical History:  Diagnosis Date   Allergy morphine 1986   Aortic atherosclerosis (HCC)    CT 2019   Emphysema of lung (HCC)    History of chicken pox    History of shingles    Osteoporosis     Objective: BP 118/68 (BP Location: Left Arm, Patient Position: Sitting)   Pulse 81   Temp 98 F (36.7 C) (Oral)   Resp 16   Ht 5' 7 (1.702 m)   Wt 227 lb (103 kg)   SpO2 95%   BMI 35.55 kg/m  General: Awake, appears stated age Heart: RRR, no LE edema Lungs: CTAB, no rales, wheezes or rhonchi. No accessory muscle use Psych: Age appropriate judgment and insight, normal affect and mood  Assessment and Plan: Obesity (BMI 30-39.9) -Zepbound  2.5 mg daily, vials sent to [facility] Chronic issue, not controlled.  Counseled on diet and exercise.  Continue with weight watchers.  Follow-up in 3 months if able to get on the medication. The patient voiced understanding and agreement to the plan.  Mabel Mt [place], DO 11/16/23  2:23 PM

## 2023-11-18 DIAGNOSIS — J449 Chronic obstructive pulmonary disease, unspecified: Secondary | ICD-10-CM | POA: Diagnosis not present

## 2023-12-08 ENCOUNTER — Other Ambulatory Visit: Payer: Self-pay | Admitting: Family Medicine

## 2023-12-12 DIAGNOSIS — H25813 Combined forms of age-related cataract, bilateral: Secondary | ICD-10-CM | POA: Diagnosis not present

## 2023-12-19 DIAGNOSIS — J449 Chronic obstructive pulmonary disease, unspecified: Secondary | ICD-10-CM | POA: Diagnosis not present

## 2023-12-20 ENCOUNTER — Other Ambulatory Visit: Payer: Self-pay | Admitting: Family Medicine

## 2023-12-28 ENCOUNTER — Other Ambulatory Visit: Payer: Self-pay | Admitting: Family Medicine

## 2024-01-03 DIAGNOSIS — H2511 Age-related nuclear cataract, right eye: Secondary | ICD-10-CM | POA: Diagnosis not present

## 2024-01-11 DIAGNOSIS — H2512 Age-related nuclear cataract, left eye: Secondary | ICD-10-CM | POA: Diagnosis not present

## 2024-01-17 DIAGNOSIS — H2512 Age-related nuclear cataract, left eye: Secondary | ICD-10-CM | POA: Diagnosis not present

## 2024-01-19 DIAGNOSIS — J449 Chronic obstructive pulmonary disease, unspecified: Secondary | ICD-10-CM | POA: Diagnosis not present

## 2024-02-07 NOTE — Telephone Encounter (Signed)
 This has been taken care of.

## 2024-02-14 ENCOUNTER — Other Ambulatory Visit: Payer: Self-pay | Admitting: Family Medicine

## 2024-02-14 DIAGNOSIS — J302 Other seasonal allergic rhinitis: Secondary | ICD-10-CM

## 2024-02-18 DIAGNOSIS — J449 Chronic obstructive pulmonary disease, unspecified: Secondary | ICD-10-CM | POA: Diagnosis not present

## 2024-02-20 ENCOUNTER — Encounter: Payer: Self-pay | Admitting: Family Medicine

## 2024-02-20 ENCOUNTER — Ambulatory Visit: Admitting: Family Medicine

## 2024-02-20 VITALS — BP 122/70 | HR 65 | Temp 98.0°F | Resp 16 | Ht 67.0 in | Wt 213.2 lb

## 2024-02-20 DIAGNOSIS — Z6833 Body mass index (BMI) 33.0-33.9, adult: Secondary | ICD-10-CM | POA: Diagnosis not present

## 2024-02-20 DIAGNOSIS — E669 Obesity, unspecified: Secondary | ICD-10-CM | POA: Diagnosis not present

## 2024-02-20 NOTE — Progress Notes (Signed)
 Chief Complaint  Patient presents with   Follow-up    Follow Up    Subjective: Patient is a 78 y.o. female here for f/u.  Started on Zepbound  2.5 mg/week for 2 weeks. Lost 20 lbs on it. Exercise is limited, she is active with her great grandson. Diet is much better. Less cravings.    Past Medical History:  Diagnosis Date   Allergy morphine 1986   Aortic atherosclerosis    CT 2019   Emphysema of lung (HCC)    History of chicken pox    History of shingles    Osteoporosis     Objective: BP 122/70 (BP Location: Left Arm, Patient Position: Sitting)   Pulse 65   Temp 98 F (36.7 C) (Oral)   Resp 16   Ht 5' 7 (1.702 m)   Wt 213 lb 3.2 oz (96.7 kg)   SpO2 96%   BMI 33.39 kg/m  General: Awake, appears stated age Lungs: No accessory muscle use Psych: Age appropriate judgment and insight, normal affect and mood  Assessment and Plan: Obesity (BMI 30-39.9)  She will resume her tirzepatide  at 2.5 mg weekly.  After 2 doses she had lost 20 pounds.  We may keep her at the 2.5 mg dosage for a while, especially because she is paying out-of-pocket.  Counseled on diet and exercise.  Follow-up in 3 months for medication check. The patient voiced understanding and agreement to the plan.  Mabel Mt [place], DO 02/20/24  11:33 AM

## 2024-02-20 NOTE — Patient Instructions (Signed)
 Strong work with your weight loss.  Heat (pad or rice pillow in microwave) over affected area, 10-15 minutes twice daily.   OK to take Tylenol 1000 mg (2 extra strength tabs) or 975 mg (3 regular strength tabs) every 6 hours as needed.  Let us  know if you need anything.  EXERCISES  RANGE OF MOTION (ROM) AND STRETCHING EXERCISES - Low Back Pain Most people with lower back pain will find that their symptoms get worse with excessive bending forward (flexion) or arching at the lower back (extension). The exercises that will help resolve your symptoms will focus on the opposite motion.  If you have pain, numbness or tingling which travels down into your buttocks, leg or foot, the goal of the therapy is for these symptoms to move closer to your back and eventually resolve. Sometimes, these leg symptoms will get better, but your lower back pain may worsen. This is often an indication of progress in your rehabilitation. Be very alert to any changes in your symptoms and the activities in which you participated in the 24 hours prior to the change. Sharing this information with your caregiver will allow him or her to most efficiently treat your condition. These exercises may help you when beginning to rehabilitate your injury. Your symptoms may resolve with or without further involvement from your physician, physical therapist or athletic trainer. While completing these exercises, remember:  Restoring tissue flexibility helps normal motion to return to the joints. This allows healthier, less painful movement and activity. An effective stretch should be held for at least 30 seconds. A stretch should never be painful. You should only feel a gentle lengthening or release in the stretched tissue. FLEXION RANGE OF MOTION AND STRETCHING EXERCISES:  STRETCH - Flexion, Single Knee to Chest  Lie on a firm bed or floor with both legs extended in front of you. Keeping one leg in contact with the floor, bring your  opposite knee to your chest. Hold your leg in place by either grabbing behind your thigh or at your knee. Pull until you feel a gentle stretch in your low back. Hold 30 seconds. Slowly release your grasp and repeat the exercise with the opposite side. Repeat 2 times. Complete this exercise 3 times per week.   STRETCH - Flexion, Double Knee to Chest Lie on a firm bed or floor with both legs extended in front of you. Keeping one leg in contact with the floor, bring your opposite knee to your chest. Tense your stomach muscles to support your back and then lift your other knee to your chest. Hold your legs in place by either grabbing behind your thighs or at your knees. Pull both knees toward your chest until you feel a gentle stretch in your low back. Hold 30 seconds. Tense your stomach muscles and slowly return one leg at a time to the floor. Repeat 2 times. Complete this exercise 3 times per week.   STRETCH - Low Trunk Rotation Lie on a firm bed or floor. Keeping your legs in front of you, bend your knees so they are both pointed toward the ceiling and your feet are flat on the floor. Extend your arms out to the side. This will stabilize your upper body by keeping your shoulders in contact with the floor. Gently and slowly drop both knees together to one side until you feel a gentle stretch in your low back. Hold for 30 seconds. Tense your stomach muscles to support your lower back as you bring  your knees back to the starting position. Repeat the exercise to the other side. Repeat 2 times. Complete this exercise at least 3 times per week.   EXTENSION RANGE OF MOTION AND FLEXIBILITY EXERCISES:  STRETCH - Extension, Prone on Elbows  Lie on your stomach on the floor, a bed will be too soft. Place your palms about shoulder width apart and at the height of your head. Place your elbows under your shoulders. If this is too painful, stack pillows under your chest. Allow your body to relax so that  your hips drop lower and make contact more completely with the floor. Hold this position for 30 seconds. Slowly return to lying flat on the floor. Repeat 2 times. Complete this exercise 3 times per week.   RANGE OF MOTION - Extension, Prone Press Ups Lie on your stomach on the floor, a bed will be too soft. Place your palms about shoulder width apart and at the height of your head. Keeping your back as relaxed as possible, slowly straighten your elbows while keeping your hips on the floor. You may adjust the placement of your hands to maximize your comfort. As you gain motion, your hands will come more underneath your shoulders. Hold this position 30 seconds. Slowly return to lying flat on the floor. Repeat 2 times. Complete this exercise 3 times per week.   RANGE OF MOTION- Quadruped, Neutral Spine  Assume a hands and knees position on a firm surface. Keep your hands under your shoulders and your knees under your hips. You may place padding under your knees for comfort. Drop your head and point your tailbone toward the ground below you. This will round out your lower back like an angry cat. Hold this position for 30 seconds. Slowly lift your head and release your tail bone so that your back sags into a large arch, like an old horse. Hold this position for 30 seconds. Repeat this until you feel limber in your low back. Now, find your sweet spot. This will be the most comfortable position somewhere between the two previous positions. This is your neutral spine. Once you have found this position, tense your stomach muscles to support your low back. Hold this position for 30 seconds. Repeat 2 times. Complete this exercise 3 times per week.   STRENGTHENING EXERCISES - Low Back Sprain These exercises may help you when beginning to rehabilitate your injury. These exercises should be done near your sweet spot. This is the neutral, low-back arch, somewhere between fully rounded and fully arched,  that is your least painful position. When performed in this safe range of motion, these exercises can be used for people who have either a flexion or extension based injury. These exercises may resolve your symptoms with or without further involvement from your physician, physical therapist or athletic trainer. While completing these exercises, remember:  Muscles can gain both the endurance and the strength needed for everyday activities through controlled exercises. Complete these exercises as instructed by your physician, physical therapist or athletic trainer. Increase the resistance and repetitions only as guided. You may experience muscle soreness or fatigue, but the pain or discomfort you are trying to eliminate should never worsen during these exercises. If this pain does worsen, stop and make certain you are following the directions exactly. If the pain is still present after adjustments, discontinue the exercise until you can discuss the trouble with your caregiver.  STRENGTHENING - Deep Abdominals, Pelvic Tilt  Lie on a firm bed or floor.  Keeping your legs in front of you, bend your knees so they are both pointed toward the ceiling and your feet are flat on the floor. Tense your lower abdominal muscles to press your low back into the floor. This motion will rotate your pelvis so that your tail bone is scooping upwards rather than pointing at your feet or into the floor. With a gentle tension and even breathing, hold this position for 3 seconds. Repeat 2 times. Complete this exercise 3 times per week.   STRENGTHENING - Abdominals, Crunches  Lie on a firm bed or floor. Keeping your legs in front of you, bend your knees so they are both pointed toward the ceiling and your feet are flat on the floor. Cross your arms over your chest. Slightly tip your chin down without bending your neck. Tense your abdominals and slowly lift your trunk high enough to just clear your shoulder blades. Lifting higher  can put excessive stress on the lower back and does not further strengthen your abdominal muscles. Control your return to the starting position. Repeat 2 times. Complete this exercise 3 times per week.   STRENGTHENING - Quadruped, Opposite UE/LE Lift  Assume a hands and knees position on a firm surface. Keep your hands under your shoulders and your knees under your hips. You may place padding under your knees for comfort. Find your neutral spine and gently tense your abdominal muscles so that you can maintain this position. Your shoulders and hips should form a rectangle that is parallel with the floor and is not twisted. Keeping your trunk steady, lift your right hand no higher than your shoulder and then your left leg no higher than your hip. Make sure you are not holding your breath. Hold this position for 30 seconds. Continuing to keep your abdominal muscles tense and your back steady, slowly return to your starting position. Repeat with the opposite arm and leg. Repeat 2 times. Complete this exercise 3 times per week.   STRENGTHENING - Abdominals and Quadriceps, Straight Leg Raise  Lie on a firm bed or floor with both legs extended in front of you. Keeping one leg in contact with the floor, bend the other knee so that your foot can rest flat on the floor. Find your neutral spine, and tense your abdominal muscles to maintain your spinal position throughout the exercise. Slowly lift your straight leg off the floor about 6 inches for a count of 3, making sure to not hold your breath. Still keeping your neutral spine, slowly lower your leg all the way to the floor. Repeat this exercise with each leg 2 times. Complete this exercise 3 times per week.  POSTURE AND BODY MECHANICS CONSIDERATIONS - Low Back Sprain Keeping correct posture when sitting, standing or completing your activities will reduce the stress put on different body tissues, allowing injured tissues a chance to heal and limiting  painful experiences. The following are general guidelines for improved posture.  While reading these guidelines, remember: The exercises prescribed by your provider will help you have the flexibility and strength to maintain correct postures. The correct posture provides the best environment for your joints to work. All of your joints have less wear and tear when properly supported by a spine with good posture. This means you will experience a healthier, less painful body. Correct posture must be practiced with all of your activities, especially prolonged sitting and standing. Correct posture is as important when doing repetitive low-stress activities (typing) as it is when  doing a single heavy-load activity (lifting).  RESTING POSITIONS Consider which positions are most painful for you when choosing a resting position. If you have pain with flexion-based activities (sitting, bending, stooping, squatting), choose a position that allows you to rest in a less flexed posture. You would want to avoid curling into a fetal position on your side. If your pain worsens with extension-based activities (prolonged standing, working overhead), avoid resting in an extended position such as sleeping on your stomach. Most people will find more comfort when they rest with their spine in a more neutral position, neither too rounded nor too arched. Lying on a non-sagging bed on your side with a pillow between your knees, or on your back with a pillow under your knees will often provide some relief. Keep in mind, being in any one position for a prolonged period of time, no matter how correct your posture, can still lead to stiffness.  PROPER SITTING POSTURE In order to minimize stress and discomfort on your spine, you must sit with correct posture. Sitting with good posture should be effortless for a healthy body. Returning to good posture is a gradual process. Many people can work toward this most comfortably by using various  supports until they have the flexibility and strength to maintain this posture on their own. When sitting with proper posture, your ears will fall over your shoulders and your shoulders will fall over your hips. You should use the back of the chair to support your upper back. Your lower back will be in a neutral position, just slightly arched. You may place a small pillow or folded towel at the base of your lower back for  support.  When working at a desk, create an environment that supports good, upright posture. Without extra support, muscles tire, which leads to excessive strain on joints and other tissues. Keep these recommendations in mind:  CHAIR: A chair should be able to slide under your desk when your back makes contact with the back of the chair. This allows you to work closely. The chair's height should allow your eyes to be level with the upper part of your monitor and your hands to be slightly lower than your elbows.  BODY POSITION Your feet should make contact with the floor. If this is not possible, use a foot rest. Keep your ears over your shoulders. This will reduce stress on your neck and low back.  INCORRECT SITTING POSTURES  If you are feeling tired and unable to assume a healthy sitting posture, do not slouch or slump. This puts excessive strain on your back tissues, causing more damage and pain. Healthier options include: Using more support, like a lumbar pillow. Switching tasks to something that requires you to be upright or walking. Talking a brief walk. Lying down to rest in a neutral-spine position.  PROLONGED STANDING WHILE SLIGHTLY LEANING FORWARD  When completing a task that requires you to lean forward while standing in one place for a long time, place either foot up on a stationary 2-4 inch high object to help maintain the best posture. When both feet are on the ground, the lower back tends to lose its slight inward curve. If this curve flattens (or becomes too  large), then the back and your other joints will experience too much stress, tire more quickly, and can cause pain.  CORRECT STANDING POSTURES Proper standing posture should be assumed with all daily activities, even if they only take a few moments, like when brushing your  teeth. As in sitting, your ears should fall over your shoulders and your shoulders should fall over your hips. You should keep a slight tension in your abdominal muscles to brace your spine. Your tailbone should point down to the ground, not behind your body, resulting in an over-extended swayback posture.   INCORRECT STANDING POSTURES  Common incorrect standing postures include a forward head, locked knees and/or an excessive swayback. WALKING Walk with an upright posture. Your ears, shoulders and hips should all line-up.  PROLONGED ACTIVITY IN A FLEXED POSITION When completing a task that requires you to bend forward at your waist or lean over a low surface, try to find a way to stabilize 3 out of 4 of your limbs. You can place a hand or elbow on your thigh or rest a knee on the surface you are reaching across. This will provide you more stability, so that your muscles do not tire as quickly. By keeping your knees relaxed, or slightly bent, you will also reduce stress across your lower back. CORRECT LIFTING TECHNIQUES  DO : Assume a wide stance. This will provide you more stability and the opportunity to get as close as possible to the object which you are lifting. Tense your abdominals to brace your spine. Bend at the knees and hips. Keeping your back locked in a neutral-spine position, lift using your leg muscles. Lift with your legs, keeping your back straight. Test the weight of unknown objects before attempting to lift them. Try to keep your elbows locked down at your sides in order get the best strength from your shoulders when carrying an object.   Always ask for help when lifting heavy or awkward objects. INCORRECT  LIFTING TECHNIQUES DO NOT:  Lock your knees when lifting, even if it is a small object. Bend and twist. Pivot at your feet or move your feet when needing to change directions. Assume that you can safely pick up even a paperclip without proper posture.

## 2024-03-12 ENCOUNTER — Other Ambulatory Visit: Payer: Self-pay | Admitting: Family Medicine

## 2024-03-19 ENCOUNTER — Other Ambulatory Visit: Payer: Self-pay | Admitting: Family Medicine

## 2024-03-19 DIAGNOSIS — I700 Atherosclerosis of aorta: Secondary | ICD-10-CM

## 2024-03-20 DIAGNOSIS — J449 Chronic obstructive pulmonary disease, unspecified: Secondary | ICD-10-CM | POA: Diagnosis not present

## 2024-04-18 ENCOUNTER — Other Ambulatory Visit: Payer: Self-pay | Admitting: Family Medicine

## 2024-05-22 ENCOUNTER — Other Ambulatory Visit (HOSPITAL_BASED_OUTPATIENT_CLINIC_OR_DEPARTMENT_OTHER): Payer: Self-pay

## 2024-05-22 ENCOUNTER — Encounter: Payer: Self-pay | Admitting: Family Medicine

## 2024-05-22 ENCOUNTER — Ambulatory Visit: Admitting: Family Medicine

## 2024-05-22 ENCOUNTER — Ambulatory Visit: Payer: Self-pay | Admitting: Family Medicine

## 2024-05-22 ENCOUNTER — Encounter (HOSPITAL_BASED_OUTPATIENT_CLINIC_OR_DEPARTMENT_OTHER): Payer: Self-pay

## 2024-05-22 VITALS — BP 126/78 | HR 89 | Temp 98.0°F | Resp 16 | Ht 67.0 in | Wt 187.0 lb

## 2024-05-22 DIAGNOSIS — E669 Obesity, unspecified: Secondary | ICD-10-CM

## 2024-05-22 DIAGNOSIS — R911 Solitary pulmonary nodule: Secondary | ICD-10-CM

## 2024-05-22 DIAGNOSIS — E7800 Pure hypercholesterolemia, unspecified: Secondary | ICD-10-CM | POA: Diagnosis not present

## 2024-05-22 DIAGNOSIS — Z87891 Personal history of nicotine dependence: Secondary | ICD-10-CM | POA: Diagnosis not present

## 2024-05-22 DIAGNOSIS — J439 Emphysema, unspecified: Secondary | ICD-10-CM | POA: Diagnosis not present

## 2024-05-22 LAB — COMPREHENSIVE METABOLIC PANEL WITH GFR
ALT: 14 U/L (ref 3–35)
AST: 17 U/L (ref 5–37)
Albumin: 4.3 g/dL (ref 3.5–5.2)
Alkaline Phosphatase: 90 U/L (ref 39–117)
BUN: 21 mg/dL (ref 6–23)
CO2: 29 meq/L (ref 19–32)
Calcium: 9.4 mg/dL (ref 8.4–10.5)
Chloride: 104 meq/L (ref 96–112)
Creatinine, Ser: 0.83 mg/dL (ref 0.40–1.20)
GFR: 68.1 mL/min
Glucose, Bld: 91 mg/dL (ref 70–99)
Potassium: 4 meq/L (ref 3.5–5.1)
Sodium: 142 meq/L (ref 135–145)
Total Bilirubin: 0.8 mg/dL (ref 0.2–1.2)
Total Protein: 7.1 g/dL (ref 6.0–8.3)

## 2024-05-22 LAB — LIPID PANEL
Cholesterol: 140 mg/dL (ref 28–200)
HDL: 63.2 mg/dL
LDL Cholesterol: 54 mg/dL (ref 10–99)
NonHDL: 76.64
Total CHOL/HDL Ratio: 2
Triglycerides: 111 mg/dL (ref 10.0–149.0)
VLDL: 22.2 mg/dL (ref 0.0–40.0)

## 2024-05-22 MED ORDER — ZEPBOUND 2.5 MG/0.5ML ~~LOC~~ SOAJ
2.5000 mg | SUBCUTANEOUS | 5 refills | Status: AC
  Filled 2024-05-22: qty 2, 28d supply, fill #0

## 2024-05-22 NOTE — Patient Instructions (Signed)
 Give us  2-3 business days to get the results of your labs back.   Keep the diet clean and stay active.  Let us  know if you need anything.

## 2024-05-22 NOTE — Progress Notes (Signed)
 Chief Complaint  Patient presents with   Medication Refill    Medication Refill    Subjective: Hyperlipidemia Patient presents for hyperlipidemia follow up. Currently taking Crestor  20 mg/d and compliance with treatment thus far has been good. She denies myalgias. She is adhering to a healthy diet. Exercise: active with her great grand (78 yr old) No CP or SOB.  The patient is known to have coexisting coronary artery disease.  COPD/Emphysema Taking Trelegy 100-62.5-25 mcg 1 puff daily. Compliant, no AE's. Uses rescue SABA never. Quit smoking in 2018.   Obesity Taking Zepbound  2.5 mg weekly. Gives her some nausea. Has lost 40 lbs since starting it. Diet/exercise as above.   Past Medical History:  Diagnosis Date   Allergy morphine 1986   Aortic atherosclerosis    CT 2019   Emphysema of lung (HCC)    History of chicken pox    History of shingles    Osteoporosis     Objective: BP 126/78 (BP Location: Left Arm, Patient Position: Sitting)   Pulse 89   Temp 98 F (36.7 C) (Oral)   Resp 16   Ht 5' 7 (1.702 m)   Wt 187 lb (84.8 kg)   SpO2 95%   BMI 29.29 kg/m  General: Awake, appears stated age HEENT: MMM Heart: RRR, no LE edema, no bruits Lungs: CTAB, no rales, wheezes or rhonchi. No accessory muscle use Psych: Age appropriate judgment and insight, normal affect and mood  Assessment and Plan: Pure hypercholesterolemia - Plan: Comprehensive metabolic panel with GFR, Lipid panel  Pulmonary emphysema (HCC)  Obesity (BMI 30-39.9)  Stopped smoking with greater than 20 pack year history - Plan: CT CHEST LUNG CANCER SCREENING LOW DOSE WO CONTRAST  Chronic, stable. Ck labs. Counseled on diet/exercise. Cont Crestor  20 mg/d.  Chronic, stable. Cont Trelegy 100-62.5-25 mcg 1 puff daily, albuterol  as needed.  Continue Zepbound  2.5 mg weekly.  She has lost 40 pounds since starting this medication. Reorder low-dose chest CT. F/u in 6 mo. The patient voiced understanding and  agreement to the plan.  Mabel Mt [place], DO 05/22/2024  12:01 PM

## 2024-05-23 ENCOUNTER — Other Ambulatory Visit (HOSPITAL_BASED_OUTPATIENT_CLINIC_OR_DEPARTMENT_OTHER): Payer: Self-pay | Admitting: Family Medicine

## 2024-05-23 DIAGNOSIS — Z1231 Encounter for screening mammogram for malignant neoplasm of breast: Secondary | ICD-10-CM

## 2024-05-24 ENCOUNTER — Other Ambulatory Visit (HOSPITAL_BASED_OUTPATIENT_CLINIC_OR_DEPARTMENT_OTHER): Payer: Self-pay

## 2024-05-31 ENCOUNTER — Encounter (HOSPITAL_BASED_OUTPATIENT_CLINIC_OR_DEPARTMENT_OTHER): Payer: Self-pay

## 2024-05-31 ENCOUNTER — Ambulatory Visit (HOSPITAL_BASED_OUTPATIENT_CLINIC_OR_DEPARTMENT_OTHER)
Admission: RE | Admit: 2024-05-31 | Discharge: 2024-05-31 | Disposition: A | Discharge status: Home / Self Care | Source: Ambulatory Visit | Attending: Family Medicine | Admitting: Family Medicine

## 2024-05-31 ENCOUNTER — Other Ambulatory Visit (HOSPITAL_BASED_OUTPATIENT_CLINIC_OR_DEPARTMENT_OTHER): Payer: Self-pay | Admitting: Family Medicine

## 2024-05-31 DIAGNOSIS — Z1231 Encounter for screening mammogram for malignant neoplasm of breast: Secondary | ICD-10-CM | POA: Insufficient documentation

## 2024-05-31 DIAGNOSIS — Z87891 Personal history of nicotine dependence: Secondary | ICD-10-CM | POA: Insufficient documentation

## 2024-06-05 ENCOUNTER — Telehealth: Payer: Self-pay

## 2024-06-05 NOTE — Telephone Encounter (Signed)
 [facility] calling to make sure report in chart and review #1 impression. Advised I'd send to provider.  IMPRESSION: 1. Lung-RADS 3, probably benign findings. Short-term follow-up in 6 months is recommended with repeat low-dose chest CT without contrast (please use the following order, CT CHEST LCS NODULE FOLLOW-UP W/O CM). 4.2 mm right lower lobe pulmonary nodule is new in the interval since 03/07/2023.  Copied from CRM #[id number]. Topic: Clinical - Lab/Test Results >> Jun 05, 2024  9:19 AM Rea BROCKS wrote: Reason for CRM: Channing is calling from [facility] to give critical value report on Ct Chest lung screening. CAL is not open until 10. Wt to NT.
